# Patient Record
Sex: Female | Born: 1990 | Race: Black or African American | Hispanic: No | Marital: Single | Smoking: Current every day smoker
Health system: Southern US, Community
[De-identification: ages and names within clinical notes are randomized; demographics above are authoritative.]

## PROBLEM LIST (undated history)

## (undated) ENCOUNTER — Inpatient Hospital Stay (HOSPITAL_COMMUNITY): Payer: Self-pay

## (undated) ENCOUNTER — Inpatient Hospital Stay: Payer: Self-pay | Admitting: Maternal Newborn

## (undated) DIAGNOSIS — Z8619 Personal history of other infectious and parasitic diseases: Secondary | ICD-10-CM

## (undated) DIAGNOSIS — F1010 Alcohol abuse, uncomplicated: Secondary | ICD-10-CM

## (undated) DIAGNOSIS — N135 Crossing vessel and stricture of ureter without hydronephrosis: Secondary | ICD-10-CM

## (undated) DIAGNOSIS — R87619 Unspecified abnormal cytological findings in specimens from cervix uteri: Secondary | ICD-10-CM

## (undated) DIAGNOSIS — F1290 Cannabis use, unspecified, uncomplicated: Secondary | ICD-10-CM

## (undated) DIAGNOSIS — D696 Thrombocytopenia, unspecified: Secondary | ICD-10-CM

## (undated) DIAGNOSIS — N200 Calculus of kidney: Secondary | ICD-10-CM

## (undated) DIAGNOSIS — M5412 Radiculopathy, cervical region: Secondary | ICD-10-CM

## (undated) DIAGNOSIS — O1420 HELLP syndrome (HELLP), unspecified trimester: Secondary | ICD-10-CM

## (undated) DIAGNOSIS — D649 Anemia, unspecified: Secondary | ICD-10-CM

## (undated) DIAGNOSIS — I10 Essential (primary) hypertension: Secondary | ICD-10-CM

## (undated) DIAGNOSIS — F419 Anxiety disorder, unspecified: Secondary | ICD-10-CM

## (undated) DIAGNOSIS — G43909 Migraine, unspecified, not intractable, without status migrainosus: Secondary | ICD-10-CM

## (undated) DIAGNOSIS — F17210 Nicotine dependence, cigarettes, uncomplicated: Secondary | ICD-10-CM

## (undated) HISTORY — PX: ECTOPIC PREGNANCY SURGERY: SHX613

## (undated) HISTORY — PX: FRACTURE SURGERY: SHX138

## (undated) HISTORY — DX: Migraine, unspecified, not intractable, without status migrainosus: G43.909

## (undated) HISTORY — DX: Unspecified abnormal cytological findings in specimens from cervix uteri: R87.619

## (undated) HISTORY — DX: Thrombocytopenia, unspecified: D69.6

## (undated) HISTORY — DX: Personal history of other infectious and parasitic diseases: Z86.19

---
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
## 2011-03-08 ENCOUNTER — Emergency Department: Payer: Self-pay | Admitting: Emergency Medicine

## 2011-04-29 ENCOUNTER — Emergency Department: Payer: Self-pay | Admitting: Emergency Medicine

## 2011-05-11 ENCOUNTER — Inpatient Hospital Stay: Payer: Self-pay | Admitting: Surgery

## 2011-05-14 LAB — PATHOLOGY REPORT

## 2011-05-20 ENCOUNTER — Encounter: Payer: Self-pay | Admitting: Obstetrics and Gynecology

## 2011-06-01 ENCOUNTER — Encounter: Payer: Self-pay | Admitting: Pediatrics

## 2011-06-03 ENCOUNTER — Encounter: Payer: Self-pay | Admitting: Obstetrics and Gynecology

## 2011-06-15 ENCOUNTER — Encounter: Payer: Self-pay | Admitting: Pediatric Cardiology

## 2011-06-29 ENCOUNTER — Encounter: Payer: Self-pay | Admitting: Pediatrics

## 2011-06-30 ENCOUNTER — Observation Stay: Payer: Self-pay | Admitting: Obstetrics and Gynecology

## 2011-07-06 ENCOUNTER — Observation Stay: Payer: Self-pay | Admitting: Obstetrics and Gynecology

## 2011-07-12 DIAGNOSIS — D696 Thrombocytopenia, unspecified: Secondary | ICD-10-CM | POA: Insufficient documentation

## 2011-07-13 ENCOUNTER — Encounter: Payer: Self-pay | Admitting: Pediatrics

## 2011-07-19 ENCOUNTER — Encounter: Payer: Self-pay | Admitting: Maternal and Fetal Medicine

## 2011-07-27 ENCOUNTER — Encounter: Payer: Self-pay | Admitting: Pediatric Cardiology

## 2011-08-14 ENCOUNTER — Observation Stay: Payer: Self-pay

## 2011-08-15 ENCOUNTER — Ambulatory Visit: Payer: Self-pay

## 2011-08-26 ENCOUNTER — Encounter: Payer: Self-pay | Admitting: Obstetrics & Gynecology

## 2011-09-03 ENCOUNTER — Observation Stay: Payer: Self-pay

## 2011-09-17 ENCOUNTER — Observation Stay: Payer: Self-pay | Admitting: Obstetrics and Gynecology

## 2011-09-22 ENCOUNTER — Inpatient Hospital Stay: Payer: Self-pay

## 2012-01-15 ENCOUNTER — Emergency Department: Payer: Self-pay | Admitting: *Deleted

## 2012-05-29 ENCOUNTER — Emergency Department: Payer: Self-pay | Admitting: Emergency Medicine

## 2012-07-29 ENCOUNTER — Emergency Department: Payer: Self-pay | Admitting: Emergency Medicine

## 2012-07-29 LAB — COMPREHENSIVE METABOLIC PANEL WITH GFR
Albumin: 3.5 g/dL
Alkaline Phosphatase: 75 U/L
Anion Gap: 4 — ABNORMAL LOW
BUN: 17 mg/dL
Bilirubin,Total: 1 mg/dL
Calcium, Total: 8.3 mg/dL — ABNORMAL LOW
Chloride: 108 mmol/L — ABNORMAL HIGH
Co2: 26 mmol/L
Creatinine: 0.82 mg/dL
EGFR (African American): >60
EGFR (Non-African Amer.): >60
Glucose: 86 mg/dL
Osmolality: 277
Potassium: 4 mmol/L
SGOT(AST): 25 U/L
SGPT (ALT): 17 U/L
Sodium: 138 mmol/L
Total Protein: 7 g/dL

## 2012-07-29 LAB — CBC
HCT: 40.2 %
HGB: 13.7 g/dL
MCH: 29.9 pg
MCHC: 34.1 g/dL
MCV: 88 fL
Platelet: 131 x10 3/mm 3 — ABNORMAL LOW
RBC: 4.58 X10 6/mm 3
RDW: 12.7 %
WBC: 9.7 x10 3/mm 3

## 2012-07-29 LAB — URINALYSIS, COMPLETE
Bilirubin,UR: NEGATIVE
Blood: NEGATIVE
Ketone: NEGATIVE
Leukocyte Esterase: NEGATIVE
Nitrite: NEGATIVE
Ph: 6 (ref 4.5–8.0)
Protein: NEGATIVE
RBC,UR: 3 /HPF (ref 0–5)
WBC UR: 4 /HPF (ref 0–5)

## 2012-07-29 LAB — PREGNANCY, URINE: Pregnancy Test, Urine: NEGATIVE m[IU]/mL

## 2012-09-27 DIAGNOSIS — R87619 Unspecified abnormal cytological findings in specimens from cervix uteri: Secondary | ICD-10-CM

## 2012-09-27 DIAGNOSIS — Z8619 Personal history of other infectious and parasitic diseases: Secondary | ICD-10-CM

## 2012-09-27 HISTORY — DX: Personal history of other infectious and parasitic diseases: Z86.19

## 2012-09-27 HISTORY — DX: Unspecified abnormal cytological findings in specimens from cervix uteri: R87.619

## 2012-10-01 ENCOUNTER — Emergency Department: Payer: Self-pay | Admitting: Emergency Medicine

## 2012-10-01 LAB — URINALYSIS, COMPLETE
Bacteria: NONE SEEN
Bilirubin,UR: NEGATIVE
Blood: NEGATIVE
Glucose,UR: NEGATIVE mg/dL (ref 0–75)
Leukocyte Esterase: NEGATIVE
Ph: 6 (ref 4.5–8.0)
RBC,UR: 1 /HPF (ref 0–5)

## 2012-10-01 LAB — CBC
HCT: 40.6 % (ref 35.0–47.0)
MCH: 28.7 pg (ref 26.0–34.0)
MCV: 86 fL (ref 80–100)
RBC: 4.71 10*6/uL (ref 3.80–5.20)
RDW: 12.6 % (ref 11.5–14.5)

## 2012-10-01 LAB — COMPREHENSIVE METABOLIC PANEL
Alkaline Phosphatase: 76 U/L (ref 50–136)
BUN: 11 mg/dL (ref 7–18)
Bilirubin,Total: 0.9 mg/dL (ref 0.2–1.0)
Calcium, Total: 9 mg/dL (ref 8.5–10.1)
Chloride: 105 mmol/L (ref 98–107)
Co2: 26 mmol/L (ref 21–32)
EGFR (African American): >60
Potassium: 3.7 mmol/L (ref 3.5–5.1)

## 2012-10-01 LAB — WET PREP, GENITAL

## 2012-10-01 LAB — HCG, QUANTITATIVE, PREGNANCY: Beta Hcg, Quant.: 36174 m[IU]/mL — ABNORMAL HIGH

## 2012-10-24 ENCOUNTER — Emergency Department: Payer: Self-pay | Admitting: Emergency Medicine

## 2012-10-24 LAB — RAPID INFLUENZA A&B ANTIGENS

## 2012-10-24 LAB — CBC
HCT: 40.1 % (ref 35.0–47.0)
HGB: 13.4 g/dL (ref 12.0–16.0)
MCH: 28.7 pg (ref 26.0–34.0)
MCHC: 33.5 g/dL (ref 32.0–36.0)
RDW: 12.9 % (ref 11.5–14.5)
WBC: 6.1 10*3/uL (ref 3.6–11.0)

## 2012-10-24 LAB — COMPREHENSIVE METABOLIC PANEL
Anion Gap: 10 (ref 7–16)
BUN: 8 mg/dL (ref 7–18)
Calcium, Total: 9.1 mg/dL (ref 8.5–10.1)
Chloride: 104 mmol/L (ref 98–107)
Creatinine: 0.57 mg/dL — ABNORMAL LOW (ref 0.60–1.30)
EGFR (African American): >60
EGFR (Non-African Amer.): >60
Osmolality: 268 (ref 275–301)
Potassium: 3.8 mmol/L (ref 3.5–5.1)

## 2012-10-24 LAB — HCG, QUANTITATIVE, PREGNANCY: Beta Hcg, Quant.: 151966 m[IU]/mL — ABNORMAL HIGH

## 2012-10-26 LAB — BETA STREP CULTURE(ARMC)

## 2013-02-07 ENCOUNTER — Observation Stay: Payer: Self-pay | Admitting: Obstetrics & Gynecology

## 2013-02-07 LAB — URINALYSIS, COMPLETE
Bacteria: NONE SEEN
Bilirubin,UR: NEGATIVE
Blood: NEGATIVE
Nitrite: NEGATIVE
Ph: 7 (ref 4.5–8.0)
Protein: NEGATIVE
Specific Gravity: 1.033 (ref 1.003–1.030)
Squamous Epithelial: 3
WBC UR: 1 /HPF (ref 0–5)

## 2013-03-01 ENCOUNTER — Observation Stay: Payer: Self-pay | Admitting: Obstetrics and Gynecology

## 2013-03-01 LAB — URINALYSIS, COMPLETE
Bilirubin,UR: NEGATIVE
Glucose,UR: NEGATIVE mg/dL (ref 0–75)
Nitrite: NEGATIVE
Ph: 7 (ref 4.5–8.0)
Protein: NEGATIVE
Specific Gravity: 1.026 (ref 1.003–1.030)
WBC UR: 31 /HPF (ref 0–5)

## 2013-03-01 LAB — WET PREP, GENITAL

## 2013-04-25 ENCOUNTER — Observation Stay: Payer: Self-pay | Admitting: Obstetrics and Gynecology

## 2013-05-06 ENCOUNTER — Emergency Department: Payer: Self-pay | Admitting: Internal Medicine

## 2013-05-06 LAB — URINALYSIS, COMPLETE
Bilirubin,UR: NEGATIVE
Blood: NEGATIVE
Ketone: NEGATIVE
Nitrite: NEGATIVE
Ph: 7 (ref 4.5–8.0)
Protein: NEGATIVE
RBC,UR: <1 /HPF (ref 0–5)
Specific Gravity: 1.017 (ref 1.003–1.030)
Squamous Epithelial: 5

## 2013-05-16 ENCOUNTER — Inpatient Hospital Stay: Payer: Self-pay

## 2013-05-17 LAB — CBC WITH DIFFERENTIAL/PLATELET
Basophil %: 0.7 %
Eosinophil #: 0.1 10*3/uL (ref 0.0–0.7)
Eosinophil %: 0.5 %
HCT: 35.5 % (ref 35.0–47.0)
HGB: 12.4 g/dL (ref 12.0–16.0)
MCH: 31.1 pg (ref 26.0–34.0)
MCHC: 34.9 g/dL (ref 32.0–36.0)
Monocyte %: 5.7 %
Neutrophil #: 13.2 10*3/uL — ABNORMAL HIGH (ref 1.4–6.5)
RDW: 13.2 % (ref 11.5–14.5)

## 2013-05-17 LAB — GC/CHLAMYDIA PROBE AMP

## 2013-05-18 LAB — HEMATOCRIT: HCT: 33.2 % — ABNORMAL LOW (ref 35.0–47.0)

## 2013-07-23 ENCOUNTER — Emergency Department: Payer: Self-pay | Admitting: Emergency Medicine

## 2013-08-01 LAB — HM PAP SMEAR

## 2014-03-23 ENCOUNTER — Emergency Department: Payer: Self-pay | Admitting: Emergency Medicine

## 2014-03-23 LAB — CBC
HCT: 42.8 % (ref 35.0–47.0)
HGB: 13.9 g/dL (ref 12.0–16.0)
MCH: 29.8 pg (ref 26.0–34.0)
MCHC: 32.4 g/dL (ref 32.0–36.0)
MCV: 92 fL (ref 80–100)
Platelet: 94 10*3/uL — ABNORMAL LOW (ref 150–440)
RBC: 4.66 10*6/uL (ref 3.80–5.20)
RDW: 13.2 % (ref 11.5–14.5)
WBC: 9.5 10*3/uL (ref 3.6–11.0)

## 2014-05-27 ENCOUNTER — Emergency Department: Payer: Self-pay | Admitting: Student

## 2014-06-05 ENCOUNTER — Ambulatory Visit: Payer: Self-pay | Admitting: Specialist

## 2014-07-02 ENCOUNTER — Emergency Department: Payer: Self-pay | Admitting: Emergency Medicine

## 2014-07-02 LAB — CBC
HCT: 48 % — ABNORMAL HIGH (ref 35.0–47.0)
HGB: 15.8 g/dL (ref 12.0–16.0)
MCH: 29.6 pg (ref 26.0–34.0)
MCHC: 33 g/dL (ref 32.0–36.0)
MCV: 90 fL (ref 80–100)
Platelet: 103 10*3/uL — ABNORMAL LOW (ref 150–440)
RBC: 5.35 10*6/uL — ABNORMAL HIGH (ref 3.80–5.20)
RDW: 12.7 % (ref 11.5–14.5)
WBC: 6.5 10*3/uL (ref 3.6–11.0)

## 2014-07-02 LAB — URINALYSIS, COMPLETE
Blood: NEGATIVE
GLUCOSE, UR: NEGATIVE mg/dL (ref 0–75)
NITRITE: NEGATIVE
Ph: 6 (ref 4.5–8.0)
Protein: 25
RBC,UR: NONE SEEN /HPF (ref 0–5)
Specific Gravity: 1.02 (ref 1.003–1.030)

## 2014-07-02 LAB — COMPREHENSIVE METABOLIC PANEL
AST: 29 U/L (ref 15–37)
Albumin: 4 g/dL (ref 3.4–5.0)
Alkaline Phosphatase: 78 U/L
Anion Gap: 7 (ref 7–16)
BUN: 16 mg/dL (ref 7–18)
Bilirubin,Total: 0.6 mg/dL (ref 0.2–1.0)
CHLORIDE: 108 mmol/L — AB (ref 98–107)
CO2: 24 mmol/L (ref 21–32)
CREATININE: 0.91 mg/dL (ref 0.60–1.30)
Calcium, Total: 9.2 mg/dL (ref 8.5–10.1)
EGFR (African American): >60
Glucose: 100 mg/dL — ABNORMAL HIGH (ref 65–99)
Osmolality: 279 (ref 275–301)
Potassium: 3.5 mmol/L (ref 3.5–5.1)
SGPT (ALT): 20 U/L
Sodium: 139 mmol/L (ref 136–145)
Total Protein: 7.9 g/dL (ref 6.4–8.2)

## 2014-07-02 LAB — LIPASE, BLOOD: Lipase: 94 U/L (ref 73–393)

## 2014-07-04 LAB — URINE CULTURE

## 2014-09-25 ENCOUNTER — Emergency Department: Payer: Self-pay | Admitting: Emergency Medicine

## 2014-09-25 LAB — URINALYSIS, COMPLETE
BILIRUBIN, UR: NEGATIVE
Bacteria: NONE SEEN
Glucose,UR: NEGATIVE mg/dL (ref 0–75)
Ketone: NEGATIVE
Leukocyte Esterase: NEGATIVE
Nitrite: NEGATIVE
Ph: 6 (ref 4.5–8.0)
Protein: 30
RBC,UR: 1 /HPF (ref 0–5)
SPECIFIC GRAVITY: 1.017 (ref 1.003–1.030)
Squamous Epithelial: 1
WBC UR: 3 /HPF (ref 0–5)

## 2014-09-25 LAB — CBC WITH DIFFERENTIAL/PLATELET
BASOS PCT: 0.5 %
Basophil #: 0 10*3/uL (ref 0.0–0.1)
EOS PCT: 0.9 %
Eosinophil #: 0.1 10*3/uL (ref 0.0–0.7)
HCT: 38.4 % (ref 35.0–47.0)
HGB: 12.5 g/dL (ref 12.0–16.0)
LYMPHS PCT: 15.2 %
Lymphocyte #: 1.3 10*3/uL (ref 1.0–3.6)
MCH: 30.4 pg (ref 26.0–34.0)
MCHC: 32.6 g/dL (ref 32.0–36.0)
MCV: 93 fL (ref 80–100)
Monocyte #: 0.8 x10 3/mm (ref 0.2–0.9)
Monocyte %: 8.9 %
NEUTROS PCT: 74.5 %
Neutrophil #: 6.5 10*3/uL (ref 1.4–6.5)
Platelet: 96 10*3/uL — ABNORMAL LOW (ref 150–440)
RBC: 4.12 10*6/uL (ref 3.80–5.20)
RDW: 13.5 % (ref 11.5–14.5)
WBC: 8.7 10*3/uL (ref 3.6–11.0)

## 2014-09-25 LAB — COMPREHENSIVE METABOLIC PANEL
Albumin: 3.3 g/dL — ABNORMAL LOW (ref 3.4–5.0)
Alkaline Phosphatase: 58 U/L
Anion Gap: 5 — ABNORMAL LOW (ref 7–16)
BUN: 14 mg/dL (ref 7–18)
Bilirubin,Total: 0.8 mg/dL (ref 0.2–1.0)
CREATININE: 0.85 mg/dL (ref 0.60–1.30)
Calcium, Total: 8.5 mg/dL (ref 8.5–10.1)
Chloride: 113 mmol/L — ABNORMAL HIGH (ref 98–107)
Co2: 23 mmol/L (ref 21–32)
EGFR (Non-African Amer.): >60
GLUCOSE: 99 mg/dL (ref 65–99)
Osmolality: 282 (ref 275–301)
Potassium: 4 mmol/L (ref 3.5–5.1)
SGOT(AST): 12 U/L — ABNORMAL LOW (ref 15–37)
SGPT (ALT): 16 U/L
Sodium: 141 mmol/L (ref 136–145)
Total Protein: 7.1 g/dL (ref 6.4–8.2)

## 2014-09-25 LAB — LIPASE, BLOOD: Lipase: 94 U/L (ref 73–393)

## 2014-09-28 ENCOUNTER — Emergency Department: Payer: Self-pay | Admitting: Student

## 2014-09-28 LAB — CBC WITH DIFFERENTIAL/PLATELET
BASOS PCT: 0.4 %
Basophil #: 0 10*3/uL (ref 0.0–0.1)
Eosinophil #: 0.2 10*3/uL (ref 0.0–0.7)
Eosinophil %: 1.8 %
HCT: 36.6 % (ref 35.0–47.0)
HGB: 12 g/dL (ref 12.0–16.0)
Lymphocyte #: 1.4 10*3/uL (ref 1.0–3.6)
Lymphocyte %: 14 %
MCH: 30.1 pg (ref 26.0–34.0)
MCHC: 32.8 g/dL (ref 32.0–36.0)
MCV: 92 fL (ref 80–100)
MONOS PCT: 7.4 %
Monocyte #: 0.7 x10 3/mm (ref 0.2–0.9)
NEUTROS ABS: 7.5 10*3/uL — AB (ref 1.4–6.5)
Neutrophil %: 76.4 %
Platelet: 107 10*3/uL — ABNORMAL LOW (ref 150–440)
RBC: 3.98 10*6/uL (ref 3.80–5.20)
RDW: 13.3 % (ref 11.5–14.5)
WBC: 9.8 10*3/uL (ref 3.6–11.0)

## 2014-09-28 LAB — URINALYSIS, COMPLETE
BACTERIA: NONE SEEN
Bilirubin,UR: NEGATIVE
Glucose,UR: NEGATIVE mg/dL (ref 0–75)
Ketone: NEGATIVE
Nitrite: NEGATIVE
PH: 6 (ref 4.5–8.0)
SPECIFIC GRAVITY: 1.021 (ref 1.003–1.030)
Squamous Epithelial: 9

## 2014-09-28 LAB — COMPREHENSIVE METABOLIC PANEL
ALK PHOS: 59 U/L
AST: 12 U/L — AB (ref 15–37)
Albumin: 3.1 g/dL — ABNORMAL LOW (ref 3.4–5.0)
Anion Gap: 10 (ref 7–16)
BUN: 12 mg/dL (ref 7–18)
Bilirubin,Total: 0.5 mg/dL (ref 0.2–1.0)
CALCIUM: 8.6 mg/dL (ref 8.5–10.1)
Chloride: 107 mmol/L (ref 98–107)
Co2: 24 mmol/L (ref 21–32)
Creatinine: 0.78 mg/dL (ref 0.60–1.30)
EGFR (African American): >60
GLUCOSE: 96 mg/dL (ref 65–99)
OSMOLALITY: 281 (ref 275–301)
POTASSIUM: 3.3 mmol/L — AB (ref 3.5–5.1)
SGPT (ALT): 15 U/L
Sodium: 141 mmol/L (ref 136–145)
Total Protein: 6.8 g/dL (ref 6.4–8.2)

## 2014-09-28 LAB — LIPASE, BLOOD: Lipase: 84 U/L (ref 73–393)

## 2014-10-03 ENCOUNTER — Emergency Department: Payer: Self-pay | Admitting: Emergency Medicine

## 2014-10-03 LAB — COMPREHENSIVE METABOLIC PANEL
ALBUMIN: 3.1 g/dL — AB (ref 3.4–5.0)
ALK PHOS: 64 U/L
Anion Gap: 6 — ABNORMAL LOW (ref 7–16)
BUN: 12 mg/dL (ref 7–18)
Bilirubin,Total: 0.5 mg/dL (ref 0.2–1.0)
CALCIUM: 8.6 mg/dL (ref 8.5–10.1)
CREATININE: 0.83 mg/dL (ref 0.60–1.30)
Chloride: 107 mmol/L (ref 98–107)
Co2: 26 mmol/L (ref 21–32)
EGFR (African American): >60
EGFR (Non-African Amer.): >60
Glucose: 96 mg/dL (ref 65–99)
OSMOLALITY: 277 (ref 275–301)
Potassium: 3.5 mmol/L (ref 3.5–5.1)
SGOT(AST): 10 U/L — ABNORMAL LOW (ref 15–37)
SGPT (ALT): 12 U/L — ABNORMAL LOW
SODIUM: 139 mmol/L (ref 136–145)
TOTAL PROTEIN: 7.6 g/dL (ref 6.4–8.2)

## 2014-10-03 LAB — URINALYSIS, COMPLETE
Bacteria: NONE SEEN
Bilirubin,UR: NEGATIVE
Blood: NEGATIVE
Glucose,UR: NEGATIVE mg/dL (ref 0–75)
KETONE: NEGATIVE
NITRITE: NEGATIVE
PH: 5 (ref 4.5–8.0)
Specific Gravity: 1.032 (ref 1.003–1.030)
Squamous Epithelial: 10

## 2014-10-03 LAB — CBC WITH DIFFERENTIAL/PLATELET
BASOS ABS: 0 10*3/uL (ref 0.0–0.1)
Basophil %: 0.3 %
EOS ABS: 0.1 10*3/uL (ref 0.0–0.7)
Eosinophil %: 0.9 %
HCT: 36.3 % (ref 35.0–47.0)
HGB: 11.9 g/dL — ABNORMAL LOW (ref 12.0–16.0)
LYMPHS PCT: 16.9 %
Lymphocyte #: 1.4 10*3/uL (ref 1.0–3.6)
MCH: 30.1 pg (ref 26.0–34.0)
MCHC: 32.7 g/dL (ref 32.0–36.0)
MCV: 92 fL (ref 80–100)
MONO ABS: 0.8 x10 3/mm (ref 0.2–0.9)
Monocyte %: 9.5 %
Neutrophil #: 6.2 10*3/uL (ref 1.4–6.5)
Neutrophil %: 72.4 %
Platelet: 162 10*3/uL (ref 150–440)
RBC: 3.96 10*6/uL (ref 3.80–5.20)
RDW: 13.5 % (ref 11.5–14.5)
WBC: 8.6 10*3/uL (ref 3.6–11.0)

## 2014-10-03 LAB — LIPASE, BLOOD: LIPASE: 66 U/L — AB (ref 73–393)

## 2014-10-03 LAB — WET PREP, GENITAL

## 2014-10-04 LAB — GC/CHLAMYDIA PROBE AMP

## 2014-10-05 LAB — URINE CULTURE

## 2015-01-18 NOTE — Op Note (Signed)
PATIENT NAME:  Dominique Shea, Dominique Shea MR#:  [id number] DATE OF BIRTH:  [birth year]  DATE OF PROCEDURE:  06/05/2014  PREOPERATIVE DIAGNOSIS: Displaced rotated fracture of the proximal phalanx, right middle finger.   POSTOPERATIVE DIAGNOSIS: Displaced rotated fracture of the proximal phalanx, right middle finger.  OPERATION:  Open reduction internal fixation, proximal phalanx, right middle finger.   SURGEON: Valinda HoarHoward E. Courtnee Myer, MD   ANESTHESIA: General LMA.   COMPLICATIONS: None.   DRAINS: None.   ESTIMATED BLOOD LOSS: None.   DESCRIPTION OF PROCEDURE: The patient was brought to the Operating Room where she underwent satisfactory general LMA anesthesia in the supine position. The right hand and arm were prepped and draped in a sterile fashion. Esmarch was applied and the tourniquet inflated to 250 mmHg. Tourniquet time was 38 minutes. A longitudinal incision was made along the ulnar side of the proximal phalanx of the middle finger and extended dorsally and radially just distal to the joint. Dissection was carried out bluntly through subcutaneous tissue under loupe magnification. The fracture was identified and a small curette used to remove debris. The fracture was reduced and held with a clamp. Fluoroscopy showed satisfactory positioning with minimal shortening. Clinical exam showed the rotational deformity and an angulation had been corrected and there was good alignment in AP and lateral view in the AP plane. Two 2.0 Synthes screws were then inserted from lateral to medial through the fracture, stabilizing it.  Examination showed no movement at the fracture site. The fluoroscopy showed excellent position in AP plane. The wound was then irrigated and closed with 4-0 nylon. Marcaine 0.5% was placed in the hand for a digital block. Dry sterile dressing with a splint was applied. The tourniquet was deflated with good return of blood flow to the hand. The patient was awakened and taken to recovery in good  condition.    ____________________________ Valinda HoarHoward E. Maranatha Grossi, MD hem:lr D: 06/05/2014 12:27:43 ET T: 06/05/2014 13:02:44 ET JOB#: [id number]  cc: Valinda HoarHoward E. Nikolas Casher, MD, <Dictator> Valinda HoarHOWARD E Harleigh Civello MD ELECTRONICALLY SIGNED 06/06/2014 9:35

## 2015-02-04 NOTE — H&P (Signed)
L&D Evaluation:  History:  HPI 24 yo G3P1011 with EDC=05/24/2013 by a 7wk 6day ultrasound,  presented at 39 weeks with c/o contractions since 2200 last night. Her contractions were q3-4 min apart on arrival and after 2 hours of observation/ambulation, her contractions spaced out to q3-7 min and her cx changed from 2.5 to 3 cm. She now rates her pain as 6/10 and would like "something to help her rest."  Has had some bloody show. Denies SROM. She has had thrombocytopenia tis pregnancy, Prenatal care has been remarkable for mild thrombocytopenia ( no showed twice for a DP consult) with her 36 week plt count =124K. She has had nausea and vomiting with this pregnancy and has had a net weight loss of 18#.Treated for GC  on 6/5 with a negative TOC on 7/23 and 05/02/13. Received TDAP 04/04/13. LABS: A POS, VI, RI, GBS negative. OB HX: SVD 09/23/11, 7# female, complicated by thrombocytopenia and cholecystitis.   Presents with contractions   Patient's Medical History thrombocytopenia, migraines   Patient's Surgical History Cholecystectomy done during previous pregnancy   Medications Pre Natal Vitamins   Allergies NKDA   Social History none   Family History Non-Contributory   ROS:  ROS see HPI   Exam:  Vital Signs stable  111/62   Urine Protein not completed   General no apparent distress   Mental Status clear   Chest clear   Heart normal sinus rhythm, no murmur/gallop/rubs   Abdomen gravid, tender with contractions   Estimated Fetal Weight Average for gestational age   Fetal Position vtx   Edema no edema   Reflexes 1+   Pelvic no external lesions, 3/70%/-1 to -2   Mebranes Intact   FHT normal rate with no decels, 120 with accels to 140s to 150   FHT Description mod variability   Ucx q3-7 min   Skin dry   Impression:  Impression IUP at 39 weeks in early labor   Plan:  Plan EFM/NST, monitor contractions and for cervical change, Morphine 8 mgm and Phenergan 12.5 mgm IM    Electronic Signatures: Trinna BalloonGutierrez, Jaleyah Longhi L (CNM)  (Signed 21-Aug-14 02:56)  Authored: L&D Evaluation   Last Updated: 21-Aug-14 02:56 by Trinna BalloonGutierrez, Serafin Decatur L (CNM)

## 2015-02-04 NOTE — H&P (Signed)
L&D Evaluation:  History Expanded:  HPI 24 yo G3P1011 who had sex before arriving at the hospital.  she is 36 weeks, and she had sex this afternoon with a gush of fluid and then some cramping, She has had thrombocytopenia tis pregnancy,   Gravida 3   Term 1   PreTerm 0   Abortion 1   Living 1   Blood Type (Maternal) A positive   Group B Strep Results Maternal (Result >5wks must be treated as unknown) unknown/result > 5 weeks ago   Maternal HIV Negative   Maternal Syphilis Ab Nonreactive   Maternal Varicella Immune   Rubella Results (Maternal) immune   Maternal T-Dap Immune   [facility]   Presents with contractions   Patient's Medical History thrombocytopenia   Patient's Surgical History none   Medications Pre Natal Vitamins   Allergies NKDA   Social History none   Family History Non-Contributory   Current Prenatal Course Notable For thrombocytopenia   ROS:  ROS All systems were reviewed.  HEENT, CNS, GI, GU, Respiratory, CV, Renal and Musculoskeletal systems were found to be normal.   Exam:  Vital Signs stable   Urine Protein not completed   General no apparent distress   Mental Status clear   Chest clear   Heart normal sinus rhythm   Abdomen gravid, non-tender   Estimated Fetal Weight Average for gestational age   Fundal Height term   Back no CVAT   Pelvic no external lesions, 1/thick with mucus, oph neg, ni pooling no leajking seen.   Mebranes Intact   FHT normal rate with no decels   Ucx regular   Skin dry   Impression:  Impression early labor   Plan:  Plan EFM/NST, monitor contractions and for cervical change   Follow Up Appointment already scheduled   Electronic Signatures: Adria DevonKlett, Lorine Iannaccone (MD)  (Signed 30-Jul-14 23:49)  Authored: L&D Evaluation   Last Updated: 30-Jul-14 23:49 by Adria DevonKlett, Tiawanna Luchsinger (MD)

## 2015-02-04 NOTE — H&P (Signed)
L&D Evaluation:  History:  HPI G3P1011 at 247w0d gestational age by 8 week ultrasound.  Pregnancy complicated by multiple missed prenatal appointments and a history of thrombocytopenia in G1 for which she was referred to [facility] - she has missed two appointments with [facility] for this.  She presents today after noting a couple of blood spots on the tissue when wiping after urinating.  She has had no blood on her underwear and has had no other episodes. She notes positive fetal movement, no leakage of fluid, no contractions.  She denies urinary and vaginal symptoms.   A+ / RI / RPR NR / HBsAg neg/ VZI / HIV / initial plt count = 138 / GBS unknown.   her last appointment was 10 weeks ago. She did follow up for an ultrasound which was interpreted as normal for her anatomy scan.   Patient's Medical History migraines   Patient's Surgical History cholecystectomy   Medications Pre Natal Vitamins   Allergies NKDA   Social History none   Family History Non-Contributory   ROS:  ROS All systems were reviewed.  HEENT, CNS, GI, GU, Respiratory, CV, Renal and Musculoskeletal systems were found to be normal., unless noted by HPI   Exam:  Vital Signs stable   General no apparent distress   Mental Status clear   Chest clear   Heart normal sinus rhythm   Abdomen gravid, non-tender   Back no CVAT   Pelvic no external lesions, cervix closed and thick, No lesions, no old/brown or bright red blood in vaginal vault, no bleeding from cervical os. no lesions on cervical os.   Mebranes Intact   FHT normal rate with no decels   FHT Description 135/mod var/no accels/no decels   Ucx absent   Impression:  Impression vaginal bleeding   Plan:  Plan UA, EFM/NST, monitor contractions and for cervical change   Comments - highly recommend follow up ASAP for GDM screening/labs and [facility] - gonorrhea/chlamydia pending at time of discharge   Follow Up Appointment  need to schedule. ASAP. patient to request 1 hour gtt at time of her next appt   Labs:  Lab Results:  Routine Micro:  05-Jun-14 15:43   Micro Text Report WET PREP   COMMENT                   MANY WHITE BLOOD CELLS SEEN   COMMENT                   CLUE CELLS SEEN   COMMENT                   NO YEAST SEEN   COMMENT                   NO SPERMATOZOA SEEN   COMMENT                   NO TRICHOMONAS SEEN   ANTIBIOTIC                       Comment 1. MANY WHITE BLOOD CELLS SEEN  Comment 2. CLUE CELLS SEEN  Comment 3. NO YEAST SEEN  Comment 4. NO SPERMATOZOA SEEN  Comment 5. NO TRICHOMONAS SEEN  Result(s) reported on 01 Mar 2013 at 04:02PM.  Routine UA:  05-Jun-14 15:43   Color (UA) Yellow  Clarity (UA) Hazy  Glucose (UA) Negative  Bilirubin (UA) Negative  Ketones (UA) Trace  Specific Gravity (UA) 1.026  Blood (UA) Negative  pH (UA) 7.0  Protein (UA) Negative  Nitrite (UA) Negative  Leukocyte Esterase (UA) 1+ (Result(s) reported on 01 Mar 2013 at 04:27PM.)  RBC (UA) 3 /HPF  WBC (UA) 31 /HPF  Bacteria (UA) TRACE  Epithelial Cells (UA) 4 /HPF  Mucous (UA) PRESENT  Budding Yeast (UA) PRESENT (Result(s) reported on 01 Mar 2013 at 04:27PM.)   Electronic Signatures: Conard NovakJackson, Turner Kunzman D (MD)  (Signed 05-Jun-14 17:57)  Authored: L&D Evaluation, Labs   Last Updated: 05-Jun-14 17:57 by Conard NovakJackson, Tsuruko Murtha D (MD)

## 2015-02-14 ENCOUNTER — Emergency Department: Payer: Medicaid Other

## 2015-02-14 ENCOUNTER — Emergency Department
Admission: EM | Admit: 2015-02-14 | Discharge: 2015-02-14 | Disposition: A | Discharge status: Home / Self Care | Payer: Medicaid Other | Attending: Emergency Medicine | Admitting: Emergency Medicine

## 2015-02-14 ENCOUNTER — Encounter: Payer: Self-pay | Admitting: Emergency Medicine

## 2015-02-14 DIAGNOSIS — N938 Other specified abnormal uterine and vaginal bleeding: Secondary | ICD-10-CM | POA: Insufficient documentation

## 2015-02-14 DIAGNOSIS — R2231 Localized swelling, mass and lump, right upper limb: Secondary | ICD-10-CM | POA: Insufficient documentation

## 2015-02-14 DIAGNOSIS — Z87891 Personal history of nicotine dependence: Secondary | ICD-10-CM | POA: Diagnosis not present

## 2015-02-14 DIAGNOSIS — Z3202 Encounter for pregnancy test, result negative: Secondary | ICD-10-CM | POA: Diagnosis not present

## 2015-02-14 DIAGNOSIS — M25441 Effusion, right hand: Secondary | ICD-10-CM

## 2015-02-14 DIAGNOSIS — M2662 Arthralgia of temporomandibular joint: Secondary | ICD-10-CM | POA: Diagnosis not present

## 2015-02-14 DIAGNOSIS — M26629 Arthralgia of temporomandibular joint, unspecified side: Secondary | ICD-10-CM

## 2015-02-14 DIAGNOSIS — K088 Other specified disorders of teeth and supporting structures: Secondary | ICD-10-CM | POA: Diagnosis present

## 2015-02-14 LAB — URINALYSIS COMPLETE WITH MICROSCOPIC (ARMC ONLY)
Bacteria, UA: NONE SEEN
Bilirubin Urine: NEGATIVE
Glucose, UA: NEGATIVE mg/dL
KETONES UR: NEGATIVE mg/dL
Nitrite: NEGATIVE
PROTEIN: NEGATIVE mg/dL
Specific Gravity, Urine: 1.014 (ref 1.005–1.030)
pH: 6 (ref 5.0–8.0)

## 2015-02-14 LAB — CBC
HCT: 42.8 % (ref 35.0–47.0)
Hemoglobin: 14.2 g/dL (ref 12.0–16.0)
MCH: 30.2 pg (ref 26.0–34.0)
MCHC: 33.2 g/dL (ref 32.0–36.0)
MCV: 91.1 fL (ref 80.0–100.0)
Platelets: 92 10*3/uL — ABNORMAL LOW (ref 150–440)
RBC: 4.7 MIL/uL (ref 3.80–5.20)
RDW: 13.2 % (ref 11.5–14.5)
WBC: 8.1 10*3/uL (ref 3.6–11.0)

## 2015-02-14 LAB — POCT PREGNANCY, URINE: Preg Test, Ur: NEGATIVE

## 2015-02-14 NOTE — ED Notes (Signed)
Patient states she has h/o irregular periods that last for months. Last period lasted two months, went off approx one week ago. States she started another period on Monday with heavy bleeding and large clots. Patient also c/o dental pain to left lower side, as well as pain to right middle finger. States she broke right middle finger and had surgery, but is having pain (throbbing) again.

## 2015-02-14 NOTE — ED Notes (Signed)
Patient's pregnancy test is negative.

## 2015-02-14 NOTE — ED Notes (Signed)
Pt not in room at this time. PA attempted to enter room and pt was not present.

## 2015-02-14 NOTE — ED Provider Notes (Signed)
[facility] Emergency Department Provider Note ____________________________________________  Time seen: Approximately 11:19 AM  I have reviewed the triage vital signs and the nursing notes.   HISTORY  Chief Complaint Hand Pain; Vaginal Bleeding; and Dental Pain    HPI Dominique Shea is a 24 y.o. female here today with multiple complaints. First complaint consist of left jaw pain for approximately one week. Patient states she is having problems with her mouth with pain on the left side. Second complaint is third digit right hand which was surgically reduce secondary to a severe dislocation. There complaint is a history of irregular periods for the past 8 months. Patient said her irregular bleeding is secondary to her birth control implant is in her left arm. Patient is rating her pain as a 10 over 10 which she says in the left jaw and the right third finger. Patient state her pain is not relieved with Houston's sign no cough, mother's prescription. Patient has not followed up with all of though or GYN clinic for these 2 complaints.   History reviewed. No pertinent past medical history.  There are no active problems to display for this patient.   Past Surgical History  Procedure Laterality Date  . Fracture surgery    . Cholecystectomy      No current outpatient prescriptions on file.  Allergies Review of patient's allergies indicates no known allergies.  Family History  Problem Relation Age of Onset  . Hypertension Mother   . Migraines Mother   . Hypertension Father   . Migraines Father   . Migraines Sister   . Migraines Brother     Social History History  Substance Use Topics  . Smoking status: Former Games developermoker  . Smokeless tobacco: Never Used  . Alcohol Use: Yes     Comment: occ    Review of Systems Constitutional: No fever/chills Eyes: No visual changes. ENT: No sore throat. Cardiovascular: Denies chest pain. Respiratory: Denies  shortness of breath. Gastrointestinal: No abdominal pain.  No nausea, no vomiting.  No diarrhea.  No constipation. Genitourinary: Negative for dysuria. Musculoskeletal: Left TMJ and third digit right hand pain. Skin: Negative for rash. Neurological: Negative for headaches, focal weakness or numbness. 10-point ROS otherwise negative.  ____________________________________________   PHYSICAL EXAM:  VITAL SIGNS: ED Triage Vitals  Enc Vitals Group     BP 02/14/15 1024 128/84 mmHg     Pulse Rate 02/14/15 1024 65     Resp 02/14/15 1024 16     Temp 02/14/15 1024 98.2 F (36.8 C)     Temp Source 02/14/15 1024 Oral     SpO2 02/14/15 1024 100 %     Weight 02/14/15 1024 125 lb (56.7 kg)     Height 02/14/15 1024 5\' 4"  (1.626 m)     Head Cir --      Peak Flow --      Pain Score 02/14/15 1025 10     Pain Loc --      Pain Edu? --      Excl. in GC? --     Constitutional: Alert and oriented. Well appearing and in no acute distress. Eyes: Conjunctivae are normal. PERRL. EOMI. Head: Atraumatic. Nose: No congestion/rhinnorhea. Mouth/Throat: Current with palpation of the left TMJ.  Oropharynx non-erythematous. Neck: No stridor.  Neck is no deformity no guarding with palpation. Range of motion. Hematological/Lymphatic/Immunilogical: No cervical lymphadenopathy. Cardiovascular: Normal rate, regular rhythm. Grossly normal heart sounds.  Good peripheral circulation. Respiratory: Normal respiratory effort.  No  retractions. Lungs CTAB. Gastrointestinal: Soft and nontender. No distention. No abdominal bruits. No CVA tenderness. Genitourinary: Pelvic exam reveals no blood in the vaginal vault. Musculoskeletal: Obvious edema to the third digit right hand. Neurovascular intact with decreased range of motion with flexion of the MPJ. Neurologic:  Normal speech and language. No gross focal neurologic deficits are appreciated. Speech is normal. No gait instability. Skin:  Skin is warm, dry and intact. No  rash noted. Psychiatric: Mood and affect are normal. Speech and behavior are normal.  ____________________________________________   LABS (all labs ordered are listed, but only abnormal results are displayed)  Labs Reviewed  URINALYSIS COMPLETEWITH MICROSCOPIC ([facility])  - Abnormal; Notable for the following:    Color, Urine STRAW (*)    APPearance CLEAR (*)    Hgb urine dipstick 2+ (*)    Leukocytes, UA 1+ (*)    Squamous Epithelial / LPF 6-30 (*)    All other components within normal limits  CBC  POC URINE PREG, ED  POCT PREGNANCY, URINE   ____________________________________________  EKG   ____________________________________________  RADIOLOGY  Healed right 3rd finger fracture. ____________________________________________   PROCEDURES  Procedure(s) performed: None  Critical Care performed: No  ____________________________________________   INITIAL IMPRESSION / ASSESSMENT AND PLAN / ED COURSE  Pertinent labs & imaging results that were available during my care of the patient were reviewed by me and considered in my medical decision making (see chart for details).  Left TMJ pain. Right third finger pain. Dysfunctional uterine bleeding. ____________________________________________   FINAL CLINICAL IMPRESSION(S) / ED DIAGNOSES  Final diagnoses:  TMJ arthralgia  DUB (dysfunctional uterine bleeding)  Finger joint swelling, right    Patient elope  Joni ReiningRonald K Brooklin Rieger, PA-C 02/14/15 1301  Jene Everyobert Kinner, MD 02/14/15 [phone]

## 2015-02-14 NOTE — ED Notes (Signed)
PA Ron attempted to enter room again and pt has not returned.

## 2015-06-24 ENCOUNTER — Emergency Department
Admission: EM | Admit: 2015-06-24 | Discharge: 2015-06-24 | Disposition: A | Discharge status: Home / Self Care | Payer: Medicaid Other | Attending: Emergency Medicine | Admitting: Emergency Medicine

## 2015-06-24 ENCOUNTER — Emergency Department: Payer: Medicaid Other

## 2015-06-24 DIAGNOSIS — J029 Acute pharyngitis, unspecified: Secondary | ICD-10-CM | POA: Diagnosis not present

## 2015-06-24 DIAGNOSIS — R079 Chest pain, unspecified: Secondary | ICD-10-CM | POA: Diagnosis not present

## 2015-06-24 DIAGNOSIS — Z87891 Personal history of nicotine dependence: Secondary | ICD-10-CM | POA: Insufficient documentation

## 2015-06-24 DIAGNOSIS — R42 Dizziness and giddiness: Secondary | ICD-10-CM | POA: Diagnosis present

## 2015-06-24 LAB — CBC
HCT: 40.4 % (ref 35.0–47.0)
Hemoglobin: 13.2 g/dL (ref 12.0–16.0)
MCH: 29.1 pg (ref 26.0–34.0)
MCHC: 32.8 g/dL (ref 32.0–36.0)
MCV: 88.8 fL (ref 80.0–100.0)
PLATELETS: 103 10*3/uL — AB (ref 150–440)
RBC: 4.55 MIL/uL (ref 3.80–5.20)
RDW: 13.1 % (ref 11.5–14.5)
WBC: 7.9 10*3/uL (ref 3.6–11.0)

## 2015-06-24 LAB — BASIC METABOLIC PANEL
Anion gap: 6 (ref 5–15)
BUN: 9 mg/dL (ref 6–20)
CALCIUM: 9.1 mg/dL (ref 8.9–10.3)
CO2: 25 mmol/L (ref 22–32)
Chloride: 105 mmol/L (ref 101–111)
Creatinine, Ser: 0.74 mg/dL (ref 0.44–1.00)
GLUCOSE: 89 mg/dL (ref 65–99)
Potassium: 3.9 mmol/L (ref 3.5–5.1)
Sodium: 136 mmol/L (ref 135–145)

## 2015-06-24 LAB — TROPONIN I

## 2015-06-24 LAB — POCT PREGNANCY, URINE: PREG TEST UR: NEGATIVE

## 2015-06-24 MED ORDER — DEXAMETHASONE SODIUM PHOSPHATE 10 MG/ML IJ SOLN
10.0000 mg | Freq: Once | INTRAMUSCULAR | Status: AC
  Administered 2015-06-24: 10 mg via INTRAMUSCULAR

## 2015-06-24 MED ORDER — DEXAMETHASONE SODIUM PHOSPHATE 10 MG/ML IJ SOLN
INTRAMUSCULAR | Status: AC
  Filled 2015-06-24: qty 1

## 2015-06-24 NOTE — ED Notes (Signed)
Pt c/o dizziness with blurred vision, chest pain that started this morning..states she has had dizziness and fallen multiple times today.Marland Kitchen

## 2015-06-24 NOTE — ED Provider Notes (Signed)
[facility] Emergency Department Provider Note    ____________________________________________  Time seen: 1615  I have reviewed the triage vital signs and the nursing notes.   HISTORY  Chief Complaint Sore throat, chest pain, back pain, cough, lightheadedness  History limited by: Not Limited   HPI Dominique Shea is a 24 y.o. female who presents to the emergency department today with multiple medical complaints. Her initial complaint was for a sore throat. She stated it started this morning. She states it is been constant throughout the day. She has had associated back and chest pain. Again she states this is been throughout the day. She also complained of some shortness of breath although she states this has improved during the day. She states that the pain she is having a sharp. She states it is 9 out of 10. She denies any associated fevers. She does state that her infant child has also been sick recently with a cough.   History reviewed. No pertinent past medical history.  There are no active problems to display for this patient.   Past Surgical History  Procedure Laterality Date  . Fracture surgery    . Cholecystectomy      No current outpatient prescriptions on file.  Allergies Peanut-containing drug products and Tomato  Family History  Problem Relation Age of Onset  . Hypertension Mother   . Migraines Mother   . Hypertension Father   . Migraines Father   . Migraines Sister   . Migraines Brother     Social History Social History  Substance Use Topics  . Smoking status: Former Games developer  . Smokeless tobacco: Never Used  . Alcohol Use: Yes     Comment: occ    Review of Systems  Constitutional: Negative for fever. Cardiovascular: Positive for chest pain. Respiratory: Positive for shortness of breath. Gastrointestinal: Negative for abdominal pain, vomiting and diarrhea. Genitourinary: Negative for dysuria. Musculoskeletal:  Negative for back pain. Skin: Negative for rash. Neurological: Negative for headaches, focal weakness or numbness.  10-point ROS otherwise negative.  ____________________________________________   PHYSICAL EXAM:  VITAL SIGNS: ED Triage Vitals  Enc Vitals Group     BP 06/24/15 1448 124/81 mmHg     Pulse Rate 06/24/15 1448 82     Resp 06/24/15 1448 16     Temp 06/24/15 1448 97.6 F (36.4 C)     Temp Source 06/24/15 1448 Oral     SpO2 06/24/15 1448 99 %     Weight 06/24/15 1448 134 lb (60.782 kg)     Height 06/24/15 1448  (1.626 m)     Head Cir --      Peak Flow --      Pain Score 06/24/15 1449 9   Constitutional: Alert and oriented. Well appearing and in no distress. Eyes: Conjunctivae are normal. PERRL. Normal extraocular movements. ENT   Head: Normocephalic and atraumatic.   Nose: No congestion/rhinnorhea.   Mouth/Throat: Mucous membranes are moist. Pharynx with exudates and erythema. Uvula is midline.   Neck: No stridor. Hematological/Lymphatic/Immunilogical: No cervical lymphadenopathy. Cardiovascular: Normal rate, regular rhythm.  No murmurs, rubs, or gallops. Some tenderness to palpation of the anterior chest which patient states is similar to the pain she is been experiencing Respiratory: Normal respiratory effort without tachypnea nor retractions. Breath sounds are clear and equal bilaterally. No wheezes/rales/rhonchi. Gastrointestinal: Soft and nontender. No distention.  Genitourinary: Deferred Musculoskeletal: Normal range of motion in all extremities. No joint effusions.  No lower extremity tenderness nor edema.  Neurologic:  Normal speech and language. No gross focal neurologic deficits are appreciated. Speech is normal.  Skin:  Skin is warm, dry and intact. No rash noted. Psychiatric: Mood and affect are normal. Speech and behavior are normal. Patient exhibits appropriate insight and judgment.  ____________________________________________     LABS (pertinent positives/negatives)  Labs Reviewed  CBC - Abnormal; Notable for the following:    Platelets 103 (*)    All other components within normal limits  BASIC METABOLIC PANEL  TROPONIN I  POCT PREGNANCY, URINE     ____________________________________________   EKG  I, Phineas Semen, attending physician, personally viewed and interpreted this EKG  EKG Time: 1457 Rate: 79 Rhythm: NSr Axis: normal Intervals: qtc 431 QRS: narrow ST changes: no st elevation Impression: normal EKG   ____________________________________________    RADIOLOGY  CXR IMPRESSION: Negative chest.  I, GOODMAN, GRAYDON, personally viewed and evaluated these images (plain radiographs) as part of my medical decision making. ____________________________________________   PROCEDURES  Procedure(s) performed: None  Critical Care performed: No  ____________________________________________   INITIAL IMPRESSION / ASSESSMENT AND PLAN / ED COURSE  Pertinent labs & imaging results that were available during my care of the patient were reviewed by me and considered in my medical decision making (see chart for details).  Patient presented to the emergency department today with multiple medical symptoms. Patient's blood work chest x-ray and EKG without concerning findings. On physical exam patient did have signs of pharyngitis. Will plan on giving patient shot of Decadron here in the emergency department. Admission the patient's insurance chest pain was reproducible with palpation. I highly doubt ACS in this patient. Will plan on discharging  ____________________________________________   FINAL CLINICAL IMPRESSION(S) / ED DIAGNOSES  Final diagnoses:  Pharyngitis     Phineas Semen, MD 06/24/15 [phone]

## 2015-06-24 NOTE — Discharge Instructions (Signed)
Please seek medical attention for any high fevers, chest pain, shortness of breath, change in behavior, persistent vomiting, bloody stool or any other new or concerning symptoms. ° °Pharyngitis °Pharyngitis is redness, pain, and swelling (inflammation) of your pharynx.  °CAUSES  °Pharyngitis is usually caused by infection. Most of the time, these infections are from viruses (viral) and are part of a cold. However, sometimes pharyngitis is caused by bacteria (bacterial). Pharyngitis can also be caused by allergies. Viral pharyngitis may be spread from person to person by coughing, sneezing, and personal items or utensils (cups, forks, spoons, toothbrushes). Bacterial pharyngitis may be spread from person to person by more intimate contact, such as kissing.  °SIGNS AND SYMPTOMS  °Symptoms of pharyngitis include:   °· Sore throat.   °· Tiredness (fatigue).   °· Low-grade fever.   °· Headache. °· Joint pain and muscle aches. °· Skin rashes. °· Swollen lymph nodes. °· Plaque-like film on throat or tonsils (often seen with bacterial pharyngitis). °DIAGNOSIS  °Your health care provider will ask you questions about your illness and your symptoms. Your medical history, along with a physical exam, is often all that is needed to diagnose pharyngitis. Sometimes, a rapid strep test is done. Other lab tests may also be done, depending on the suspected cause.  °TREATMENT  °Viral pharyngitis will usually get better in 3-4 days without the use of medicine. Bacterial pharyngitis is treated with medicines that kill germs (antibiotics).  °HOME CARE INSTRUCTIONS  °· Drink enough water and fluids to keep your urine clear or pale yellow.   °· Only take over-the-counter or prescription medicines as directed by your health care provider:   °¨ If you are prescribed antibiotics, make sure you finish them even if you start to feel better.   °¨ Do not take aspirin.   °· Get lots of rest.   °· Gargle with 8 oz of salt water (½ tsp of salt per 1  qt of water) as often as every 1-2 hours to soothe your throat.   °· Throat lozenges (if you are not at risk for choking) or sprays may be used to soothe your throat. °SEEK MEDICAL CARE IF:  °· You have large, tender lumps in your neck. °· You have a rash. °· You cough up green, yellow-brown, or bloody spit. °SEEK IMMEDIATE MEDICAL CARE IF:  °· Your neck becomes stiff. °· You drool or are unable to swallow liquids. °· You vomit or are unable to keep medicines or liquids down. °· You have severe pain that does not go away with the use of recommended medicines. °· You have trouble breathing (not caused by a stuffy nose). °MAKE SURE YOU:  °· Understand these instructions. °· Will watch your condition. °· Will get help right away if you are not doing well or get worse. °Document Released: 09/13/2005 Document Revised: 07/04/2013 Document Reviewed: 05/21/2013 °ExitCare® Patient Information ©2015 [employer]. This information is not intended to replace advice given to you by your health care provider. Make sure you discuss any questions you have with your health care provider. ° °

## 2015-06-24 NOTE — ED Notes (Signed)
Pt here with multiple complaints.  Cough, chest pain, short of breath, sore throat, and lightheaded.  Pt advises her symptoms all started last night.  Pt ambulatory without problems.

## 2015-10-13 ENCOUNTER — Encounter: Payer: Self-pay | Admitting: *Deleted

## 2015-10-13 ENCOUNTER — Emergency Department
Admission: EM | Admit: 2015-10-13 | Discharge: 2015-10-13 | Disposition: A | Discharge status: Home / Self Care | Payer: Medicaid Other | Attending: Emergency Medicine | Admitting: Emergency Medicine

## 2015-10-13 DIAGNOSIS — Z87891 Personal history of nicotine dependence: Secondary | ICD-10-CM | POA: Insufficient documentation

## 2015-10-13 DIAGNOSIS — M62838 Other muscle spasm: Secondary | ICD-10-CM | POA: Insufficient documentation

## 2015-10-13 MED ORDER — METHOCARBAMOL 500 MG PO TABS: 500.0000 mg | ORAL_TABLET | Freq: Four times a day (QID) | ORAL | Status: DC

## 2015-10-13 MED ORDER — NAPROXEN 500 MG PO TABS: 500.0000 mg | ORAL_TABLET | Freq: Two times a day (BID) | ORAL | Status: DC

## 2015-10-13 NOTE — ED Notes (Signed)
Neck pain past week, hard to move neck

## 2015-10-13 NOTE — Discharge Instructions (Signed)
Muscle Cramps and Spasms °Muscle cramps and spasms occur when a muscle or muscles tighten and you have no control over this tightening (involuntary muscle contraction). They are a common problem and can develop in any muscle. The most common place is in the calf muscles of the leg. Both muscle cramps and muscle spasms are involuntary muscle contractions, but they also have differences:  °· Muscle cramps are sporadic and painful. They may last a few seconds to a quarter of an hour. Muscle cramps are often more forceful and last longer than muscle spasms. °· Muscle spasms may or may not be painful. They may also last just a few seconds or much longer. °CAUSES  °It is uncommon for cramps or spasms to be due to a serious underlying problem. In many cases, the cause of cramps or spasms is unknown. Some common causes are:  °· Overexertion.   °· Overuse from repetitive motions (doing the same thing over and over).   °· Remaining in a certain position for a long period of time.   °· Improper preparation, form, or technique while performing a sport or activity.   °· Dehydration.   °· Injury.   °· Side effects of some medicines.   °· Abnormally low levels of the salts and ions in your blood (electrolytes), especially potassium and calcium. This could happen if you are taking water pills (diuretics) or you are pregnant.   °Some underlying medical problems can make it more likely to develop cramps or spasms. These include, but are not limited to:  °· Diabetes.   °· Parkinson disease.   °· Hormone disorders, such as thyroid problems.   °· Alcohol abuse.   °· Diseases specific to muscles, joints, and bones.   °· Blood vessel disease where not enough blood is getting to the muscles.   °HOME CARE INSTRUCTIONS  °· Stay well hydrated. Drink enough water and fluids to keep your urine clear or pale yellow. °· It may be helpful to massage, stretch, and relax the affected muscle. °· For tight or tense muscles, use a warm towel, heating  pad, or hot shower water directed to the affected area. °· If you are sore or have pain after a cramp or spasm, applying ice to the affected area may relieve discomfort. °· Put ice in a plastic bag. °· Place a towel between your skin and the bag. °· Leave the ice on for 15-20 minutes, 03-04 times a day. °· Medicines used to treat a known cause of cramps or spasms may help reduce their frequency or severity. Only take over-the-counter or prescription medicines as directed by your caregiver. °SEEK MEDICAL CARE IF:  °Your cramps or spasms get more severe, more frequent, or do not improve over time.  °MAKE SURE YOU:  °· Understand these instructions. °· Will watch your condition. °· Will get help right away if you are not doing well or get worse. °  °This information is not intended to replace advice given to you by your health care provider. Make sure you discuss any questions you have with your health care provider. °  °Document Released: 03/05/2002 Document Revised: 01/08/2013 Document Reviewed: 08/30/2012 °Elsevier Interactive Patient Education ©2016 [employer] ° °Heat Therapy °Heat therapy can help ease sore, stiff, injured, and tight muscles and joints. Heat relaxes your muscles, which may help ease your pain.  °RISKS AND COMPLICATIONS °If you have any of the following conditions, do not use heat therapy unless your health care provider has approved: °· Poor circulation. °· Healing wounds or scarred skin in the area being treated. °·   Diabetes, heart disease, or high blood pressure. °· Not being able to feel (numbness) the area being treated. °· Unusual swelling of the area being treated. °· Active infections. °· Blood clots. °· Cancer. °· Inability to communicate pain. This may include young children and people who have problems with their brain function (dementia). °· Pregnancy. °Heat therapy should only be used on old, pre-existing, or long-lasting (chronic) injuries. Do not use heat therapy on new injuries  unless directed by your health care provider. °HOW TO USE HEAT THERAPY °There are several different kinds of heat therapy, including: °· Moist heat pack. °· Warm water bath. °· Hot water bottle. °· Electric heating pad. °· Heated gel pack. °· Heated wrap. °· Electric heating pad. °Use the heat therapy method suggested by your health care provider. Follow your health care provider's instructions on when and how to use heat therapy. °GENERAL HEAT THERAPY RECOMMENDATIONS °· Do not sleep while using heat therapy. Only use heat therapy while you are awake. °· Your skin may turn pink while using heat therapy. Do not use heat therapy if your skin turns red. °· Do not use heat therapy if you have new pain. °· High heat or long exposure to heat can cause burns. Be careful when using heat therapy to avoid burning your skin. °· Do not use heat therapy on areas of your skin that are already irritated, such as with a rash or sunburn. °SEEK MEDICAL CARE IF: °· You have blisters, redness, swelling, or numbness. °· You have new pain. °· Your pain is worse. °MAKE SURE YOU: °· Understand these instructions. °· Will watch your condition. °· Will get help right away if you are not doing well or get worse. °  °This information is not intended to replace advice given to you by your health care provider. Make sure you discuss any questions you have with your health care provider. °  °Document Released: 12/06/2011 Document Revised: 10/04/2014 Document Reviewed: 11/06/2013 °Elsevier Interactive Patient Education ©2016 Elsevier Inc. ° °

## 2015-10-13 NOTE — ED Provider Notes (Signed)
[facility] Emergency Department Provider Note  ____________________________________________  Time seen: Approximately 5:37 PM  I have reviewed the triage vital signs and the nursing notes.   HISTORY  Chief Complaint Torticollis    HPI Dominique Shea is a 25 y.o. female who presents to the emergency department complaining of left-sided neck pain 1 week. Patient states that she slept wrong week ago, was sleeping crossways on her bed with no pillow and "head tingling off dead." She woke up with a "crick in my neck." She states that she has had some intermittent pain over the past week as well as some mild stiffness. She states over the last 24-36 hours stiffness and pain has increased. No known injury prior to any symptoms. She denies any radicular symptoms. She denies any headache, visual acuity changes, chest pain, shortness of breath, abdominal pain, nausea or vomiting. She is tried Tylenol and ibuprofen with minimal relief.   History reviewed. No pertinent past medical history.  There are no active problems to display for this patient.   Past Surgical History  Procedure Laterality Date  . Fracture surgery    . Cholecystectomy      Current Outpatient Rx  Name  Route  Sig  Dispense  Refill  . methocarbamol (ROBAXIN) 500 MG tablet   Oral   Take 1 tablet (500 mg total) by mouth 4 (four) times daily.   16 tablet   0   . naproxen (NAPROSYN) 500 MG tablet   Oral   Take 1 tablet (500 mg total) by mouth 2 (two) times daily with a meal.   60 tablet   0     Allergies Peanut-containing drug products and Tomato  Family History  Problem Relation Age of Onset  . Hypertension Mother   . Migraines Mother   . Hypertension Father   . Migraines Father   . Migraines Sister   . Migraines Brother     Social History Social History  Substance Use Topics  . Smoking status: Former Games developermoker  . Smokeless tobacco: Never Used  . Alcohol Use: Yes   Comment: occ     Review of Systems  Constitutional: No fever/chills Eyes: No visual changes. No discharge ENT: No sore throat. Cardiovascular: no chest pain. Respiratory: no cough. No SOB. Gastrointestinal: No abdominal pain.  No nausea, no vomiting.  No diarrhea.  No constipation. Genitourinary: Negative for dysuria. No hematuria Musculoskeletal: Negative for back pain. Positive for left-sided neck pain 1 week. Skin: Negative for rash. Neurological: Negative for headaches, focal weakness or numbness. 10-point ROS otherwise negative.  ____________________________________________   PHYSICAL EXAM:  VITAL SIGNS: ED Triage Vitals  Enc Vitals Group     BP 10/13/15 1659 130/82 mmHg     Pulse Rate 10/13/15 1659 78     Resp 10/13/15 1659 20     Temp 10/13/15 1659 98.5 F (36.9 C)     Temp Source 10/13/15 1659 Oral     SpO2 10/13/15 1659 98 %     Weight 10/13/15 1659 140 lb (63.504 kg)     Height 10/13/15 1659 5\' 4"  (1.626 m)     Head Cir --      Peak Flow --      Pain Score 10/13/15 1700 9     Pain Loc --      Pain Edu? --      Excl. in GC? --      Constitutional: Alert and oriented. Well appearing and in no acute distress.  Eyes: Conjunctivae are normal. PERRL. EOMI. Head: Atraumatic. ENT:      Ears:       Nose: No congestion/rhinnorhea.      Mouth/Throat: Mucous membranes are moist.  Neck: No stridor.  No cervical spine tenderness to palpation. Limited left-sided rotation and limited neck flexion due to pain. Passive range of motion is full in all directions. Mild tenderness to palpation left-sided paraspinal muscle groups. Spasms are palpated in the lower left-sided cervical paraspinal muscle group. No other palpable abnormality. Sensation is appreciated bilateral upper extremities and is equal. Radial pulses are palpated bilateral upper extremities. Hematological/Lymphatic/Immunilogical: No cervical lymphadenopathy. Cardiovascular: Normal rate, regular rhythm. Normal  S1 and S2.  Good peripheral circulation. Respiratory: Normal respiratory effort without tachypnea or retractions. Lungs CTAB. Gastrointestinal: Soft and nontender. No distention. No CVA tenderness. Musculoskeletal: No lower extremity tenderness nor edema.  No joint effusions. Neurologic:  Normal speech and language. No gross focal neurologic deficits are appreciated.  Skin:  Skin is warm, dry and intact. No rash noted. Psychiatric: Mood and affect are normal. Speech and behavior are normal. Patient exhibits appropriate insight and judgement.   ____________________________________________   LABS (all labs ordered are listed, but only abnormal results are displayed)  Labs Reviewed - No data to display ____________________________________________  EKG   ____________________________________________  RADIOLOGY   No results found.  ____________________________________________    PROCEDURES  Procedure(s) performed:       Medications - No data to display   ____________________________________________   INITIAL IMPRESSION / ASSESSMENT AND PLAN / ED COURSE  Pertinent labs & imaging results that were available during my care of the patient were reviewed by me and considered in my medical decision making (see chart for details).  Patient's diagnosis is consistent with paraspinal muscle spasms. There is no history of injury precipitating these complaints. No imaging is ordered at this time.. Patient will be discharged home with prescriptions for anti-inflammatories for symptom control as well as muscle relaxers.. Patient is to follow up with primary care provider if symptoms persist past this treatment course. Patient is given ED precautions to return to the ED for any worsening or new symptoms.     ____________________________________________  FINAL CLINICAL IMPRESSION(S) / ED DIAGNOSES  Final diagnoses:  Cervical paraspinal muscle spasm      NEW MEDICATIONS  STARTED DURING THIS VISIT:  Discharge Medication List as of 10/13/2015  5:49 PM    START taking these medications   Details  methocarbamol (ROBAXIN) 500 MG tablet Take 1 tablet (500 mg total) by mouth 4 (four) times daily., Starting 10/13/2015, Until Discontinued, Print    naproxen (NAPROSYN) 500 MG tablet Take 1 tablet (500 mg total) by mouth 2 (two) times daily with a meal., Starting 10/13/2015, Until Discontinued, Print            Racheal Patches, PA-C 10/13/15 1900  Phineas Semen, MD 10/13/15 2018

## 2015-12-18 ENCOUNTER — Emergency Department
Admission: EM | Admit: 2015-12-18 | Discharge: 2015-12-18 | Disposition: A | Discharge status: Home / Self Care | Payer: Medicaid Other | Attending: Emergency Medicine | Admitting: Emergency Medicine

## 2015-12-18 DIAGNOSIS — Z79899 Other long term (current) drug therapy: Secondary | ICD-10-CM | POA: Insufficient documentation

## 2015-12-18 DIAGNOSIS — F17210 Nicotine dependence, cigarettes, uncomplicated: Secondary | ICD-10-CM | POA: Insufficient documentation

## 2015-12-18 DIAGNOSIS — G44209 Tension-type headache, unspecified, not intractable: Secondary | ICD-10-CM

## 2015-12-18 DIAGNOSIS — Z791 Long term (current) use of non-steroidal anti-inflammatories (NSAID): Secondary | ICD-10-CM | POA: Insufficient documentation

## 2015-12-18 MED ORDER — BUTALBITAL-APAP-CAFFEINE 50-325-40 MG PO TABS: 1.0000 | ORAL_TABLET | Freq: Four times a day (QID) | ORAL | Status: DC | PRN

## 2015-12-18 MED ORDER — IBUPROFEN 800 MG PO TABS: 800.0000 mg | ORAL_TABLET | Freq: Three times a day (TID) | ORAL | Status: DC | PRN

## 2015-12-18 MED ORDER — TRAMADOL HCL 50 MG PO TABS: 50.0000 mg | ORAL_TABLET | Freq: Four times a day (QID) | ORAL | Status: AC | PRN

## 2015-12-18 NOTE — Discharge Instructions (Signed)
Tension Headache A tension headache is a feeling of pain, pressure, or aching that is often felt over the front and sides of the head. The pain can be dull, or it can feel tight (constricting). Tension headaches are not normally associated with nausea or vomiting, and they do not get worse with physical activity. Tension headaches can last from 30 minutes to several days. This is the most common type of headache. CAUSES The exact cause of this condition is not known. Tension headaches often begin after stress, anxiety, or depression. Other triggers may include:  Alcohol.  Too much caffeine, or caffeine withdrawal.  Respiratory infections, such as colds, flu, or sinus infections.  Dental problems or teeth clenching.  Fatigue.  Holding your head and neck in the same position for a long period of time, such as while using a computer.  Smoking. SYMPTOMS Symptoms of this condition include:  A feeling of pressure around the head.  Dull, aching head pain.  Pain felt over the front and sides of the head.  Tenderness in the muscles of the head, neck, and shoulders. DIAGNOSIS This condition may be diagnosed based on your symptoms and a physical exam. Tests may be done, such as a CT scan or an MRI of your head. These tests may be done if your symptoms are severe or unusual. TREATMENT This condition may be treated with lifestyle changes and medicines to help relieve symptoms. HOME CARE INSTRUCTIONS Managing Pain  Take over-the-counter and prescription medicines only as told by your health care provider.  Lie down in a dark, quiet room when you have a headache.  If directed, apply ice to the head and neck area:  Put ice in a plastic bag.  Place a towel between your skin and the bag.  Leave the ice on for 20 minutes, 2-3 times per day.  Use a heating pad or a hot shower to apply heat to the head and neck area as told by your health care provider. Eating and Drinking  Eat meals on  a regular schedule.  Limit alcohol use.  Decrease your caffeine intake, or stop using caffeine. General Instructions  Keep all follow-up visits as told by your health care provider. This is important.  Keep a headache journal to help find out what may trigger your headaches. For example, write down:  What you eat and drink.  How much sleep you get.  Any change to your diet or medicines.  Try massage or other relaxation techniques.  Limit stress.  Sit up straight, and avoid tensing your muscles.  Do not use tobacco products, including cigarettes, chewing tobacco, or e-cigarettes. If you need help quitting, ask your health care provider.  Exercise regularly as told by your health care provider.  Get 7-9 hours of sleep, or the amount recommended by your health care provider. SEEK MEDICAL CARE IF:  Your symptoms are not helped by medicine.  You have a headache that is different from what you normally experience.  You have nausea or you vomit.  You have a fever. SEEK IMMEDIATE MEDICAL CARE IF:  Your headache becomes severe.  You have repeated vomiting.  You have a stiff neck.  You have a loss of vision.  You have problems with speech.  You have pain in your eye or ear.  You have muscular weakness or loss of muscle control.  You lose your balance or you have trouble walking.  You feel faint or you pass out.  You have confusion.     This information is not intended to replace advice given to you by your health care provider. Make sure you discuss any questions you have with your health care provider.   Document Released: 09/13/2005 Document Revised: 06/04/2015 Document Reviewed: 01/06/2015 Elsevier Interactive Patient Education 2016 Elsevier Inc.  

## 2015-12-18 NOTE — ED Provider Notes (Signed)
[facility] Emergency Department Provider Note  ____________________________________________  Time seen: Approximately 12:41 PM  I have reviewed the triage vital signs and the nursing notes.   HISTORY  Chief Complaint Headache    HPI Dominique Shea is a 25 y.o. female presents for evaluation of sudden onset of acute frontal headache. Patient was brought in by EMS from her workplace with complaints of a headache. Took 2 Aleve prior to arrival headache is now all but dissipated and gone. Initially was a 10 over 10, now rates it about a 2/10. States that she had some nausea and vomiting which is better light bothers her eyes a little bit but none now. Patient states that she is under a lot of stress and needs something for anxiety as well. States initially she felt like it was the worse headache of her life but now she doesn't feel like that. She desires a note to go back to work tomorrow. Patient is a one pack per day smoker.   History reviewed. No pertinent past medical history.  There are no active problems to display for this patient.   Past Surgical History  Procedure Laterality Date  . Fracture surgery    . Cholecystectomy      Current Outpatient Rx  Name  Route  Sig  Dispense  Refill  . butalbital-acetaminophen-caffeine (FIORICET) 50-325-40 MG tablet   Oral   Take 1-2 tablets by mouth every 6 (six) hours as needed for headache.   20 tablet   0   . ibuprofen (ADVIL,MOTRIN) 800 MG tablet   Oral   Take 1 tablet (800 mg total) by mouth every 8 (eight) hours as needed.   30 tablet   0   . methocarbamol (ROBAXIN) 500 MG tablet   Oral   Take 1 tablet (500 mg total) by mouth 4 (four) times daily.   16 tablet   0   . naproxen (NAPROSYN) 500 MG tablet   Oral   Take 1 tablet (500 mg total) by mouth 2 (two) times daily with a meal.   60 tablet   0     Allergies Peanut-containing drug products and Tomato  Family History  Problem Relation  Age of Onset  . Hypertension Mother   . Migraines Mother   . Hypertension Father   . Migraines Father   . Migraines Sister   . Migraines Brother     Social History Social History  Substance Use Topics  . Smoking status: Current Every Day Smoker    Types: Cigarettes  . Smokeless tobacco: Never Used  . Alcohol Use: Yes     Comment: occ    Review of Systems Constitutional: No fever/chills Eyes: No visual changes. ENT: No sore throat. Cardiovascular: Denies chest pain. Respiratory: Denies shortness of breath. Gastrointestinal: No abdominal pain.  No nausea, no vomiting.  No diarrhea.  No constipation. Genitourinary: Negative for dysuria. Musculoskeletal: Negative for back pain. Skin: Negative for rash. Neurological: Positive for headaches, negative focal weakness or numbness.  10-point ROS otherwise negative.  ____________________________________________   PHYSICAL EXAM:  VITAL SIGNS: ED Triage Vitals  Enc Vitals Group     BP 12/18/15 1131 120/75 mmHg     Pulse Rate 12/18/15 1131 64     Resp 12/18/15 1131 16     Temp 12/18/15 1131 97.9 F (36.6 C)     Temp Source 12/18/15 1131 Oral     SpO2 12/18/15 1131 99 %     Weight 12/18/15 1131 134  lb (60.782 kg)     Height 12/18/15 1131 5\' 4"  (1.626 m)     Head Cir --      Peak Flow --      Pain Score 12/18/15 1131 9     Pain Loc --      Pain Edu? --      Excl. in GC? --     Constitutional: Alert and oriented. Well appearing and in no acute distress. Eyes: Conjunctivae are normal. PERRL. EOMI. Head: Atraumatic. Nose: No congestion/rhinnorhea. Mouth/Throat: Mucous membranes are moist.  Oropharynx non-erythematous. Neck: No stridor.Full Range of motion nontender  Cardiovascular: Normal rate, regular rhythm. Grossly normal heart sounds.  Good peripheral circulation. Respiratory: Normal respiratory effort.  No retractions. Lungs CTAB. Musculoskeletal: No lower extremity tenderness nor edema.  No joint  effusions. Neurologic:  Normal speech and language. No gross focal neurologic deficits are appreciated. No gait instability. Skin:  Skin is warm, dry and intact. No rash noted. Psychiatric: Mood and affect are normal. Speech and behavior are normal.  ____________________________________________   LABS (all labs ordered are listed, but only abnormal results are displayed)  Labs Reviewed - No data to display    PROCEDURES  Procedure(s) performed: None  Critical Care performed: No  ____________________________________________   INITIAL IMPRESSION / ASSESSMENT AND PLAN / ED COURSE  Pertinent labs & imaging results that were available during my care of the patient were reviewed by me and considered in my medical decision making (see chart for details).  Acute vascular tension muscle contraction headache. Rx given for Fioricet and Motrin 800 mg 3 times a day. Work excuse given 24 hours patient PCP or return to ER with any worsening symptomology. ____________________________________________   FINAL CLINICAL IMPRESSION(S) / ED DIAGNOSES  Final diagnoses:  Tension-type headache, not intractable, unspecified chronicity pattern     This chart was dictated using voice recognition software/Dragon. Despite best efforts to proofread, errors can occur which can change the meaning. Any change was purely unintentional.   Evangeline Dakinharles M Beers, PA-C 12/18/15 1307  Emily FilbertJonathan E Williams, MD 12/18/15 [phone]

## 2015-12-18 NOTE — ED Notes (Signed)
States she developed frontal headache while at work this am  States has a slight hx of HTN  In past   But states headache has eased off slightly..Marland Kitchen

## 2015-12-18 NOTE — ED Notes (Addendum)
Pt comes into the ED via EMS from work with HA with Nausea since 10am.. States she took aleve without relief.

## 2016-04-08 ENCOUNTER — Emergency Department
Admission: EM | Admit: 2016-04-08 | Discharge: 2016-04-08 | Disposition: A | Discharge status: Home / Self Care | Payer: Medicaid Other | Attending: Emergency Medicine | Admitting: Emergency Medicine

## 2016-04-08 ENCOUNTER — Encounter: Payer: Self-pay | Admitting: Emergency Medicine

## 2016-04-08 DIAGNOSIS — Z202 Contact with and (suspected) exposure to infections with a predominantly sexual mode of transmission: Secondary | ICD-10-CM | POA: Diagnosis present

## 2016-04-08 DIAGNOSIS — A599 Trichomoniasis, unspecified: Secondary | ICD-10-CM | POA: Diagnosis not present

## 2016-04-08 DIAGNOSIS — Z791 Long term (current) use of non-steroidal anti-inflammatories (NSAID): Secondary | ICD-10-CM | POA: Insufficient documentation

## 2016-04-08 DIAGNOSIS — Z91010 Allergy to peanuts: Secondary | ICD-10-CM | POA: Insufficient documentation

## 2016-04-08 DIAGNOSIS — F17210 Nicotine dependence, cigarettes, uncomplicated: Secondary | ICD-10-CM | POA: Diagnosis not present

## 2016-04-08 DIAGNOSIS — Z91018 Allergy to other foods: Secondary | ICD-10-CM | POA: Diagnosis not present

## 2016-04-08 DIAGNOSIS — N760 Acute vaginitis: Secondary | ICD-10-CM | POA: Diagnosis not present

## 2016-04-08 DIAGNOSIS — Z79899 Other long term (current) drug therapy: Secondary | ICD-10-CM | POA: Insufficient documentation

## 2016-04-08 LAB — URINALYSIS COMPLETE WITH MICROSCOPIC (ARMC ONLY)
BILIRUBIN URINE: NEGATIVE
Bacteria, UA: NONE SEEN
Glucose, UA: NEGATIVE mg/dL
Hgb urine dipstick: NEGATIVE
KETONES UR: NEGATIVE mg/dL
NITRITE: NEGATIVE
PH: 6 (ref 5.0–8.0)
Protein, ur: NEGATIVE mg/dL
SPECIFIC GRAVITY, URINE: 1.023 (ref 1.005–1.030)

## 2016-04-08 LAB — POCT PREGNANCY, URINE: Preg Test, Ur: NEGATIVE

## 2016-04-08 MED ORDER — METRONIDAZOLE 500 MG PO TABS: 500.0000 mg | ORAL_TABLET | Freq: Two times a day (BID) | ORAL | Status: AC

## 2016-04-08 MED ORDER — METRONIDAZOLE 500 MG PO TABS
500.0000 mg | ORAL_TABLET | Freq: Once | ORAL | Status: AC
  Administered 2016-04-08: 500 mg via ORAL
  Filled 2016-04-08: qty 1

## 2016-04-08 MED ORDER — AZITHROMYCIN 500 MG PO TABS
1000.0000 mg | ORAL_TABLET | Freq: Once | ORAL | Status: AC
  Administered 2016-04-08: 1000 mg via ORAL
  Filled 2016-04-08: qty 2

## 2016-04-08 MED ORDER — CEFTRIAXONE SODIUM 250 MG IJ SOLR
250.0000 mg | Freq: Once | INTRAMUSCULAR | Status: AC
  Administered 2016-04-08: 250 mg via INTRAMUSCULAR
  Filled 2016-04-08: qty 250

## 2016-04-08 NOTE — ED Provider Notes (Signed)
[facility] Emergency Department Provider Note  ____________________________________________  Time seen: Approximately 6:10 PM  I have reviewed the triage vital signs and the nursing notes.   HISTORY  Chief Complaint Exposure to STD    HPI Dominique Shea is a 25 y.o. female reports 2 days of vaginal discharge. Also exposure to STD, Trichomonas. No abdominal pain, fevers or chills. No dysuria. No headache or nausea. No back pain.   History reviewed. No pertinent past medical history.  There are no active problems to display for this patient.   Past Surgical History  Procedure Laterality Date  . Fracture surgery    . Cholecystectomy      Current Outpatient Rx  Name  Route  Sig  Dispense  Refill  . butalbital-acetaminophen-caffeine (FIORICET) 50-325-40 MG tablet   Oral   Take 1-2 tablets by mouth every 6 (six) hours as needed for headache.   30 tablet   0   . metroNIDAZOLE (FLAGYL) 500 MG tablet   Oral   Take 1 tablet (500 mg total) by mouth 2 (two) times daily. Twice daily for one week   14 tablet   0   . naproxen (NAPROSYN) 500 MG tablet   Oral   Take 1 tablet (500 mg total) by mouth 2 (two) times daily with a meal.   60 tablet   0   . traMADol (ULTRAM) 50 MG tablet   Oral   Take 1 tablet (50 mg total) by mouth every 6 (six) hours as needed.   20 tablet   0     Allergies Peanut-containing drug products; Tomato; and Ibuprofen  Family History  Problem Relation Age of Onset  . Hypertension Mother   . Migraines Mother   . Hypertension Father   . Migraines Father   . Migraines Sister   . Migraines Brother     Social History Social History  Substance Use Topics  . Smoking status: Current Every Day Smoker    Types: Cigarettes  . Smokeless tobacco: Never Used  . Alcohol Use: Yes     Comment: occ    Review of Systems Constitutional: No fever/chills Eyes: No visual changes. ENT: No sore throat. Cardiovascular: Denies  chest pain. Respiratory: Denies shortness of breath. Gastrointestinal: No abdominal pain.  No nausea, no vomiting.  No diarrhea.  No constipation. Genitourinary: Negative for dysuria. Musculoskeletal: Negative for back pain. Skin: Negative for rash. Neurological: Negative for headaches, focal weakness or numbness. 10-point ROS otherwise negative.  ____________________________________________   PHYSICAL EXAM:  VITAL SIGNS: ED Triage Vitals  Enc Vitals Group     BP 04/08/16 1712 116/73 mmHg     Pulse Rate 04/08/16 1712 66     Resp 04/08/16 1712 18     Temp 04/08/16 1712 98 F (36.7 C)     Temp Source 04/08/16 1712 Oral     SpO2 04/08/16 1712 99 %     Weight 04/08/16 1712 135 lb (61.236 kg)     Height 04/08/16 1712 5\' 4"  (1.626 m)     Head Cir --      Peak Flow --      Pain Score --      Pain Loc --      Pain Edu? --      Excl. in GC? --     Constitutional: Alert and oriented. Well appearing and in no acute distress. Eyes: Conjunctivae are normal.  Neck:  Supple.  No adenopathy.   Cardiovascular: Normal rate, regular  rhythm. Grossly normal heart sounds.  Good peripheral circulation. Respiratory: Normal respiratory effort.  No retractions. Lungs CTAB. Gastrointestinal: Soft and nontender. No distention. No abdominal bruits. No CVA tenderness. Musculoskeletal: Nml ROM of upper and lower extremity joints. GU: declined. Neurologic:  Normal speech and language. No gross focal neurologic deficits are appreciated. No gait instability. Skin:  Skin is warm, dry and intact. No rash noted. Psychiatric: Mood and affect are normal. Speech and behavior are normal.  ____________________________________________   LABS (all labs ordered are listed, but only abnormal results are displayed)  Labs Reviewed  URINALYSIS COMPLETEWITH MICROSCOPIC ([facility] ONLY)  POC URINE PREG, ED  POCT PREGNANCY, URINE    ____________________________________________  EKG   ____________________________________________  RADIOLOGY   ____________________________________________   PROCEDURES  Procedure(s) performed: None  Critical Care performed: No  ____________________________________________   INITIAL IMPRESSION / ASSESSMENT AND PLAN / ED COURSE  Pertinent labs & imaging results that were available during my care of the patient were reviewed by me and considered in my medical decision making (see chart for details).  25 year old with STD exposure, Trichomonas. Declined female exam. Encouraged treatment for gonorrhea, chlamydia as well as Trichomonas. Given Rocephin, Zithromax and metronidazole. Patient can follow-up with the health department for further evaluation or return to emergency for any worsening symptoms. ____________________________________________   FINAL CLINICAL IMPRESSION(S) / ED DIAGNOSES  Final diagnoses:  STD exposure  Vaginitis      Ignacia Bayley, PA-C 04/08/16 1833  Ignacia Bayley, PA-C 04/08/16 1837  Sharman Cheek, MD 04/08/16 2315

## 2016-04-08 NOTE — ED Notes (Signed)
See triage note   States she developed some vaginal discharge about 3 days ago

## 2016-04-08 NOTE — Discharge Instructions (Signed)
Finish antibiotics as directed. No sexual contact for at least one week. [facility] for further [facility], or further testing for STDs.

## 2016-04-08 NOTE — ED Notes (Signed)
Pt states she has slept with someone in last week that has trich and states for last two days has had yellow discharge.

## 2016-09-28 ENCOUNTER — Encounter: Payer: Self-pay | Admitting: Emergency Medicine

## 2016-09-28 ENCOUNTER — Emergency Department
Admission: EM | Admit: 2016-09-28 | Discharge: 2016-09-28 | Disposition: A | Discharge status: Home / Self Care | Payer: Medicaid Other | Attending: Emergency Medicine | Admitting: Emergency Medicine

## 2016-09-28 ENCOUNTER — Emergency Department: Payer: Medicaid Other

## 2016-09-28 DIAGNOSIS — F17210 Nicotine dependence, cigarettes, uncomplicated: Secondary | ICD-10-CM | POA: Diagnosis not present

## 2016-09-28 DIAGNOSIS — R079 Chest pain, unspecified: Secondary | ICD-10-CM | POA: Diagnosis present

## 2016-09-28 DIAGNOSIS — Z91010 Allergy to peanuts: Secondary | ICD-10-CM | POA: Diagnosis not present

## 2016-09-28 DIAGNOSIS — Z79899 Other long term (current) drug therapy: Secondary | ICD-10-CM | POA: Insufficient documentation

## 2016-09-28 DIAGNOSIS — R0789 Other chest pain: Secondary | ICD-10-CM | POA: Diagnosis not present

## 2016-09-28 LAB — COMPREHENSIVE METABOLIC PANEL
ALK PHOS: 34 U/L — AB (ref 38–126)
ALT: 12 U/L — AB (ref 14–54)
AST: 21 U/L (ref 15–41)
Albumin: 3.8 g/dL (ref 3.5–5.0)
Anion gap: 5 (ref 5–15)
BUN: 24 mg/dL — ABNORMAL HIGH (ref 6–20)
CALCIUM: 8.9 mg/dL (ref 8.9–10.3)
CO2: 22 mmol/L (ref 22–32)
Chloride: 111 mmol/L (ref 101–111)
Creatinine, Ser: 1.11 mg/dL — ABNORMAL HIGH (ref 0.44–1.00)
Glucose, Bld: 92 mg/dL (ref 65–99)
Potassium: 4.8 mmol/L (ref 3.5–5.1)
Sodium: 138 mmol/L (ref 135–145)
TOTAL PROTEIN: 6.8 g/dL (ref 6.5–8.1)
Total Bilirubin: 1.5 mg/dL — ABNORMAL HIGH (ref 0.3–1.2)

## 2016-09-28 LAB — TROPONIN I

## 2016-09-28 LAB — CBC
HCT: 38.7 % (ref 35.0–47.0)
HEMOGLOBIN: 13.4 g/dL (ref 12.0–16.0)
MCH: 31.6 pg (ref 26.0–34.0)
MCHC: 34.6 g/dL (ref 32.0–36.0)
MCV: 91.3 fL (ref 80.0–100.0)
Platelets: 123 10*3/uL — ABNORMAL LOW (ref 150–440)
RBC: 4.24 MIL/uL (ref 3.80–5.20)
RDW: 13.4 % (ref 11.5–14.5)
WBC: 7 10*3/uL (ref 3.6–11.0)

## 2016-09-28 MED ORDER — LORAZEPAM 1 MG PO TABS
1.0000 mg | ORAL_TABLET | Freq: Once | ORAL | Status: AC
  Administered 2016-09-28: 1 mg via ORAL
  Filled 2016-09-28: qty 1

## 2016-09-28 NOTE — ED Provider Notes (Signed)
[facility] Emergency Department Provider Note   ____________________________________________    I have reviewed the triage vital signs and the nursing notes.   HISTORY  Chief Complaint Chest Pain and Panic Attack     HPI Dominique Shea is a 26 y.o. female who presents with complaints of chest pain. Patient reports the pain started approximately 6:30 this morning. She reports the pain is like a stabbing sensation underneath her left breast. She reports a history of panic attacks in the past but reports this feels different. She denies shortness of breath. No recent travel. No calf pain or swelling. She does have mild nausea.   History reviewed. No pertinent past medical history.  There are no active problems to display for this patient.   Past Surgical History:  Procedure Laterality Date  . CHOLECYSTECTOMY    . FRACTURE SURGERY      Prior to Admission medications   Medication Sig Start Date End Date Taking? Authorizing Provider  butalbital-acetaminophen-caffeine (FIORICET) 50-325-40 MG tablet Take 1-2 tablets by mouth every 6 (six) hours as needed for headache. 12/18/15   Evangeline Dakinharles M Beers, PA-C  naproxen (NAPROSYN) 500 MG tablet Take 1 tablet (500 mg total) by mouth 2 (two) times daily with a meal. 10/13/15   Delorise RoyalsJonathan D Cuthriell, PA-C  traMADol (ULTRAM) 50 MG tablet Take 1 tablet (50 mg total) by mouth every 6 (six) hours as needed. 12/18/15 12/17/16  Charmayne Sheerharles M Beers, PA-C     Allergies Peanut-containing drug products; Tomato; and Ibuprofen  Family History  Problem Relation Age of Onset  . Hypertension Mother   . Migraines Mother   . Hypertension Father   . Migraines Father   . Migraines Sister   . Migraines Brother     Social History Social History  Substance Use Topics  . Smoking status: Current Every Day Smoker    Types: Cigarettes  . Smokeless tobacco: Never Used  . Alcohol use Yes     Comment: occ    Review of  Systems  Constitutional: No fever/chills  Cardiovascular: As above Respiratory: Denies shortness of breath. Gastrointestinal: No abdominal pain.   Genitourinary: Negative for dysuria. Musculoskeletal: Negative for back pain.  Neurological: Negative for headaches   10-point ROS otherwise negative.  ____________________________________________   PHYSICAL EXAM:  VITAL SIGNS: ED Triage Vitals  Enc Vitals Group     BP 09/28/16 0856 (!) 133/96     Pulse Rate 09/28/16 0853 66     Resp 09/28/16 0853 16     Temp 09/28/16 0856 98.1 F (36.7 C)     Temp src --      SpO2 09/28/16 0853 100 %     Weight 09/28/16 0855 130 lb (59 kg)     Height 09/28/16 0855 5\' 4"  (1.626 m)     Head Circumference --      Peak Flow --      Pain Score 09/28/16 0855 9     Pain Loc --      Pain Edu? --      Excl. in GC? --     Constitutional: Alert and oriented. No acute distress. Eyes: Conjunctivae are normal.   Nose: No congestion/rhinnorhea. Mouth/Throat: Mucous membranes are moist.    Cardiovascular: Normal rate, regular rhythm. Grossly normal heart sounds.  Good peripheral circulation. Respiratory: Normal respiratory effort.  No retractions. Lungs CTAB. Gastrointestinal: Soft and nontender. No distention.  No CVA tenderness. Genitourinary: deferred Musculoskeletal: No lower extremity tenderness nor edema.  Warm and well perfused Neurologic:  Normal speech and language. No gross focal neurologic deficits are appreciated.  Skin:  Skin is warm, dry and intact. No rash noted.   ____________________________________________   LABS (all labs ordered are listed, but only abnormal results are displayed)  Labs Reviewed  CBC  COMPREHENSIVE METABOLIC PANEL  TROPONIN I   ____________________________________________  EKG  ED ECG REPORT I, Jene Every, the attending physician, personally viewed and interpreted this ECG.  Date: 09/28/2016  Rate: 67 Rhythm: normal sinus rhythm QRS Axis:  normal Intervals: normal ST/T Wave abnormalities: normal Conduction Disturbances: none Narrative Interpretation: unremarkable  ____________________________________________  RADIOLOGY  Chest x-ray unremarkable ____________________________________________   PROCEDURES  Procedure(s) performed: No    Critical Care performed: No ____________________________________________   INITIAL IMPRESSION / ASSESSMENT AND PLAN / ED COURSE  Pertinent labs & imaging results that were available during my care of the patient were reviewed by me and considered in my medical decision making (see chart for details).  Patient overall well-appearing and in no distress. History of present illness not consistent with ACS or PE nor myocarditis nor dissection, we will check labs, EKG and reevaluate.  Clinical Course   Patient's labs are unremarkable, suspect hemolysis for elevated bilirubin. She is well-appearing now and has no complaints. Suspect component of anxiety in her presentation ____________________________________________   FINAL CLINICAL IMPRESSION(S) / ED DIAGNOSES  Final diagnoses:  Atypical chest pain      NEW MEDICATIONS STARTED DURING THIS VISIT:  New Prescriptions   No medications on file     Note:  This document was prepared using Dragon voice recognition software and may include unintentional dictation errors.    Jene Every, MD 09/28/16 [phone]

## 2016-09-28 NOTE — ED Triage Notes (Signed)
Patient reports chest pain starting this morning. Reports history of similar episodes. States her boyfriend was driving her to hospital and called 911 and was informed to take pt to nearest fire dept. EMS reports patient was anxious upon their arrival. Patient has hx of anxiety but denies feeling anxious today. Patient also reports intermittent nausea with one episode of vomiting. Patient A&O x4.

## 2016-10-05 NOTE — Progress Notes (Deleted)
Hematology/Oncology Consult note [facility] [phone] [phone]  Patient Care Team: No Pcp Per Patient as PCP - General (General Practice)   Name of the patient: Dominique Shea  [id number]  1991/05/30    Reason for referral- thrombocytopenia   Referring physician- ***  Date of visit: 10/05/16   History of presenting illness- patient is a 26 year old female with a history of chronic thrombocytopenia. On review of her CBC from 2013 until present her platelet count mainly fluctuates between 90-120. She do have a normal white count and hemoglobin. More recently on 09/28/2016 her CBC showed white count of 7.0, H&H of 13.4/38.7 and a platelet count of 123. CMP showed a mildly elevated bilirubin of 1.5.  Trend of her platelet count is as follows:   Component     Latest Ref Rng & Units 10/24/2012 05/17/2013 03/23/2014 07/02/2014  WBC     3.6 - 11.0 K/uL 6.1 17.2 (H) 9.5 6.5  RBC     3.80 - 5.20 MIL/uL 4.68 3.99 4.66 5.35 (H)  HGB     12.0 - 16.0 g/dL 13.4 12.4 13.9 15.8  HCT     35.0 - 47.0 % 40.1 35.5 42.8 48.0 (H)  MCV     80.0 - 100.0 fL 86 89 92 90  MCH     26.0 - 34.0 pg 28.7 31.1 29.8 29.6  MCHC     32.0 - 36.0 g/dL 33.5 34.9 32.4 33.0  RDW     11.5 - 14.5 % 12.9 13.2 13.2 12.7  Platelets     150 - 440 K/uL 105 (L) 107 (L) 94 (L) 103 (L)   Component     Latest Ref Rng & Units 09/25/2014 09/28/2014 10/03/2014 02/14/2015  WBC     3.6 - 11.0 K/uL 8.7 9.8 8.6 8.1  RBC     3.80 - 5.20 MIL/uL 4.12 3.98 3.96 4.70  HGB     12.0 - 16.0 g/dL 12.5 12.0 11.9 (L)   HCT     35.0 - 47.0 % 38.4 36.6 36.3   MCV     80.0 - 100.0 fL 93 92 92 91.1  MCH     26.0 - 34.0 pg 30.4 30.1 30.1 30.2  MCHC     32.0 - 36.0 g/dL 32.6 32.8 32.7 33.2  RDW     11.5 - 14.5 % 13.5 13.3 13.5 13.2  Platelets     150 - 440 K/uL 96 (L) 107 (L) 162 92 (L)   Component     Latest Ref Rng & Units 06/24/2015 09/28/2016  WBC     3.6 - 11.0 K/uL 7.9 7.0  RBC  3.80 - 5.20 MIL/uL 4.55 4.24  HGB     12.0 - 16.0 g/dL    HCT     35.0 - 47.0 %    MCV     80.0 - 100.0 fL 88.8 91.3  MCH     26.0 - 34.0 pg 29.1 31.6  MCHC     32.0 - 36.0 g/dL 32.8 34.6  RDW     11.5 - 14.5 % 13.1 13.4  Platelets     150 - 440 K/uL 103 (L) 123 (L)    ECOG PS- ***  Pain scale- ***   Review of systems- Review of Systems  Constitutional: Negative for chills, fever, malaise/fatigue and weight loss.  HENT: Negative for congestion, ear discharge and nosebleeds.   Eyes: Negative for blurred vision.  Respiratory: Negative for cough, hemoptysis,  sputum production, shortness of breath and wheezing.   Cardiovascular: Negative for chest pain, palpitations, orthopnea and claudication.  Gastrointestinal: Negative for abdominal pain, blood in stool, constipation, diarrhea, heartburn, melena, nausea and vomiting.  Genitourinary: Negative for dysuria, flank pain, frequency, hematuria and urgency.  Musculoskeletal: Negative for back pain, joint pain and myalgias.  Skin: Negative for rash.  Neurological: Negative for dizziness, tingling, focal weakness, seizures, weakness and headaches.  Endo/Heme/Allergies: Does not bruise/bleed easily.  Psychiatric/Behavioral: Negative for depression and suicidal ideas. The patient does not have insomnia.     Allergies  Allergen Reactions  . Peanut-Containing Drug Products Anaphylaxis  . Tomato Anaphylaxis  . Ibuprofen Swelling    There are no active problems to display for this patient.    No past medical history on file.   Past Surgical History:  Procedure Laterality Date  . CHOLECYSTECTOMY    . FRACTURE SURGERY      Social History   Social History  . Marital status: Single    Spouse name: N/A  . Number of children: N/A  . Years of education: N/A   Occupational History  . Not on file.   Social History Main Topics  . Smoking status: Current Every Day Smoker    Types: Cigarettes  . Smokeless tobacco: Never Used   . Alcohol use Yes     Comment: occ  . Drug use: No  . Sexual activity: Yes    Birth control/ protection: Implant   Other Topics Concern  . Not on file   Social History Narrative  . No narrative on file     Family History  Problem Relation Age of Onset  . Hypertension Mother   . Migraines Mother   . Hypertension Father   . Migraines Father   . Migraines Sister   . Migraines Brother      Current Outpatient Prescriptions:  .  butalbital-acetaminophen-caffeine (FIORICET) 50-325-40 MG tablet, Take 1-2 tablets by mouth every 6 (six) hours as needed for headache., Disp: 30 tablet, Rfl: 0 .  naproxen (NAPROSYN) 500 MG tablet, Take 1 tablet (500 mg total) by mouth 2 (two) times daily with a meal., Disp: 60 tablet, Rfl: 0 .  traMADol (ULTRAM) 50 MG tablet, Take 1 tablet (50 mg total) by mouth every 6 (six) hours as needed., Disp: 20 tablet, Rfl: 0   Physical exam: There were no vitals filed for this visit. Physical Exam  Constitutional: She is oriented to person, place, and time and well-developed, well-nourished, and in no distress.  HENT:  Head: Normocephalic and atraumatic.  Eyes: EOM are normal. Pupils are equal, round, and reactive to light.  Neck: Normal range of motion.  Cardiovascular: Normal rate, regular rhythm and normal heart sounds.   Pulmonary/Chest: Effort normal and breath sounds normal.  Abdominal: Soft. Bowel sounds are normal.  Neurological: She is alert and oriented to person, place, and time.  Skin: Skin is warm and dry.       CMP Latest Ref Rng & Units 09/28/2016  Glucose 65 - 99 mg/dL 92  BUN 6 - 20 mg/dL 24(H)  Creatinine 0.44 - 1.00 mg/dL 1.11(H)  Sodium 135 - 145 mmol/L 138  Potassium 3.5 - 5.1 mmol/L 4.8  Chloride 101 - 111 mmol/L 111  CO2 22 - 32 mmol/L 22  Calcium 8.9 - 10.3 mg/dL 8.9  Total Protein 6.5 - 8.1 g/dL 6.8  Total Bilirubin 0.3 - 1.2 mg/dL 1.5(H)  Alkaline Phos 38 - 126 U/L 34(L)  AST 15 - 41  U/L 21  ALT 14 - 54 U/L 12(L)    CBC Latest Ref Rng & Units 09/28/2016  WBC 3.6 - 11.0 K/uL 7.0  Hemoglobin 12.0 - 16.0 g/dL 13.4  Hematocrit 35.0 - 47.0 % 38.7  Platelets 150 - 440 K/uL 123(L)    No images are attached to the encounter.  Dg Chest Port 1 View  Result Date: 09/28/2016 CLINICAL DATA:  Left side chest pain, nausea and vomit EXAM: PORTABLE CHEST 1 VIEW COMPARISON:  06/24/2015 FINDINGS: Cardiomediastinal silhouette is stable. No acute infiltrate or pleural effusion. No pulmonary edema. Bony thorax is unremarkable. IMPRESSION: No active disease. Electronically Signed   By: Lahoma Crocker M.D.   On: 09/28/2016 09:14    Assessment and plan- Patient is a 26 y.o. 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 Given that patient has chronic thrombocytopenia dating back to 2014 and has relatively remained stable between 95- 120 with no other cytopenias, I suspect this is secondary to ITP. However I will check a CBC with manual differential today. I will also ask pathology to review her peripheral smear. I will check for HIV and hepatitis C testing. I will also get stool H. pylori antigen testing. 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 She does not require a bone marrow biopsy at this time. I will see her back in 3 months time with a repeat CBC  Visit Diagnosis No diagnosis found.  Dr. Randa Evens, MD, MPH [place] [facility] Pager- [phone] 10/05/2016 12:47 PM

## 2016-10-07 ENCOUNTER — Inpatient Hospital Stay: Payer: Medicaid Other | Admitting: Oncology

## 2016-11-02 ENCOUNTER — Inpatient Hospital Stay: Payer: Medicaid Other | Admitting: Oncology

## 2016-11-02 NOTE — Progress Notes (Deleted)
Hematology/Oncology Consult note [facility] [phone] [phone]  Patient Care Team: No Pcp Per Patient as PCP - General (General Practice)   Name of the patient: Dominique Shea  [id number]  08/01/1991    Reason for referral- thrombocytopenia   Referring physician- Dr.Woukl  Date of visit: 11/02/16   History of presenting illness- Patient is a 26 yr old female with a h/o chronic thrombocytopenia atleast dating back to 2013. Her platelet counts have been ranging between 94-131. WBC and H/H have been normal. Most recent cbc on 09/28/16 showed wbc of 7, H/H 13.4/34.6 and platelet count of 123  ECOG PS- ***  Pain scale- ***   Review of systems- Review of Systems  Constitutional: Negative for chills, fever, malaise/fatigue and weight loss.  HENT: Negative for congestion, ear discharge and nosebleeds.   Eyes: Negative for blurred vision.  Respiratory: Negative for cough, hemoptysis, sputum production, shortness of breath and wheezing.   Cardiovascular: Negative for chest pain, palpitations, orthopnea and claudication.  Gastrointestinal: Negative for abdominal pain, blood in stool, constipation, diarrhea, heartburn, melena, nausea and vomiting.  Genitourinary: Negative for dysuria, flank pain, frequency, hematuria and urgency.  Musculoskeletal: Negative for back pain, joint pain and myalgias.  Skin: Negative for rash.  Neurological: Negative for dizziness, tingling, focal weakness, seizures, weakness and headaches.  Endo/Heme/Allergies: Does not bruise/bleed easily.  Psychiatric/Behavioral: Negative for depression and suicidal ideas. The patient does not have insomnia.     Allergies  Allergen Reactions  . Peanut-Containing Drug Products Anaphylaxis  . Tomato Anaphylaxis  . Ibuprofen Swelling    There are no active problems to display for this patient.    No past medical history on file.   Past Surgical History:  Procedure  Laterality Date  . CHOLECYSTECTOMY    . FRACTURE SURGERY      Social History   Social History  . Marital status: Single    Spouse name: N/A  . Number of children: N/A  . Years of education: N/A   Occupational History  . Not on file.   Social History Main Topics  . Smoking status: Current Every Day Smoker    Types: Cigarettes  . Smokeless tobacco: Never Used  . Alcohol use Yes     Comment: occ  . Drug use: No  . Sexual activity: Yes    Birth control/ protection: Implant   Other Topics Concern  . Not on file   Social History Narrative  . No narrative on file     Family History  Problem Relation Age of Onset  . Hypertension Mother   . Migraines Mother   . Hypertension Father   . Migraines Father   . Migraines Sister   . Migraines Brother      Current Outpatient Prescriptions:  .  butalbital-acetaminophen-caffeine (FIORICET) 50-325-40 MG tablet, Take 1-2 tablets by mouth every 6 (six) hours as needed for headache., Disp: 30 tablet, Rfl: 0 .  naproxen (NAPROSYN) 500 MG tablet, Take 1 tablet (500 mg total) by mouth 2 (two) times daily with a meal., Disp: 60 tablet, Rfl: 0 .  traMADol (ULTRAM) 50 MG tablet, Take 1 tablet (50 mg total) by mouth every 6 (six) hours as needed., Disp: 20 tablet, Rfl: 0   Physical exam: There were no vitals filed for this visit. Physical Exam  Constitutional: She is oriented to person, place, and time and well-developed, well-nourished, and in no distress.  HENT:  Head: Normocephalic and atraumatic.  Eyes: EOM are normal. Pupils  are equal, round, and reactive to light.  Neck: Normal range of motion.  Cardiovascular: Normal rate, regular rhythm and normal heart sounds.   Pulmonary/Chest: Effort normal and breath sounds normal.  Abdominal: Soft. Bowel sounds are normal.  Neurological: She is alert and oriented to person, place, and time.  Skin: Skin is warm and dry.       CMP Latest Ref Rng & Units 09/28/2016  Glucose 65 - 99 mg/dL  92  BUN 6 - 20 mg/dL 24(H)  Creatinine 0.44 - 1.00 mg/dL 1.11(H)  Sodium 135 - 145 mmol/L 138  Potassium 3.5 - 5.1 mmol/L 4.8  Chloride 101 - 111 mmol/L 111  CO2 22 - 32 mmol/L 22  Calcium 8.9 - 10.3 mg/dL 8.9  Total Protein 6.5 - 8.1 g/dL 6.8  Total Bilirubin 0.3 - 1.2 mg/dL 1.5(H)  Alkaline Phos 38 - 126 U/L 34(L)  AST 15 - 41 U/L 21  ALT 14 - 54 U/L 12(L)   CBC Latest Ref Rng & Units 09/28/2016  WBC 3.6 - 11.0 K/uL 7.0  Hemoglobin 12.0 - 16.0 g/dL 13.4  Hematocrit 35.0 - 47.0 % 38.7  Platelets 150 - 440 K/uL 123(L)    No images are attached to the encounter.  No results found.  Assessment and plan- Patient is a 26 y.o. female who has been referred to [place] for evaluation and management of chronic thrombocytopenia  1. We will check cbc with diff, HIV and Hepatitis C testing today. Check Pt, PTT< INR and pathology review of smear. I will see her back in 2 weeks time to discuss results of her tests. Given that she has isolated mild thrombocytopenia, this could be secondary to ITP and does not require further intervention such as bone marrow biopsy at this time. We can monitor it conservatively.   Thank you for this kind referral and the opportunity to participate in the care of this patient   Visit Diagnosis No diagnosis found.  Dr. Randa Evens, MD, MPH [place] [facility] Pager- [phone] 11/02/2016

## 2016-12-07 ENCOUNTER — Inpatient Hospital Stay: Payer: Medicaid Other | Attending: Oncology | Admitting: Oncology

## 2016-12-07 NOTE — Progress Notes (Deleted)
Hematology/Oncology Consult note [facility] [phone] [phone]  Patient Care Team: No Pcp Per Patient as PCP - General (General Practice)   Name of the patient: Dominique Shea  [id number]  [birth year]    Reason for referral- thrombocytopenia   Referring physician- Dr. Terald Sleeper  Date of visit: 12/07/16   History of presenting illness- patient is a 26 year old African-American female who was been referred to [place] for evaluation and management of thrombocytopenia. Looking back at his CBC from 2013 patient is always had a mild thrombocytopenia with platelet count between 90s to 130s. Most recently on 09/28/2016 her white count was 7, H&H was 13.4/38.7 and a platelet count of 123. She has not had other cytopenias and her white count and H&H been within normal limits.  ECOG PS- ***  Pain scale- ***   Review of systems- Review of Systems  Constitutional: Negative for chills, fever, malaise/fatigue and weight loss.  HENT: Negative for congestion, ear discharge and nosebleeds.   Eyes: Negative for blurred vision.  Respiratory: Negative for cough, hemoptysis, sputum production, shortness of breath and wheezing.   Cardiovascular: Negative for chest pain, palpitations, orthopnea and claudication.  Gastrointestinal: Negative for abdominal pain, blood in stool, constipation, diarrhea, heartburn, melena, nausea and vomiting.  Genitourinary: Negative for dysuria, flank pain, frequency, hematuria and urgency.  Musculoskeletal: Negative for back pain, joint pain and myalgias.  Skin: Negative for rash.  Neurological: Negative for dizziness, tingling, focal weakness, seizures, weakness and headaches.  Endo/Heme/Allergies: Does not bruise/bleed easily.  Psychiatric/Behavioral: Negative for depression and suicidal ideas. The patient does not have insomnia.     Allergies  Allergen Reactions  . Peanut-Containing Drug Products Anaphylaxis  . Tomato  Anaphylaxis  . Ibuprofen Swelling    There are no active problems to display for this patient.    No past medical history on file.   Past Surgical History:  Procedure Laterality Date  . CHOLECYSTECTOMY    . FRACTURE SURGERY      Social History   Social History  . Marital status: Single    Spouse name: N/A  . Number of children: N/A  . Years of education: N/A   Occupational History  . Not on file.   Social History Main Topics  . Smoking status: Current Every Day Smoker    Types: Cigarettes  . Smokeless tobacco: Never Used  . Alcohol use Yes     Comment: occ  . Drug use: No  . Sexual activity: Yes    Birth control/ protection: Implant   Other Topics Concern  . Not on file   Social History Narrative  . No narrative on file     Family History  Problem Relation Age of Onset  . Hypertension Mother   . Migraines Mother   . Hypertension Father   . Migraines Father   . Migraines Sister   . Migraines Brother      Current Outpatient Prescriptions:  .  butalbital-acetaminophen-caffeine (FIORICET) 50-325-40 MG tablet, Take 1-2 tablets by mouth every 6 (six) hours as needed for headache., Disp: 30 tablet, Rfl: 0 .  naproxen (NAPROSYN) 500 MG tablet, Take 1 tablet (500 mg total) by mouth 2 (two) times daily with a meal., Disp: 60 tablet, Rfl: 0 .  traMADol (ULTRAM) 50 MG tablet, Take 1 tablet (50 mg total) by mouth every 6 (six) hours as needed., Disp: 20 tablet, Rfl: 0   Physical exam: There were no vitals filed for this visit. Physical Exam  Constitutional: She  is oriented to person, place, and time and well-developed, well-nourished, and in no distress.  HENT:  Head: Normocephalic and atraumatic.  Eyes: EOM are normal. Pupils are equal, round, and reactive to light.  Neck: Normal range of motion.  Cardiovascular: Normal rate, regular rhythm and normal heart sounds.   Pulmonary/Chest: Effort normal and breath sounds normal.  Abdominal: Soft. Bowel sounds are  normal.  Neurological: She is alert and oriented to person, place, and time.  Skin: Skin is warm and dry.       CMP Latest Ref Rng & Units 09/28/2016  Glucose 65 - 99 mg/dL 92  BUN 6 - 20 mg/dL 24(H)  Creatinine 0.44 - 1.00 mg/dL 1.11(H)  Sodium 135 - 145 mmol/L 138  Potassium 3.5 - 5.1 mmol/L 4.8  Chloride 101 - 111 mmol/L 111  CO2 22 - 32 mmol/L 22  Calcium 8.9 - 10.3 mg/dL 8.9  Total Protein 6.5 - 8.1 g/dL 6.8  Total Bilirubin 0.3 - 1.2 mg/dL 1.5(H)  Alkaline Phos 38 - 126 U/L 34(L)  AST 15 - 41 U/L 21  ALT 14 - 54 U/L 12(L)   CBC Latest Ref Rng & Units 09/28/2016  WBC 3.6 - 11.0 K/uL 7.0  Hemoglobin 12.0 - 16.0 g/dL 13.4  Hematocrit 35.0 - 47.0 % 38.7  Platelets 150 - 440 K/uL 123(L)    No images are attached to the encounter.  No results found.  Assessment and plan- Patient is a 26 y.o. 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 However I will check a repeat CBC with differential, B12, folate, HIV and hepatitis C testing. I will also obtain stool H. pylori testing. I will obtain pathology review of her smear. I will see her back in about 2-3 weeks' time to discuss the results of her blood work. Patient does not need a bone marrow biopsy at this time   Thank you for this kind referral and the opportunity to participate in the care of this patient   Visit Diagnosis No diagnosis found.  Dr. Randa Evens, MD, MPH [place] at [facility] Pager- [phone] 12/07/2016

## 2017-02-21 ENCOUNTER — Emergency Department
Admission: EM | Admit: 2017-02-21 | Discharge: 2017-02-21 | Disposition: A | Discharge status: Home / Self Care | Payer: Medicaid Other | Attending: Emergency Medicine | Admitting: Emergency Medicine

## 2017-02-21 ENCOUNTER — Encounter: Payer: Self-pay | Admitting: Emergency Medicine

## 2017-02-21 ENCOUNTER — Emergency Department: Payer: Medicaid Other

## 2017-02-21 ENCOUNTER — Emergency Department
Admission: EM | Admit: 2017-02-21 | Discharge: 2017-02-21 | Disposition: A | Discharge status: Home / Self Care | Payer: Medicaid Other | Source: Home / Self Care | Attending: Emergency Medicine | Admitting: Emergency Medicine

## 2017-02-21 DIAGNOSIS — F17210 Nicotine dependence, cigarettes, uncomplicated: Secondary | ICD-10-CM | POA: Diagnosis not present

## 2017-02-21 DIAGNOSIS — T7840XA Allergy, unspecified, initial encounter: Secondary | ICD-10-CM

## 2017-02-21 DIAGNOSIS — N730 Acute parametritis and pelvic cellulitis: Secondary | ICD-10-CM

## 2017-02-21 DIAGNOSIS — R1030 Lower abdominal pain, unspecified: Secondary | ICD-10-CM

## 2017-02-21 LAB — URINALYSIS, COMPLETE (UACMP) WITH MICROSCOPIC
BILIRUBIN URINE: NEGATIVE
Bacteria, UA: NONE SEEN
GLUCOSE, UA: NEGATIVE mg/dL
Ketones, ur: NEGATIVE mg/dL
NITRITE: NEGATIVE
PROTEIN: NEGATIVE mg/dL
Specific Gravity, Urine: 1.013 (ref 1.005–1.030)
pH: 6 (ref 5.0–8.0)

## 2017-02-21 LAB — COMPREHENSIVE METABOLIC PANEL
ALT: 13 U/L — ABNORMAL LOW (ref 14–54)
ANION GAP: 5 (ref 5–15)
AST: 17 U/L (ref 15–41)
Albumin: 4.1 g/dL (ref 3.5–5.0)
Alkaline Phosphatase: 37 U/L — ABNORMAL LOW (ref 38–126)
BILIRUBIN TOTAL: 1.8 mg/dL — AB (ref 0.3–1.2)
BUN: 14 mg/dL (ref 6–20)
CO2: 24 mmol/L (ref 22–32)
Calcium: 9.3 mg/dL (ref 8.9–10.3)
Chloride: 109 mmol/L (ref 101–111)
Creatinine, Ser: 0.87 mg/dL (ref 0.44–1.00)
GFR calc Af Amer: >60 mL/min (ref >60)
GLUCOSE: 93 mg/dL (ref 65–99)
POTASSIUM: 3.7 mmol/L (ref 3.5–5.1)
Sodium: 138 mmol/L (ref 135–145)
TOTAL PROTEIN: 7.1 g/dL (ref 6.5–8.1)

## 2017-02-21 LAB — CBC
HEMATOCRIT: 40.6 % (ref 35.0–47.0)
Hemoglobin: 14 g/dL (ref 12.0–16.0)
MCH: 31.8 pg (ref 26.0–34.0)
MCHC: 34.4 g/dL (ref 32.0–36.0)
MCV: 92.4 fL (ref 80.0–100.0)
PLATELETS: 82 10*3/uL — AB (ref 150–440)
RBC: 4.4 MIL/uL (ref 3.80–5.20)
RDW: 12.7 % (ref 11.5–14.5)
WBC: 12.3 10*3/uL — ABNORMAL HIGH (ref 3.6–11.0)

## 2017-02-21 LAB — WET PREP, GENITAL
CLUE CELLS WET PREP: NONE SEEN
Sperm: NONE SEEN
TRICH WET PREP: NONE SEEN
Yeast Wet Prep HPF POC: NONE SEEN

## 2017-02-21 LAB — POCT PREGNANCY, URINE: Preg Test, Ur: NEGATIVE

## 2017-02-21 LAB — CHLAMYDIA/NGC RT PCR (ARMC ONLY)
Chlamydia Tr: NOT DETECTED
N gonorrhoeae: DETECTED — AB

## 2017-02-21 LAB — LIPASE, BLOOD: LIPASE: 27 U/L (ref 11–51)

## 2017-02-21 MED ORDER — IOPAMIDOL (ISOVUE-300) INJECTION 61%
100.0000 mL | Freq: Once | INTRAVENOUS | Status: AC | PRN
  Administered 2017-02-21: 100 mL via INTRAVENOUS

## 2017-02-21 MED ORDER — MORPHINE SULFATE (PF) 4 MG/ML IV SOLN
4.0000 mg | Freq: Once | INTRAVENOUS | Status: AC
  Administered 2017-02-21: 4 mg via INTRAVENOUS
  Filled 2017-02-21: qty 1

## 2017-02-21 MED ORDER — ONDANSETRON HCL 4 MG/2ML IJ SOLN
4.0000 mg | Freq: Once | INTRAMUSCULAR | Status: AC
  Administered 2017-02-21: 4 mg via INTRAVENOUS
  Filled 2017-02-21: qty 2

## 2017-02-21 MED ORDER — HYDROCODONE-ACETAMINOPHEN 5-325 MG PO TABS: 1.0000 | ORAL_TABLET | ORAL | 0 refills | Status: DC | PRN

## 2017-02-21 MED ORDER — DOXYCYCLINE HYCLATE 100 MG PO TABS: 100.0000 mg | ORAL_TABLET | Freq: Two times a day (BID) | ORAL | 0 refills | Status: DC

## 2017-02-21 MED ORDER — EPINEPHRINE 0.3 MG/0.3ML IJ SOAJ: 0.3000 mg | Freq: Once | INTRAMUSCULAR | 0 refills | Status: AC

## 2017-02-21 MED ORDER — EPINEPHRINE 0.3 MG/0.3ML IJ SOAJ
0.3000 mg | Freq: Once | INTRAMUSCULAR | Status: AC
  Administered 2017-02-21: 0.3 mg via INTRAMUSCULAR

## 2017-02-21 MED ORDER — HYDROCODONE-ACETAMINOPHEN 5-325 MG PO TABS
2.0000 | ORAL_TABLET | Freq: Once | ORAL | Status: AC
  Administered 2017-02-21: 2 via ORAL
  Filled 2017-02-21: qty 2

## 2017-02-21 MED ORDER — IOPAMIDOL (ISOVUE-300) INJECTION 61%
30.0000 mL | Freq: Once | INTRAVENOUS | Status: AC
  Administered 2017-02-21: 30 mL via ORAL

## 2017-02-21 MED ORDER — DOXYCYCLINE HYCLATE 100 MG PO TABS
100.0000 mg | ORAL_TABLET | Freq: Once | ORAL | Status: AC
  Administered 2017-02-21: 100 mg via ORAL
  Filled 2017-02-21: qty 1

## 2017-02-21 MED ORDER — PREDNISONE 20 MG PO TABS: 60.0000 mg | ORAL_TABLET | Freq: Every day | ORAL | 0 refills | Status: DC

## 2017-02-21 MED ORDER — DEXTROSE 5 % IV SOLN
250.0000 mg | Freq: Once | INTRAVENOUS | Status: AC
  Administered 2017-02-21: 250 mg via INTRAVENOUS
  Filled 2017-02-21: qty 250

## 2017-02-21 MED ORDER — METHYLPREDNISOLONE SODIUM SUCC 125 MG IJ SOLR
125.0000 mg | Freq: Once | INTRAMUSCULAR | Status: AC
  Administered 2017-02-21: 125 mg via INTRAVENOUS
  Filled 2017-02-21: qty 2

## 2017-02-21 NOTE — ED Notes (Signed)
Pt transported to [facility]

## 2017-02-21 NOTE — ED Notes (Signed)
Returned from U/S

## 2017-02-21 NOTE — Discharge Instructions (Signed)
Keep the EpiPen with you at all times if you have severe life-threatening allergic reaction as discussed, use it as indicated. At this time, we are unsure exactly which medication it was a caused her allergic reaction. It could've been ceftriaxone, which is the most likely, however doxycycline, hydrocodone/acetaminophen, IV dye, morphine, and Zofran were all also administered. Obviously, we would like you to avoid these medications as well and for this reason it is vital that she [facility]. We did talk to Dr. Threasa BeardsYang about your care and they would like to see you. Call for appointment. We will hold off on giving further antibiotics until you [facility]. If you have fever, increased pain or vomiting please return to the emergency department. Follow closely as well with your primary care doctor.

## 2017-02-21 NOTE — ED Triage Notes (Signed)
Pt has facial swelling and itching throat after receiving morphine, doxycycline and rocephin today.

## 2017-02-21 NOTE — Discharge Instructions (Addendum)
Please take your antibiotics as prescribed for their entire course. Please take pain medication as needed, as written. Return to the emergency department for any increased pain, fever, or any other symptom personally concerning to yourself. Otherwise please follow-up with your primary care doctor in 2 days for recheck/reevaluation.

## 2017-02-21 NOTE — ED Provider Notes (Addendum)
[facility] Emergency Department Provider Note  Time seen: 8:10 AM  I have reviewed the triage vital signs and the nursing notes.   HISTORY  Chief Complaint Vaginal Bleeding and Abdominal Pain    HPI Dominique Shea is a 26 y.o. female with no past medical history who presents to the emergency department for lower abdominal pain and vaginal bleeding. According to the patient for the past several months she has had daily vaginal bleeding ever since receiving the nexplanon birth control. Patient states over the past several days the bleeding has increased along with clots. She also states moderate lower abdominal pain which she describes as cramping over the past 2 days with nausea. Denies any fever, vomiting, diarrhea. She does state a headache but states this is chronic for her. Negative for dysuria. Heart negative review of systems otherwise.  History reviewed. No pertinent past medical history.  There are no active problems to display for this patient.   Past Surgical History:  Procedure Laterality Date  . CHOLECYSTECTOMY    . FRACTURE SURGERY      Prior to Admission medications   Medication Sig Start Date End Date Taking? Authorizing Provider  butalbital-acetaminophen-caffeine (FIORICET) 50-325-40 MG tablet Take 1-2 tablets by mouth every 6 (six) hours as needed for headache. 12/18/15   Beers, Charmayne Sheerharles M, PA-C  naproxen (NAPROSYN) 500 MG tablet Take 1 tablet (500 mg total) by mouth 2 (two) times daily with a meal. 10/13/15   Cuthriell, Delorise RoyalsJonathan D, PA-C    Allergies  Allergen Reactions  . Peanut-Containing Drug Products Anaphylaxis  . Tomato Anaphylaxis  . Ibuprofen Swelling    Family History  Problem Relation Age of Onset  . Hypertension Mother   . Migraines Mother   . Hypertension Father   . Migraines Father   . Migraines Sister   . Migraines Brother     Social History Social History  Substance Use Topics  . Smoking status: Current Every  Day Smoker    Types: Cigarettes  . Smokeless tobacco: Never Used  . Alcohol use Yes     Comment: occ    Review of Systems Constitutional: Negative for fever. Eyes: Negative for visual changes. ENT: Negative for congestion Cardiovascular: Negative for chest pain. Respiratory: Negative for shortness of breath. Gastrointestinal: Moderate lower abdominal pain. Positive for nausea. Genitourinary: Negative for dysuria. Positive for vaginal bleeding. Musculoskeletal: Negative for back pain. Skin: Negative for rash. Neurological: Negative for headache All other ROS negative  ____________________________________________   PHYSICAL EXAM:  VITAL SIGNS: ED Triage Vitals  Enc Vitals Group     BP 02/21/17 0744 (!) 140/99     Pulse Rate 02/21/17 0744 85     Resp 02/21/17 0744 18     Temp 02/21/17 0744 98 F (36.7 C)     Temp Source 02/21/17 0744 Oral     SpO2 02/21/17 0744 100 %     Weight 02/21/17 0744 161 lb (73 kg)     Height 02/21/17 0744 5\' 4"  (1.626 m)     Head Circumference --      Peak Flow --      Pain Score 02/21/17 0743 10     Pain Loc --      Pain Edu? --      Excl. in GC? --     Constitutional: Alert and oriented. Well appearing and in no distress. Eyes: Normal exam ENT   Head: Normocephalic and atraumatic.   Mouth/Throat: Mucous membranes are moist. Cardiovascular: Normal  rate, regular rhythm. No murmur Respiratory: Normal respiratory effort without tachypnea nor retractions. Breath sounds are clear Gastrointestinal: Soft, moderate suprapubic tenderness palpation. No rebound or guarding. No distention. Musculoskeletal: Nontender with normal range of motion in all extremities.  Neurologic:  Normal speech and language. No gross focal neurologic deficits  Skin:  Skin is warm, dry and intact.  Psychiatric: Mood and affect are normal.  ____________________________________________    RADIOLOGY  Ultrasound shows left ovarian cyst otherwise  normal.  ____________________________________________   INITIAL IMPRESSION / ASSESSMENT AND PLAN / ED COURSE  Pertinent labs & imaging results that were available during my care of the patient were reviewed by me and considered in my medical decision making (see chart for details).  The patient presents to the emergency department with suprapubic tenderness to palpation, lower abdominal pain, vaginal bleeding with clots. Patient states nearly daily bleeding since receiving her birth control placement several months ago. We will check labs, pelvic examination, and obtain a transvaginal ultrasound. Patient agreeable to plan.  Ultrasound is largely normal besides a left ovarian cyst. Patient's wet prep is negative besides moderate amount of white blood cells. Patient's lab work shows a slight leukocytosis of 12,000 otherwise negative. Printed test is negative. On pelvic examination the patient had diffuse tenderness to palpation including both adnexa and cervical motion tenderness. Given an otherwise normal workup of lower abdominal pain and cervical motion tenderness we will cover with Rocephin and doxycycline for possible PID. We will discharge with a short course of pain medication and have the patient follow with a primary care doctor.  Gonorrhea positive. Highly suspect pelvic inflammatory disease. We will dose Rocephin and doxycycline in the emergency department will discharge on the same. CT pending.  CT largely negative.  ____________________________________________   FINAL CLINICAL IMPRESSION(S) / ED DIAGNOSES  Abdominal cramping Vaginal bleeding Pelvic inflammatory disease   Minna Antis, MD 02/21/17 1034    Minna Antis, MD 02/21/17 1249

## 2017-02-21 NOTE — ED Notes (Signed)
Pt states the other day when she went to the bathroom she passed something which she describes was the size of her hand and is unsure if it was a blood clot or not. Pt states she has been having a period for 3 months and has Nexplanon in arm.  Pt states pain is 10/10 and describes the pain as stabbing.

## 2017-02-21 NOTE — ED Notes (Signed)
Patient transported to CT 

## 2017-02-21 NOTE — ED Notes (Signed)
MD at bedside for reassurance post medication and vomiting.

## 2017-02-21 NOTE — ED Triage Notes (Signed)
Pt reports vaginal bleeding for three months. Pt states "two days ago something about the size of her". Pt reports it looked like tissue but she does not know for sure. Pt states she has the Nexplanon implanted. Pt reports abdominal pain began after passing the tissue.

## 2017-02-21 NOTE — ED Provider Notes (Addendum)
[facility] Emergency Department Provider Note  ____________________________________________   I have reviewed the triage vital signs and the nursing notes.   HISTORY  Chief Complaint Allergic Reaction    HPI Dominique Shea is a 26 y.o. female with a history of multiple allergic reactions including she states to Benadryl although that was not initially listed. Patient was here with a PID evaluation with pelvic pain and gonorrhea/PID symptoms. She received multiple medications, and went home and had what she describes as another allergic reaction with facial swelling and itching. She denies shortness of breath or throat swelling. She has had multiple different reactions like this. She denies shortness of breath.   No past medical history on file.  There are no active problems to display for this patient.   Past Surgical History:  Procedure Laterality Date  . CHOLECYSTECTOMY    . FRACTURE SURGERY      Prior to Admission medications   Medication Sig Start Date End Date Taking? Authorizing Provider  butalbital-acetaminophen-caffeine (FIORICET) 50-325-40 MG tablet Take 1-2 tablets by mouth every 6 (six) hours as needed for headache. 12/18/15   Beers, Charmayne Sheer, PA-C  doxycycline (VIBRA-TABS) 100 MG tablet Take 1 tablet (100 mg total) by mouth 2 (two) times daily. 02/21/17   Minna Antis, MD  HYDROcodone-acetaminophen (NORCO/VICODIN) 5-325 MG tablet Take 1 tablet by mouth every 4 (four) hours as needed. 02/21/17   Minna Antis, MD  naproxen (NAPROSYN) 500 MG tablet Take 1 tablet (500 mg total) by mouth 2 (two) times daily with a meal. 10/13/15   Cuthriell, Delorise Royals, PA-C    Allergies Peanut-containing drug products; Tomato; and Ibuprofen  Family History  Problem Relation Age of Onset  . Hypertension Mother   . Migraines Mother   . Hypertension Father   . Migraines Father   . Migraines Sister   . Migraines Brother     Social  History Social History  Substance Use Topics  . Smoking status: Current Every Day Smoker    Types: Cigarettes  . Smokeless tobacco: Never Used  . Alcohol use Yes     Comment: occ    Review of Systems Constitutional: No fever/chills Eyes: No visual changes. ENT: No sore throat. No stiff neck no neck pain Cardiovascular: Denies chest pain. Respiratory: Denies shortness of breath. Gastrointestinal:   no vomiting.  No diarrhea.  No constipation. Genitourinary: Negative for dysuria. Musculoskeletal: Negative lower extremity swelling Skin: + for rash. Neurological: Negative for severe headaches, focal weakness or numbness.   ____________________________________________   PHYSICAL EXAM:  VITAL SIGNS: ED Triage Vitals  Enc Vitals Group     BP 02/21/17 1526 129/80     Pulse Rate 02/21/17 1526 83     Resp 02/21/17 1526 (!) 21     Temp 02/21/17 1526 98.3 F (36.8 C)     Temp Source 02/21/17 1526 Oral     SpO2 02/21/17 1526 100 %     Weight 02/21/17 1518 161 lb (73 kg)     Height 02/21/17 1518 5\' 4"  (1.626 m)     Head Circumference --      Peak Flow --      Pain Score 02/21/17 1518 0     Pain Loc --      Pain Edu? --      Excl. in GC? --     Constitutional: Alert and oriented. Well appearing and in no acute distress.Anxious and upset but nontoxic Eyes: Conjunctivae are normal Head: Atraumatic HEENT: No  congestion/rhinnorhea. Mucous membranes are moist.  Oropharynx non-erythematous, voice is normal, however there is diffuse swelling around the eyes noted, no stridor no angioedema Neck:   Nontender with no meningismus, no masses, no stridor Cardiovascular: Normal rate, regular rhythm. Grossly normal heart sounds.  Good peripheral circulation. Respiratory: Normal respiratory effort.  No retractions. Lungs CTAB. Abdominal: Soft and nontender. No distention. No guarding no rebound Back:  There is no focal tenderness or step off.  there is no midline tenderness there are no  lesions noted. there is no CVA tenderness Musculoskeletal: No lower extremity tenderness, no upper extremity tenderness. No joint effusions, no DVT signs strong distal pulses no edema Neurologic:  Normal speech and language. No gross focal neurologic deficits are appreciated.  Skin:  Skin is warm, dry and intact. He is hives noted Psychiatric: Mood and affect are normal. Speech and behavior are normal.  ____________________________________________   LABS (all labs ordered are listed, but only abnormal results are displayed)  Labs Reviewed - No data to display ____________________________________________  EKG  I personally interpreted any EKGs ordered by me or triage  ____________________________________________  RADIOLOGY  I reviewed any imaging ordered by me or triage that were performed during my shift and, if possible, patient and/or family made aware of any abnormal findings. ____________________________________________   PROCEDURES  Procedure(s) performed: None  Procedures  Critical Care performed: None  ____________________________________________   INITIAL IMPRESSION / ASSESSMENT AND PLAN / ED COURSE  Pertinent labs & imaging results that were available during my care of the patient were reviewed by me and considered in my medical decision making (see chart for details).  Patient here with an allergic reaction to one of the many substances she recently had for her pelvic pain/PID. Unclear which was he offending agent. Patient had IV contrast, as well as pain medications and antibiotics. All this was appropriate. Patient now is having allergic reaction, vital signs are reassuring and mostly this is diffuse facial swelling and hives. She states an unlikely but anaphylactic reaction to Benadryl. Accordingly, I will give her epinephrine and Solu-Medrol, I will avoid Pepcid because of her issue with histamine blockers and obviously will avoid Benadryl. The further question  is going to be how to treat her PID. First we will work on settling her allergic reaction and then we will work about trying to treat her underlying pathology with her host of known and possible allergies.  ----------------------------------------- 4:01 PM on 02/21/2017 -----------------------------------------  Pt feels much better swelling going down  ----------------------------------------- 4:36 PM on 02/21/2017 -----------------------------------------  Patient resting comfortably, hives have been gone, swelling is nearly gone, she has no ongoing complaints. Was able to get from the pharmacy list of medications that she received here this included ceftriaxone and doxycycline hydrocodone IV contrast dye, morphine and Zofran. There is really no way to tell which of these was the culprit as the allergic reaction happened after she went home. The patient's prescriptions for doxycycline and hydrocodone were not filled. At this point then we will have 2 active problems. The first is watching her allergic reaction was seems to responded very well to treatment; we will continue observing her here in the emergency room. The second problem is how to treat her presumed PID. She is positive for gonorrhea, fortunately she did receive her session and Doxy prior to going home. We will discuss with OB if they have any other suggestions about what they would like to possibly treat this patient with. She will need to follow-up  as well with an allergist. I have updated her allergy list to reflect the medications she got here. I have no way of telling which was the causative agent. Unfortunately they essentially include most classes of pain medications 2 classes of abx and ibp dye. I also added her benadryl allergy which she self reports is throat swelling.   ----------------------------------------- 6:22 PM on 02/21/2017 -----------------------------------------  Patient is a no signs or symptoms of allergic  reaction since the epinephrine and she is eager to go home. She has been here for several hours. She would prefer to be discharged at this time.  I did talk to Dr. Jean Rosenthal of OB/GYN, he will follow up with her in the next 2 days to ensure that she does not need any antibiotics. If she does, most likely azithromycin would be a good choice  She has no abdominal pain at this time.  We will ask you to for her pain medication pending of visit to the allergist  I did discuss with Dr. Threasa Beards at [facility] allergy, who agrees with the plan of discharge with steroids, holding off on these medications until he can test her, and EpiPen. They do feel that azithromycin might be a safe choice for the patient. I will send the patient home with a list of the medications that she was administered at their request so that they can evaluate the timing etc. According to the [facility] IR which I have printed, the last thing she got before discharge was a ceftriaxone and that is most likely the cause of her allergy. The contrast that she was administered happen several hours before discharge, as we did the initial doses of morphine and Zofran and she had no allergic reactions any of those things, was only after going home after the ceftriaxone IV. My strong suspicion this is a ceftriaxone allergy however, there is no way to know exactly which of these agents were the cause. Her allergic reaction started about 30 minutes after getting home. Patient very comfortable all this I have advised her not to take the pain medications of the doxycycline we'll give her information about follow-up with [facility] allergy as well as [facility]. Patient and family are eager to go home she has no complaints or symptoms and we will discharge her.  FINAL CLINICAL IMPRESSION(S) / ED DIAGNOSES  Final diagnoses:  None      This chart was dictated using voice recognition software.  Despite best efforts to proofread,  errors can occur which can change meaning.       Jeanmarie Plant, MD 02/21/17 1538    Jeanmarie Plant, MD 02/21/17 1604    Jeanmarie Plant, MD 02/21/17 1643    Jeanmarie Plant, MD 02/21/17 [phone]

## 2017-02-21 NOTE — ED Notes (Signed)
Pt ambulated to the bathroom, continues to c/o pain to the abdomen. Informed Dr. Lenard LancePaduchowski.

## 2017-02-23 ENCOUNTER — Ambulatory Visit: Payer: Self-pay | Admitting: Obstetrics and Gynecology

## 2017-03-09 ENCOUNTER — Ambulatory Visit: Payer: Self-pay | Admitting: Obstetrics and Gynecology

## 2017-03-18 ENCOUNTER — Encounter: Payer: Self-pay | Admitting: Emergency Medicine

## 2017-03-18 ENCOUNTER — Emergency Department
Admission: EM | Admit: 2017-03-18 | Discharge: 2017-03-18 | Disposition: A | Discharge status: Home / Self Care | Payer: Medicaid Other | Attending: Emergency Medicine | Admitting: Emergency Medicine

## 2017-03-18 ENCOUNTER — Emergency Department: Payer: Medicaid Other

## 2017-03-18 DIAGNOSIS — F17210 Nicotine dependence, cigarettes, uncomplicated: Secondary | ICD-10-CM | POA: Insufficient documentation

## 2017-03-18 DIAGNOSIS — F419 Anxiety disorder, unspecified: Secondary | ICD-10-CM | POA: Diagnosis not present

## 2017-03-18 DIAGNOSIS — R079 Chest pain, unspecified: Secondary | ICD-10-CM

## 2017-03-18 DIAGNOSIS — R1013 Epigastric pain: Secondary | ICD-10-CM | POA: Insufficient documentation

## 2017-03-18 DIAGNOSIS — R112 Nausea with vomiting, unspecified: Secondary | ICD-10-CM | POA: Insufficient documentation

## 2017-03-18 DIAGNOSIS — A09 Infectious gastroenteritis and colitis, unspecified: Secondary | ICD-10-CM | POA: Diagnosis not present

## 2017-03-18 LAB — CBC
HCT: 41.7 % (ref 35.0–47.0)
Hemoglobin: 14.3 g/dL (ref 12.0–16.0)
MCH: 31.2 pg (ref 26.0–34.0)
MCHC: 34.3 g/dL (ref 32.0–36.0)
MCV: 91 fL (ref 80.0–100.0)
Platelets: 78 10*3/uL — ABNORMAL LOW (ref 150–440)
RBC: 4.58 MIL/uL (ref 3.80–5.20)
RDW: 12.9 % (ref 11.5–14.5)
WBC: 7.5 10*3/uL (ref 3.6–11.0)

## 2017-03-18 LAB — BASIC METABOLIC PANEL
ANION GAP: 6 (ref 5–15)
BUN: 15 mg/dL (ref 6–20)
CALCIUM: 8.6 mg/dL — AB (ref 8.9–10.3)
CO2: 20 mmol/L — ABNORMAL LOW (ref 22–32)
Chloride: 109 mmol/L (ref 101–111)
Creatinine, Ser: 0.75 mg/dL (ref 0.44–1.00)
GFR calc Af Amer: >60 mL/min (ref >60)
GLUCOSE: 97 mg/dL (ref 65–99)
Potassium: 3.4 mmol/L — ABNORMAL LOW (ref 3.5–5.1)
SODIUM: 135 mmol/L (ref 135–145)

## 2017-03-18 LAB — FIBRIN DERIVATIVES D-DIMER (ARMC ONLY): FIBRIN DERIVATIVES D-DIMER (ARMC): 167.52 (ref 0.00–499.00)

## 2017-03-18 LAB — TROPONIN I

## 2017-03-18 MED ORDER — MORPHINE SULFATE (PF) 2 MG/ML IV SOLN
INTRAVENOUS | Status: AC
  Filled 2017-03-18: qty 1

## 2017-03-18 MED ORDER — LORAZEPAM 2 MG/ML IJ SOLN
1.0000 mg | Freq: Once | INTRAMUSCULAR | Status: AC
  Administered 2017-03-18: 1 mg via INTRAVENOUS

## 2017-03-18 MED ORDER — SODIUM CHLORIDE 0.9 % IV BOLUS (SEPSIS)
1000.0000 mL | Freq: Once | INTRAVENOUS | Status: AC
  Administered 2017-03-18: 1000 mL via INTRAVENOUS

## 2017-03-18 MED ORDER — ONDANSETRON HCL 4 MG/2ML IJ SOLN
INTRAMUSCULAR | Status: AC
  Filled 2017-03-18: qty 2

## 2017-03-18 MED ORDER — METOCLOPRAMIDE HCL 5 MG/ML IJ SOLN
10.0000 mg | Freq: Once | INTRAMUSCULAR | Status: AC
  Administered 2017-03-18: 10 mg via INTRAVENOUS
  Filled 2017-03-18: qty 2

## 2017-03-18 MED ORDER — LORAZEPAM 2 MG/ML IJ SOLN
INTRAMUSCULAR | Status: AC
  Administered 2017-03-18: 1 mg via INTRAVENOUS
  Filled 2017-03-18: qty 1

## 2017-03-18 NOTE — ED Notes (Signed)
Patient sleeping quietly in NAD at this time.

## 2017-03-18 NOTE — ED Provider Notes (Signed)
[facility] Emergency Department Provider Note    First MD Initiated Contact with Patient 03/18/17 0345     (approximate)  I have reviewed the triage vital signs and the nursing notes.   HISTORY  Chief Complaint Chest Pain  HPI Dominique Shea is a 26 y.o. female presents with acute onset of epigastric abdominal pain with nausea vomiting and diarrhea 1 hour before presentation.. Patient denies any leg pain or swelling area patient denies any dyspnea. Patient states she's noted intermittent dyspnea since having her Implanon placed however no dyspnea at this time  Past medical history Recent PID There are no active problems to display for this patient.   Past Surgical History:  Procedure Laterality Date  . CHOLECYSTECTOMY    . FRACTURE SURGERY      Prior to Admission medications   Medication Sig Start Date End Date Taking? Authorizing Provider  butalbital-acetaminophen-caffeine (FIORICET) 50-325-40 MG tablet Take 1-2 tablets by mouth every 6 (six) hours as needed for headache. 12/18/15   Beers, Charmayne Sheerharles M, PA-C  doxycycline (VIBRA-TABS) 100 MG tablet Take 1 tablet (100 mg total) by mouth 2 (two) times daily. 02/21/17   Minna AntisPaduchowski, Kevin, MD  HYDROcodone-acetaminophen (NORCO/VICODIN) 5-325 MG tablet Take 1 tablet by mouth every 4 (four) hours as needed. 02/21/17   Minna AntisPaduchowski, Kevin, MD  naproxen (NAPROSYN) 500 MG tablet Take 1 tablet (500 mg total) by mouth 2 (two) times daily with a meal. 10/13/15   Cuthriell, Delorise RoyalsJonathan D, PA-C  predniSONE (DELTASONE) 20 MG tablet Take 3 tablets (60 mg total) by mouth daily. 02/21/17   Jeanmarie PlantMcShane, James A, MD    Allergies Peanut-containing drug products; Tomato; Benadryl [diphenhydramine]; Doxycycline; Hydrocodone-acetaminophen; Ibuprofen; Ivp dye [iodinated diagnostic agents]; Morphine and related; Rocephin [ceftriaxone sodium in dextrose]; and Zofran [ondansetron hcl]  Family History  Problem Relation Age of Onset  .  Hypertension Mother   . Migraines Mother   . Hypertension Father   . Migraines Father   . Migraines Sister   . Migraines Brother     Social History Social History  Substance Use Topics  . Smoking status: Current Every Day Smoker    Types: Cigarettes  . Smokeless tobacco: Never Used  . Alcohol use Yes     Comment: occ    Review of Systems Constitutional: No fever/chills Eyes: No visual changes. ENT: No sore throat. Cardiovascular: Denies chest pain. Respiratory: Denies shortness of breath. Gastrointestinal: Positive for abdominal pain and vomiting and diarrhea. Genitourinary: Negative for dysuria. Musculoskeletal: Negative for neck pain.  Negative for back pain. Integumentary: Negative for rash. Neurological: Negative for headaches, focal weakness or numbness.   ____________________________________________   PHYSICAL EXAM:  VITAL SIGNS: ED Triage Vitals  Enc Vitals Group     BP 03/18/17 0332 (!) 135/102     Pulse Rate 03/18/17 0332 67     Resp 03/18/17 0332 12     Temp 03/18/17 0332 98.4 F (36.9 C)     Temp Source 03/18/17 0332 Oral     SpO2 03/18/17 0332 100 %     Weight 03/18/17 0326 72.6 kg (160 lb)     Height 03/18/17 0326 1.626 m (5\' 4" )     Head Circumference --      Peak Flow --      Pain Score 03/18/17 0325 9     Pain Loc --      Pain Edu? --      Excl. in GC? --     Constitutional: Alert and  oriented. Well appearing and in no acute distress. Eyes: Conjunctivae are normal.  Head: Atraumatic. Mouth/Throat: Mucous membranes are moist. Oropharynx non-erythematous. Neck: No stridor.   Cardiovascular: Normal rate, regular rhythm. Good peripheral circulation. Grossly normal heart sounds. Respiratory: Normal respiratory effort.  No retractions. Lungs CTAB. Gastrointestinal: Soft and nontender. No distention.  Musculoskeletal: No lower extremity tenderness nor edema. No gross deformities of extremities. Neurologic:  Normal speech and language. No gross  focal neurologic deficits are appreciated.  Skin:  Skin is warm, dry and intact. No rash noted. Psychiatric: Very anxious affect. Speech and behavior are normal.  ____________________________________________   LABS (all labs ordered are listed, but only abnormal results are displayed)  Labs Reviewed  BASIC METABOLIC PANEL - Abnormal; Notable for the following:       Result Value   Potassium 3.4 (*)    CO2 20 (*)    Calcium 8.6 (*)    All other components within normal limits  CBC - Abnormal; Notable for the following:    Platelets 78 (*)    All other components within normal limits  TROPONIN I  FIBRIN DERIVATIVES D-DIMER ([facility] ONLY)   ____________________________________________  EKG  ED ECG REPORT I, [place] Georgenia Salim, the attending physician, personally viewed and interpreted this ECG.   Date: 03/18/2017  EKG Time: 3:33 AM  Rate: 60  Rhythm: Normal sinus rhythm  Axis: Normal  Intervals: Normal  ST&T Change: None  ____________________________________________  RADIOLOGY I, [place] Dewayne Shorter, personally viewed and evaluated these images (plain radiographs) as part of my medical decision making, as well as reviewing the written report by the radiologist.  Dg Chest Port 1 View  Result Date: 03/18/2017 CLINICAL DATA:  Awoke with mid chest pain. EXAM: PORTABLE CHEST 1 VIEW COMPARISON:  09/28/2016 FINDINGS: The cardiomediastinal contours are normal. The lungs are clear. Pulmonary vasculature is normal. No consolidation, pleural effusion, or pneumothorax. No acute osseous abnormalities are seen. IMPRESSION: No acute abnormality. Electronically Signed   By: Rubye Oaks M.D.   On: 03/18/2017 03:55     Procedures   ____________________________________________   INITIAL IMPRESSION / ASSESSMENT AND PLAN / ED COURSE  Pertinent labs & imaging results that were available during my care of the patient were reviewed by me and considered in my medical decision making (see  chart for details).  She given Reglan for nausea patient remained very anxious while in the emergency department. As such patient given 1 mg of Ativan with resolution of all symptoms no further nausea vomiting or diarrhea in the ED.      ____________________________________________  FINAL CLINICAL IMPRESSION(S) / ED DIAGNOSES  Final diagnoses:  Chest pain, unspecified type  Diarrhea of infectious origin     MEDICATIONS GIVEN DURING THIS VISIT:  Medications  sodium chloride 0.9 % bolus 1,000 mL (0 mLs Intravenous Stopped 03/18/17 0540)  metoCLOPramide (REGLAN) injection 10 mg (10 mg Intravenous Given 03/18/17 0401)  LORazepam (ATIVAN) injection 1 mg (1 mg Intravenous Given 03/18/17 0431)     NEW OUTPATIENT MEDICATIONS STARTED DURING THIS VISIT:  New Prescriptions   No medications on file    Modified Medications   No medications on file    Discontinued Medications   No medications on file     Note:  This document was prepared using Dragon voice recognition software and may include unintentional dictation errors.    Darci Current, MD 03/18/17 [phone]

## 2017-03-18 NOTE — ED Triage Notes (Signed)
Patient ambulatory to triage with steady gait, without difficulty or distress noted; pt reports mid CP radiating into back upon awakening hr PTA; st has had intermittent pain since having implanon inserted

## 2017-03-18 NOTE — ED Notes (Signed)
Patient became very restless after receiving reglan. Patient pulled off bp cuff and heart monitor. Dr Manson PasseyBrown in to see patient.

## 2017-04-01 ENCOUNTER — Encounter: Payer: Self-pay | Admitting: Oncology

## 2017-04-01 ENCOUNTER — Inpatient Hospital Stay: Payer: Medicaid Other

## 2017-04-01 ENCOUNTER — Encounter (INDEPENDENT_AMBULATORY_CARE_PROVIDER_SITE_OTHER): Payer: Self-pay

## 2017-04-01 ENCOUNTER — Inpatient Hospital Stay: Payer: Medicaid Other | Attending: Oncology | Admitting: Oncology

## 2017-04-01 VITALS — BP 121/85 | HR 62 | Temp 97.1°F | Resp 18 | Ht 64.0 in | Wt 145.3 lb

## 2017-04-01 DIAGNOSIS — F17210 Nicotine dependence, cigarettes, uncomplicated: Secondary | ICD-10-CM | POA: Insufficient documentation

## 2017-04-01 DIAGNOSIS — R51 Headache: Secondary | ICD-10-CM

## 2017-04-01 DIAGNOSIS — D696 Thrombocytopenia, unspecified: Secondary | ICD-10-CM | POA: Insufficient documentation

## 2017-04-01 DIAGNOSIS — D693 Immune thrombocytopenic purpura: Secondary | ICD-10-CM | POA: Diagnosis not present

## 2017-04-01 DIAGNOSIS — Z79899 Other long term (current) drug therapy: Secondary | ICD-10-CM | POA: Diagnosis not present

## 2017-04-01 LAB — COMPREHENSIVE METABOLIC PANEL
ALBUMIN: 4.2 g/dL (ref 3.5–5.0)
ALT: 10 U/L — ABNORMAL LOW (ref 14–54)
ANION GAP: 6 (ref 5–15)
AST: 22 U/L (ref 15–41)
Alkaline Phosphatase: 36 U/L — ABNORMAL LOW (ref 38–126)
BUN: 15 mg/dL (ref 6–20)
CALCIUM: 8.8 mg/dL — AB (ref 8.9–10.3)
CHLORIDE: 105 mmol/L (ref 101–111)
CO2: 24 mmol/L (ref 22–32)
Creatinine, Ser: 0.91 mg/dL (ref 0.44–1.00)
GFR calc non Af Amer: >60 mL/min (ref >60)
GLUCOSE: 89 mg/dL (ref 65–99)
POTASSIUM: 3.6 mmol/L (ref 3.5–5.1)
SODIUM: 135 mmol/L (ref 135–145)
Total Bilirubin: 1.5 mg/dL — ABNORMAL HIGH (ref 0.3–1.2)
Total Protein: 7.1 g/dL (ref 6.5–8.1)

## 2017-04-01 LAB — CBC WITH DIFFERENTIAL/PLATELET
BASOS PCT: 1 %
Basophils Absolute: 0 10*3/uL (ref 0–0.1)
EOS ABS: 0.1 10*3/uL (ref 0–0.7)
EOS PCT: 1 %
HCT: 41 % (ref 35.0–47.0)
Hemoglobin: 13.8 g/dL (ref 12.0–16.0)
LYMPHS ABS: 2.5 10*3/uL (ref 1.0–3.6)
Lymphocytes Relative: 33 %
MCH: 31.1 pg (ref 26.0–34.0)
MCHC: 33.7 g/dL (ref 32.0–36.0)
MCV: 92.4 fL (ref 80.0–100.0)
MONOS PCT: 8 %
Monocytes Absolute: 0.6 10*3/uL (ref 0.2–0.9)
Neutro Abs: 4.4 10*3/uL (ref 1.4–6.5)
Neutrophils Relative %: 57 %
PLATELETS: 89 10*3/uL — AB (ref 150–440)
RBC: 4.43 MIL/uL (ref 3.80–5.20)
RDW: 13.3 % (ref 11.5–14.5)
WBC: 7.6 10*3/uL (ref 3.6–11.0)

## 2017-04-01 LAB — VITAMIN B12: Vitamin B-12: 300 pg/mL (ref 180–914)

## 2017-04-01 LAB — FOLATE: FOLATE: 8.7 ng/mL (ref >5.9)

## 2017-04-01 LAB — PATHOLOGIST SMEAR REVIEW

## 2017-04-01 NOTE — Progress Notes (Signed)
Patient here today as new evaluation regarding thrombocytopenia.  Referred by Dr. Bufford SpikesWoukl.  Patient states she has mid chest pain that no one has been able to tell why she is having it.  She also states her appetite is about 25% of what it was.  Patient states she passes a lot of clots when having her period.  Her last period started on 03-02-17 and she states she is still bleeding today.  C/o headache today.

## 2017-04-01 NOTE — Progress Notes (Signed)
Hematology/Oncology Consult note [facility] Telephone:([phone] [phone]  Patient Care Team: Kathrynn Running, MD as PCP - General (Obstetrics and Gynecology)   Name of the patient: Dominique Shea  [id number]  03/15/1991    Reason for referral- Thrombocytopenia   Referring physician- Dr. Ashok Pall  Date of visit: 04/01/17   History of presenting illness- patient is a 26 year old African-American female who was been referred to [place] for thrombocytopenia. Patient has been known to have thrombocytopenia at least dating back to 2014 with a platelet counts were between 110s to 120. Most recent CBC is from 02/21/2017 showed white count of 12.3, H&H of 14/40.6 and a platelet count of 82 and on 03/18/2017 a platelet count was 78. Patient currently has an implantable birth control in she reports irregular periods and also reports on and off headaches. She has been smoking 2L of VAD her headaches. Denies any over-the-counter medications or herbal supplements. Denies any family history of bleeding disorder. Denies any bruising or easy bleeding. She has had prior surgeries including gallbladder surgery without any bleeding complications.   ECOG PS- 0  Pain scale- 8 headaches   Review of systems- Review of Systems  Constitutional: Negative for chills, fever, malaise/fatigue and weight loss.  HENT: Negative for congestion, ear discharge and nosebleeds.   Eyes: Negative for blurred vision.  Respiratory: Negative for cough, hemoptysis, sputum production, shortness of breath and wheezing.   Cardiovascular: Negative for chest pain, palpitations, orthopnea and claudication.  Gastrointestinal: Negative for abdominal pain, blood in stool, constipation, diarrhea, heartburn, melena, nausea and vomiting.  Genitourinary: Negative for dysuria, flank pain, frequency, hematuria and urgency.  Musculoskeletal: Negative for back pain, joint pain and myalgias.  Skin: Negative for  rash.  Neurological: Positive for headaches. Negative for dizziness, tingling, focal weakness, seizures and weakness.  Endo/Heme/Allergies: Does not bruise/bleed easily.  Psychiatric/Behavioral: Negative for depression and suicidal ideas. The patient does not have insomnia.     Allergies  Allergen Reactions  . Peanut-Containing Drug Products Anaphylaxis  . Tomato Anaphylaxis  . Benadryl [Diphenhydramine]     Pt reports throat swelling  . Doxycycline     Pt had facial swelling after receiving ms04, zofran, hydrocodone, tylenol, ivp dye, and doxycline. Unclear which was responsible agent.   . Hydrocodone-Acetaminophen     Pt had facial swelling after receiving ms04, zofran, hydrocodone, tylenol, ivp dye, and doxycline. Unclear which was responsible agent.   . Ibuprofen Swelling  . Ivp Dye [Iodinated Diagnostic Agents]     Pt had facial swelling after receiving ms04, zofran, hydrocodone, tylenol, ivp dye, and doxycline. Unclear which was responsible agent.   . Morphine And Related     Pt had facial swelling after receiving ms04, zofran, hydrocodone, tylenol, ivp dye, and doxycline. Unclear which was responsible agent.   . Rocephin [Ceftriaxone Sodium In Dextrose]     Pt had facial swelling after receiving ms04, zofran, hydrocodone, tylenol, ivp dye, and doxycline. Unclear which was responsible agent.   Deirdre Peer Hcl]     Pt had facial swelling after receiving ms04, zofran, hydrocodone, tylenol, ivp dye, and doxycline. Unclear which was responsible agent.     There are no active problems to display for this patient.    Past Medical History:  Diagnosis Date  . Clotting disorder [facility])      Past Surgical History:  Procedure Laterality Date  . CHOLECYSTECTOMY    . FRACTURE SURGERY      Social History   Social History  .  Marital status: Single    Spouse name: N/A  . Number of children: N/A  . Years of education: N/A   Occupational History  . Not on file.    Social History Main Topics  . Smoking status: Current Every Day Smoker    Packs/day: 1.00    Years: 4.00    Types: Cigarettes  . Smokeless tobacco: Never Used  . Alcohol use Yes     Comment: occ  . Drug use: Yes    Types: Marijuana  . Sexual activity: Yes    Birth control/ protection: Implant   Other Topics Concern  . Not on file   Social History Narrative  . No narrative on file     Family History  Problem Relation Age of Onset  . Hypertension Mother   . Migraines Mother   . Hypertension Father   . Migraines Father   . Migraines Sister   . Migraines Brother      Current Outpatient Prescriptions:  .  butalbital-acetaminophen-caffeine (FIORICET) 50-325-40 MG tablet, Take 1-2 tablets by mouth every 6 (six) hours as needed for headache. (Patient not taking: Reported on 04/01/2017), Disp: 30 tablet, Rfl: 0 .  doxycycline (VIBRA-TABS) 100 MG tablet, Take 1 tablet (100 mg total) by mouth 2 (two) times daily. (Patient not taking: Reported on 04/01/2017), Disp: 20 tablet, Rfl: 0 .  HYDROcodone-acetaminophen (NORCO/VICODIN) 5-325 MG tablet, Take 1 tablet by mouth every 4 (four) hours as needed. (Patient not taking: Reported on 04/01/2017), Disp: 10 tablet, Rfl: 0 .  naproxen (NAPROSYN) 500 MG tablet, Take 1 tablet (500 mg total) by mouth 2 (two) times daily with a meal. (Patient not taking: Reported on 04/01/2017), Disp: 60 tablet, Rfl: 0 .  predniSONE (DELTASONE) 20 MG tablet, Take 3 tablets (60 mg total) by mouth daily. (Patient not taking: Reported on 04/01/2017), Disp: 15 tablet, Rfl: 0   Physical exam:  Vitals:   04/01/17 1457  BP: 121/85  Pulse: 62  Resp: 18  Temp: (!) 97.1 F (36.2 C)  Weight: 145 lb 5 oz (65.9 kg)  Height: 5\' 4"  (1.626 m)   Physical Exam  Constitutional: She is oriented to person, place, and time and well-developed, well-nourished, and in no distress.  HENT:  Head: Normocephalic and atraumatic.  Eyes: EOM are normal. Pupils are equal, round, and  reactive to light.  Neck: Normal range of motion.  Cardiovascular: Normal rate, regular rhythm and normal heart sounds.   Pulmonary/Chest: Effort normal and breath sounds normal.  Abdominal: Soft. Bowel sounds are normal.  No palpable splenomegaly  Neurological: She is alert and oriented to person, place, and time.  Skin: Skin is warm and dry.  Scattered tattoos. No bruises       CMP Latest Ref Rng & Units 03/18/2017  Glucose 65 - 99 mg/dL 97  BUN 6 - 20 mg/dL 15  Creatinine 0.98 - 1.19 mg/dL 1.47  Sodium 829 - 562 mmol/L 135  Potassium 3.5 - 5.1 mmol/L 3.4(L)  Chloride 101 - 111 mmol/L 109  CO2 22 - 32 mmol/L 20(L)  Calcium 8.9 - 10.3 mg/dL 1.3(Y)  Total Protein 6.5 - 8.1 g/dL -  Total Bilirubin 0.3 - 1.2 mg/dL -  Alkaline Phos 38 - 865 U/L -  AST 15 - 41 U/L -  ALT 14 - 54 U/L -   CBC Latest Ref Rng & Units 03/18/2017  WBC 3.6 - 11.0 K/uL 7.5  Hemoglobin 12.0 - 16.0 g/dL 78.4  Hematocrit 69.6 - 47.0 % 41.7  Platelets 150 - 440 K/uL 78(L)    No images are attached to the encounter.  Dg Chest Port 1 View  Result Date: 03/18/2017 CLINICAL DATA:  Awoke with mid chest pain. EXAM: PORTABLE CHEST 1 VIEW COMPARISON:  09/28/2016 FINDINGS: The cardiomediastinal contours are normal. The lungs are clear. Pulmonary vasculature is normal. No consolidation, pleural effusion, or pneumothorax. No acute osseous abnormalities are seen. IMPRESSION: No acute abnormality. Electronically Signed   By: Rubye OaksMelanie  Ehinger M.D.   On: 03/18/2017 03:55    Assessment and plan- Patient is a 26 y.o. female Referred for thrombocytopenia likely due to ITP   patient has isolated thrombocytopenia and a platelet counts have been between 70s to 1/10 over the last 4 years. Could be secondary to ITP. She is currently not on any medications that can cause thrombocytopenia. Today I will check a CBC with differential, pathologically review of her smear, B12, folate, HIV, hepatitis C antibody and stool H. pylori  antigen testing. I will see the patient back in 2 weeks' time to discuss the results of her workup. As long as her platelet counts are greater than 30 and she has isolated thrombocytopenia, this can be monitored conservatively without any treatment  Headaches- unrelated to thrombocytopenia. F/u with DR. Wouk  Thank you for this kind referral and the opportunity to participate in the care of this patient   Visit Diagnosis 1. Thrombocytopenia (HCC)     Dr. Owens SharkArchana Mickie Kozikowski, MD, MPH [facility] Pager- [phone] 04/01/2017  4:27 PM

## 2017-04-02 LAB — HIV ANTIBODY (ROUTINE TESTING W REFLEX): HIV Screen 4th Generation wRfx: NONREACTIVE

## 2017-04-02 LAB — HEPATITIS C ANTIBODY: HCV Ab: <0.1 s/co ratio (ref 0.0–0.9)

## 2017-04-15 ENCOUNTER — Inpatient Hospital Stay (HOSPITAL_BASED_OUTPATIENT_CLINIC_OR_DEPARTMENT_OTHER): Payer: Medicaid Other | Admitting: Oncology

## 2017-04-15 ENCOUNTER — Encounter (INDEPENDENT_AMBULATORY_CARE_PROVIDER_SITE_OTHER): Payer: Self-pay

## 2017-04-15 ENCOUNTER — Encounter: Payer: Self-pay | Admitting: Oncology

## 2017-04-15 VITALS — HR 84 | Temp 97.7°F | Resp 20 | Wt 140.4 lb

## 2017-04-15 DIAGNOSIS — D693 Immune thrombocytopenic purpura: Secondary | ICD-10-CM

## 2017-04-15 DIAGNOSIS — F17210 Nicotine dependence, cigarettes, uncomplicated: Secondary | ICD-10-CM

## 2017-04-15 DIAGNOSIS — Z79899 Other long term (current) drug therapy: Secondary | ICD-10-CM

## 2017-04-15 DIAGNOSIS — D696 Thrombocytopenia, unspecified: Secondary | ICD-10-CM

## 2017-04-15 NOTE — Progress Notes (Signed)
Patient reports she is here for results today.

## 2017-04-15 NOTE — Progress Notes (Signed)
Hematology/Oncology Consult note [facility]  [phone] [phone]  Patient Care Team: Gwynne Edinger, MD as PCP - General (Obstetrics and Gynecology)   Name of the patient: Dominique Shea  [id number]  05/20/1991   Date of visit: 04/15/17  Diagnosis- thrombocytopenia likely due to ITP  Chief complaint/ Reason for visit- discuss results of bloodwork   Heme/Onc history: patient is a 26 year old African-American female who was been referred to [place] for thrombocytopenia. Patient has been known to have thrombocytopenia at least dating back to 2014 with a platelet counts were between 110s to 120. Most recent CBC is from 02/21/2017 showed white count of 12.3, H&H of 14/40.6 and a platelet count of 82 and on 03/18/2017 a platelet count was 78. Patient currently has an implantable birth control in she reports irregular periods and also reports on and off headaches. She has been smoking 2L of VAD her headaches. Denies any over-the-counter medications or herbal supplements. Denies any family history of bleeding disorder. Denies any bruising or easy bleeding. She has had prior surgeries including gallbladder surgery without any bleeding complications.   Results of blood work from 04/01/2017 were as follows: CBC showed white count of 7.6, H&H of 13.8/41, platelet count of 89. CMP was within normal limits except for mildly elevated bilirubin of 1.5. B12 and folate were within normal limits. HIV testing was negative. Hep C antibody was negative. Peripheral blood smear review showed thrombocytopenia without clumping. RBCs are unremarkable. WBC and differential within normal limits. No abnormal or immature mucositis is seen. Patient has not yet given stool sample for H pylori testing.  Interval history- still has on and off headaches. No bleeding or bruising  ECOG PS- 0 Pain scale- 0  Review of systems- Review of Systems  Constitutional: Negative for  chills, fever, malaise/fatigue and weight loss.  HENT: Negative for congestion, ear discharge and nosebleeds.   Eyes: Negative for blurred vision.  Respiratory: Negative for cough, hemoptysis, sputum production, shortness of breath and wheezing.   Cardiovascular: Negative for chest pain, palpitations, orthopnea and claudication.  Gastrointestinal: Negative for abdominal pain, blood in stool, constipation, diarrhea, heartburn, melena, nausea and vomiting.  Genitourinary: Negative for dysuria, flank pain, frequency, hematuria and urgency.  Musculoskeletal: Negative for back pain, joint pain and myalgias.  Skin: Negative for rash.  Neurological: Positive for headaches. Negative for dizziness, tingling, focal weakness, seizures and weakness.  Endo/Heme/Allergies: Does not bruise/bleed easily.  Psychiatric/Behavioral: Negative for depression and suicidal ideas. The patient does not have insomnia.      Allergies  Allergen Reactions  . Peanut-Containing Drug Products Anaphylaxis  . Tomato Anaphylaxis  . Benadryl [Diphenhydramine]     Pt reports throat swelling  . Doxycycline     Pt had facial swelling after receiving ms04, zofran, hydrocodone, tylenol, ivp dye, and doxycline. Unclear which was responsible agent.   . Hydrocodone-Acetaminophen     Pt had facial swelling after receiving ms04, zofran, hydrocodone, tylenol, ivp dye, and doxycline. Unclear which was responsible agent.   . Ibuprofen Swelling  . Ivp Dye [Iodinated Diagnostic Agents]     Pt had facial swelling after receiving ms04, zofran, hydrocodone, tylenol, ivp dye, and doxycline. Unclear which was responsible agent.   . Morphine And Related     Pt had facial swelling after receiving ms04, zofran, hydrocodone, tylenol, ivp dye, and doxycline. Unclear which was responsible agent.   . Rocephin [Ceftriaxone Sodium In Dextrose]     Pt had facial swelling after receiving  ms04, zofran, hydrocodone, tylenol, ivp dye, and doxycline.  Unclear which was responsible agent.   Tresa Moore Hcl]     Pt had facial swelling after receiving ms04, zofran, hydrocodone, tylenol, ivp dye, and doxycline. Unclear which was responsible agent.      Past Medical History:  Diagnosis Date  . Clotting disorder [facility])      Past Surgical History:  Procedure Laterality Date  . CHOLECYSTECTOMY    . FRACTURE SURGERY      Social History   Social History  . Marital status: Single    Spouse name: N/A  . Number of children: N/A  . Years of education: N/A   Occupational History  . Not on file.   Social History Main Topics  . Smoking status: Current Every Day Smoker    Packs/day: 1.00    Years: 4.00    Types: Cigarettes  . Smokeless tobacco: Never Used  . Alcohol use Yes     Comment: occ  . Drug use: Yes    Types: Marijuana  . Sexual activity: Yes    Birth control/ protection: Implant   Other Topics Concern  . Not on file   Social History Narrative  . No narrative on file    Family History  Problem Relation Age of Onset  . Hypertension Mother   . Migraines Mother   . Hypertension Father   . Migraines Father   . Migraines Sister   . Migraines Brother     No current outpatient prescriptions on file.  Physical exam:  Vitals:   04/15/17 1050  BP: 126/84  Pulse: 84  Resp: 20  Temp: 97.7 F (36.5 C)  TempSrc: Tympanic  Weight: 140 lb 6.4 oz (63.7 kg)   Physical Exam  Constitutional: She is oriented to person, place, and time and well-developed, well-nourished, and in no distress.  HENT:  Head: Normocephalic and atraumatic.  Eyes: Pupils are equal, round, and reactive to light. EOM are normal.  Neck: Normal range of motion.  Cardiovascular: Normal rate, regular rhythm and normal heart sounds.   Pulmonary/Chest: Effort normal and breath sounds normal.  Abdominal: Soft. Bowel sounds are normal.  Neurological: She is alert and oriented to person, place, and time.  Skin: Skin is warm and dry.      CMP Latest Ref Rng & Units 04/01/2017  Glucose 65 - 99 mg/dL 89  BUN 6 - 20 mg/dL 15  Creatinine 0.44 - 1.00 mg/dL 0.91  Sodium 135 - 145 mmol/L 135  Potassium 3.5 - 5.1 mmol/L 3.6  Chloride 101 - 111 mmol/L 105  CO2 22 - 32 mmol/L 24  Calcium 8.9 - 10.3 mg/dL 8.8(L)  Total Protein 6.5 - 8.1 g/dL 7.1  Total Bilirubin 0.3 - 1.2 mg/dL 1.5(H)  Alkaline Phos 38 - 126 U/L 36(L)  AST 15 - 41 U/L 22  ALT 14 - 54 U/L 10(L)   CBC Latest Ref Rng & Units 04/01/2017  WBC 3.6 - 11.0 K/uL 7.6  Hemoglobin 12.0 - 16.0 g/dL 13.8  Hematocrit 35.0 - 47.0 % 41.0  Platelets 150 - 440 K/uL 89(L)    No images are attached to the encounter.  Dg Chest Port 1 View  Result Date: 03/18/2017 CLINICAL DATA:  Awoke with mid chest pain. EXAM: PORTABLE CHEST 1 VIEW COMPARISON:  09/28/2016 FINDINGS: The cardiomediastinal contours are normal. The lungs are clear. Pulmonary vasculature is normal. No consolidation, pleural effusion, or pneumothorax. No acute osseous abnormalities are seen. IMPRESSION: No acute abnormality. Electronically Signed  By: Jeb Levering M.D.   On: 03/18/2017 03:55     Assessment and plan- Patient is a 26 y.o. female referred for thrombocytopenia likely secondary to ITP  Patient has isolated thrombocytopenia over the last 4 years at least. Her platelet count waxes and wanes between 70s 200s. Her white count and H&H are within normal limits. I discussed the results of the blood work with the patient as above which showed B12 and folate were within normal limits and HIV and hepatitis C testing was negative. She will give [place] an H pylori stool sample when feasible. I will see her back in 6 months with a repeat CBC with differential. She does not need a bone marrow biopsy at this time. She will need treatment for ITP if her platelet counts are less than 30. As long as the platelet counts are greater than 50 she would be okay to proceed with elective surgery in the future if any as well.   Visit  Diagnosis 1. Chronic ITP (idiopathic thrombocytopenia) (HCC)      Dr. Randa Evens, MD, MPH [facility] Pager- [phone] 04/15/2017 11:13 AM

## 2017-04-18 ENCOUNTER — Other Ambulatory Visit: Payer: Self-pay | Admitting: *Deleted

## 2017-04-18 DIAGNOSIS — D696 Thrombocytopenia, unspecified: Secondary | ICD-10-CM

## 2017-04-21 LAB — H. PYLORI ANTIGEN, STOOL: H. Pylori Stool Ag, Eia: NEGATIVE

## 2017-05-24 ENCOUNTER — Encounter: Payer: Self-pay | Admitting: Advanced Practice Midwife

## 2017-05-27 ENCOUNTER — Encounter: Payer: Self-pay | Admitting: Advanced Practice Midwife

## 2017-05-27 ENCOUNTER — Ambulatory Visit (INDEPENDENT_AMBULATORY_CARE_PROVIDER_SITE_OTHER): Payer: Medicaid Other | Admitting: Advanced Practice Midwife

## 2017-05-27 VITALS — BP 122/78 | HR 73 | Ht 64.0 in | Wt 144.0 lb

## 2017-05-27 DIAGNOSIS — Z113 Encounter for screening for infections with a predominantly sexual mode of transmission: Secondary | ICD-10-CM | POA: Diagnosis not present

## 2017-05-27 DIAGNOSIS — Z30011 Encounter for initial prescription of contraceptive pills: Secondary | ICD-10-CM

## 2017-05-27 DIAGNOSIS — A599 Trichomoniasis, unspecified: Secondary | ICD-10-CM

## 2017-05-27 DIAGNOSIS — Z0000 Encounter for general adult medical examination without abnormal findings: Secondary | ICD-10-CM | POA: Diagnosis not present

## 2017-05-27 DIAGNOSIS — A549 Gonococcal infection, unspecified: Secondary | ICD-10-CM

## 2017-05-27 DIAGNOSIS — Z124 Encounter for screening for malignant neoplasm of cervix: Secondary | ICD-10-CM

## 2017-05-27 DIAGNOSIS — Z01419 Encounter for gynecological examination (general) (routine) without abnormal findings: Secondary | ICD-10-CM | POA: Diagnosis not present

## 2017-05-27 MED ORDER — NORETHIN ACE-ETH ESTRAD-FE 1-20 MG-MCG PO TABS: 1.0000 | ORAL_TABLET | Freq: Every day | ORAL | 11 refills | Status: DC

## 2017-05-27 MED ORDER — NORETHINDRONE 0.35 MG PO TABS: 1.0000 | ORAL_TABLET | Freq: Every day | ORAL | 11 refills | Status: DC
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
## 2017-05-31 LAB — IGP,CTNGTV,RFX APTIMA HPV ASCU
Chlamydia, Nuc. Acid Amp: NEGATIVE
GONOCOCCUS, NUC. ACID AMP: POSITIVE — AB
PAP SMEAR COMMENT: 0
Trich vag by NAA: POSITIVE — AB

## 2017-06-06 ENCOUNTER — Encounter: Payer: Self-pay | Admitting: Advanced Practice Midwife

## 2017-06-06 MED ORDER — METRONIDAZOLE 500 MG PO TABS: 500.0000 mg | ORAL_TABLET | Freq: Two times a day (BID) | ORAL | 0 refills | Status: AC

## 2017-06-06 MED ORDER — AZITHROMYCIN 500 MG PO TABS: 1000.0000 mg | ORAL_TABLET | Freq: Once | ORAL | 0 refills | Status: AC

## 2017-06-06 MED ORDER — CEFTRIAXONE SODIUM 250 MG IJ SOLR
250.0000 mg | Freq: Once | INTRAMUSCULAR | Status: AC
  Administered 2017-06-07: 250 mg via INTRAMUSCULAR

## 2017-06-06 NOTE — Progress Notes (Signed)
Results of recent lab work Results for Carla DrapeDAVIS, Dionne Y (MRN [id number]) as of 06/06/2017 14:12  Ref. Range 05/27/2017 11:56  Trich vag by NAA Latest Ref Range: Negative  Positive (A)  Chlamydia, Nuc. Acid Amp Latest Ref Range: Negative  Negative  Gonococcus, Nuc. Acid Amp Latest Ref Range: Negative  Positive (A)   Patient notified, medications prescribed and instructions given. Test of Cure in early October  Tresea MallJane Stevie Charter, CNM

## 2017-06-06 NOTE — Addendum Note (Signed)
Addended by: Tresea MallGLEDHILL, Nyshaun Standage on: 06/06/2017 02:01 PM   Modules accepted: Orders

## 2017-06-07 ENCOUNTER — Ambulatory Visit (INDEPENDENT_AMBULATORY_CARE_PROVIDER_SITE_OTHER): Payer: Medicaid Other

## 2017-06-07 DIAGNOSIS — Z202 Contact with and (suspected) exposure to infections with a predominantly sexual mode of transmission: Secondary | ICD-10-CM | POA: Diagnosis not present

## 2017-06-07 DIAGNOSIS — A549 Gonococcal infection, unspecified: Secondary | ICD-10-CM

## 2017-08-10 ENCOUNTER — Other Ambulatory Visit: Payer: Self-pay

## 2017-08-10 ENCOUNTER — Emergency Department
Admission: EM | Admit: 2017-08-10 | Discharge: 2017-08-10 | Disposition: A | Discharge status: Home / Self Care | Payer: Medicaid Other | Attending: Emergency Medicine | Admitting: Emergency Medicine

## 2017-08-10 DIAGNOSIS — Z5321 Procedure and treatment not carried out due to patient leaving prior to being seen by health care provider: Secondary | ICD-10-CM | POA: Insufficient documentation

## 2017-08-10 DIAGNOSIS — R109 Unspecified abdominal pain: Secondary | ICD-10-CM | POA: Diagnosis present

## 2017-08-10 DIAGNOSIS — R112 Nausea with vomiting, unspecified: Secondary | ICD-10-CM | POA: Diagnosis not present

## 2017-08-10 LAB — URINALYSIS, COMPLETE (UACMP) WITH MICROSCOPIC
BACTERIA UA: NONE SEEN
Bilirubin Urine: NEGATIVE
Glucose, UA: NEGATIVE mg/dL
HGB URINE DIPSTICK: NEGATIVE
KETONES UR: 5 mg/dL — AB
Leukocytes, UA: NEGATIVE
NITRITE: NEGATIVE
PROTEIN: 30 mg/dL — AB
RBC / HPF: NONE SEEN RBC/hpf (ref 0–5)
Specific Gravity, Urine: 1.029 (ref 1.005–1.030)
pH: 6 (ref 5.0–8.0)

## 2017-08-10 LAB — CBC
HEMATOCRIT: 42.3 % (ref 35.0–47.0)
HEMOGLOBIN: 13.9 g/dL (ref 12.0–16.0)
MCH: 30.6 pg (ref 26.0–34.0)
MCHC: 33 g/dL (ref 32.0–36.0)
MCV: 92.9 fL (ref 80.0–100.0)
Platelets: 79 10*3/uL — ABNORMAL LOW (ref 150–440)
RBC: 4.55 MIL/uL (ref 3.80–5.20)
RDW: 12.7 % (ref 11.5–14.5)
WBC: 6.4 10*3/uL (ref 3.6–11.0)

## 2017-08-10 LAB — COMPREHENSIVE METABOLIC PANEL
ALBUMIN: 4.4 g/dL (ref 3.5–5.0)
ALT: 12 U/L — ABNORMAL LOW (ref 14–54)
AST: 24 U/L (ref 15–41)
Alkaline Phosphatase: 39 U/L (ref 38–126)
Anion gap: 10 (ref 5–15)
BUN: 17 mg/dL (ref 6–20)
CO2: 21 mmol/L — AB (ref 22–32)
Calcium: 9.4 mg/dL (ref 8.9–10.3)
Chloride: 104 mmol/L (ref 101–111)
Creatinine, Ser: 0.87 mg/dL (ref 0.44–1.00)
GFR calc Af Amer: >60 mL/min (ref >60)
GFR calc non Af Amer: >60 mL/min (ref >60)
GLUCOSE: 95 mg/dL (ref 65–99)
POTASSIUM: 4 mmol/L (ref 3.5–5.1)
SODIUM: 135 mmol/L (ref 135–145)
Total Bilirubin: 2.2 mg/dL — ABNORMAL HIGH (ref 0.3–1.2)
Total Protein: 7.7 g/dL (ref 6.5–8.1)

## 2017-08-10 LAB — LIPASE, BLOOD: Lipase: 25 U/L (ref 11–51)

## 2017-08-10 LAB — POCT PREGNANCY, URINE: PREG TEST UR: POSITIVE — AB

## 2017-08-10 NOTE — ED Triage Notes (Signed)
Pt c/o abd pain that radiates into the left flank for the past 3 days with N/V..Marland Kitchen

## 2017-08-10 NOTE — ED Notes (Signed)
Pt left after traige

## 2017-08-14 ENCOUNTER — Other Ambulatory Visit: Payer: Self-pay

## 2017-08-14 ENCOUNTER — Emergency Department (HOSPITAL_COMMUNITY)
Admission: EM | Admit: 2017-08-14 | Discharge: 2017-08-14 | Disposition: A | Discharge status: Home / Self Care | Payer: Medicaid Other | Attending: Emergency Medicine | Admitting: Emergency Medicine

## 2017-08-14 ENCOUNTER — Encounter (HOSPITAL_COMMUNITY): Payer: Self-pay | Admitting: Emergency Medicine

## 2017-08-14 DIAGNOSIS — O99330 Smoking (tobacco) complicating pregnancy, unspecified trimester: Secondary | ICD-10-CM | POA: Insufficient documentation

## 2017-08-14 DIAGNOSIS — Z3A00 Weeks of gestation of pregnancy not specified: Secondary | ICD-10-CM | POA: Diagnosis not present

## 2017-08-14 DIAGNOSIS — Z91010 Allergy to peanuts: Secondary | ICD-10-CM | POA: Diagnosis not present

## 2017-08-14 DIAGNOSIS — R110 Nausea: Secondary | ICD-10-CM

## 2017-08-14 DIAGNOSIS — O219 Vomiting of pregnancy, unspecified: Secondary | ICD-10-CM | POA: Insufficient documentation

## 2017-08-14 DIAGNOSIS — Z3490 Encounter for supervision of normal pregnancy, unspecified, unspecified trimester: Secondary | ICD-10-CM

## 2017-08-14 DIAGNOSIS — F17210 Nicotine dependence, cigarettes, uncomplicated: Secondary | ICD-10-CM | POA: Insufficient documentation

## 2017-08-14 LAB — CBC WITH DIFFERENTIAL/PLATELET
BASOS ABS: 0 10*3/uL (ref 0.0–0.1)
BASOS PCT: 0 %
EOS ABS: 0.1 10*3/uL (ref 0.0–0.7)
EOS PCT: 1 %
HCT: 40.7 % (ref 36.0–46.0)
HEMOGLOBIN: 14.1 g/dL (ref 12.0–15.0)
Lymphocytes Relative: 21 %
Lymphs Abs: 1.8 10*3/uL (ref 0.7–4.0)
MCH: 30.9 pg (ref 26.0–34.0)
MCHC: 34.6 g/dL (ref 30.0–36.0)
MCV: 89.3 fL (ref 78.0–100.0)
Monocytes Absolute: 0.7 10*3/uL (ref 0.1–1.0)
Monocytes Relative: 8 %
NEUTROS PCT: 70 %
Neutro Abs: 6 10*3/uL (ref 1.7–7.7)
PLATELETS: 88 10*3/uL — AB (ref 150–400)
RBC: 4.56 MIL/uL (ref 3.87–5.11)
RDW: 12.2 % (ref 11.5–15.5)
WBC: 8.7 10*3/uL (ref 4.0–10.5)

## 2017-08-14 LAB — URINALYSIS, ROUTINE W REFLEX MICROSCOPIC
BILIRUBIN URINE: NEGATIVE
GLUCOSE, UA: NEGATIVE mg/dL
HGB URINE DIPSTICK: NEGATIVE
Ketones, ur: 5 mg/dL — AB
LEUKOCYTES UA: NEGATIVE
NITRITE: NEGATIVE
Protein, ur: 30 mg/dL — AB
SPECIFIC GRAVITY, URINE: 1.026 (ref 1.005–1.030)
pH: 6 (ref 5.0–8.0)

## 2017-08-14 LAB — COMPREHENSIVE METABOLIC PANEL
ALT: 12 U/L — AB (ref 14–54)
AST: 22 U/L (ref 15–41)
Albumin: 4.6 g/dL (ref 3.5–5.0)
Alkaline Phosphatase: 38 U/L (ref 38–126)
Anion gap: 9 (ref 5–15)
BILIRUBIN TOTAL: 2.1 mg/dL — AB (ref 0.3–1.2)
BUN: 14 mg/dL (ref 6–20)
CALCIUM: 9.9 mg/dL (ref 8.9–10.3)
CHLORIDE: 106 mmol/L (ref 101–111)
CO2: 21 mmol/L — ABNORMAL LOW (ref 22–32)
CREATININE: 0.82 mg/dL (ref 0.44–1.00)
Glucose, Bld: 100 mg/dL — ABNORMAL HIGH (ref 65–99)
Potassium: 3.5 mmol/L (ref 3.5–5.1)
Sodium: 136 mmol/L (ref 135–145)
TOTAL PROTEIN: 7.7 g/dL (ref 6.5–8.1)

## 2017-08-14 LAB — HCG, QUANTITATIVE, PREGNANCY: HCG, BETA CHAIN, QUANT, S: 34909 m[IU]/mL — AB (ref <5)

## 2017-08-14 LAB — POC URINE PREG, ED: PREG TEST UR: POSITIVE — AB

## 2017-08-14 LAB — LIPASE, BLOOD: LIPASE: 23 U/L (ref 11–51)

## 2017-08-14 MED ORDER — ONDANSETRON HCL 4 MG/2ML IJ SOLN
4.0000 mg | Freq: Once | INTRAMUSCULAR | Status: AC
  Administered 2017-08-14: 4 mg via INTRAVENOUS
  Filled 2017-08-14: qty 2

## 2017-08-14 MED ORDER — SODIUM CHLORIDE 0.9 % IV BOLUS (SEPSIS)
500.0000 mL | Freq: Once | INTRAVENOUS | Status: AC
  Administered 2017-08-14: 500 mL via INTRAVENOUS

## 2017-08-14 MED ORDER — ONDANSETRON HCL 4 MG PO TABS: 4.0000 mg | ORAL_TABLET | Freq: Four times a day (QID) | ORAL | 0 refills | Status: DC

## 2017-08-14 MED ORDER — SODIUM CHLORIDE 0.9 % IV BOLUS (SEPSIS)
1000.0000 mL | Freq: Once | INTRAVENOUS | Status: AC
  Administered 2017-08-14: 1000 mL via INTRAVENOUS

## 2017-08-14 NOTE — Discharge Instructions (Addendum)
Take zofran as directed for nausea.   You  can take Tylenol for pain.  As we discussed, you will need to follow-up with your OB/GYN as you previously scheduled.  Return the emergency department for any fever, worsening pain, blood in urine, pain with urination, vaginal bleeding, vaginal discharge or any other worsening or concerning symptoms.

## 2017-08-14 NOTE — ED Notes (Signed)
Pt tolerating po fluids well  

## 2017-08-14 NOTE — ED Triage Notes (Signed)
Back left side pain, hesitancy with urination, bodyaches and vomiting for 3-4 days.

## 2017-08-14 NOTE — ED Provider Notes (Signed)
[facility] EMERGENCY DEPARTMENT Provider Note   CSN: [id number] Arrival date & time: 08/14/17  4098     History   Chief Complaint Chief Complaint  Patient presents with  . Flank Pain    HPI Dominique Shea is a 26 y.o. female who presents with 4 days of left flank pain that radiates to the left lateral side.  Patient reports that symptoms have progressively worsened over the last 4 days.  She reports associated nausea/vomiting, generalized myalgias.  Patient reports subjective fever and chills but has not measured a temperature.  Patient reports she has had multiple episodes of nonbloody, nonbilious vomiting since onset of symptoms.  She is able to tolerate liquids but has not been able to eat much secondary to symptoms.  Patient initially went to [facility] for evaluation of symptoms 4 days ago but left prior to evaluation.  Patient has not been taking any medications for the pain.  Patient denies any chest pain, difficulty breathing, vaginal discharge, vaginal bleeding, dysuria, hematuria.  Patient reports that she currently is sexually active with one partner.  They do not use any protection.  The history is provided by the patient.    Past Medical History:  Diagnosis Date  . 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 Clotting disorder (HCC)    Thrombocytopenia during first pregnancy  . History of gonorrhea 2014   TX C ROCEPHIN ON 6/25/1 IN OFFICE  . Thrombocytopenia (HCC)    DURING FIRST PREG    Patient Active Problem List   Diagnosis Date Noted  . Supervision of other normal pregnancy, antepartum 08/16/2017  . Thrombocytopenia (HCC) 07/12/2011    Past Surgical History:  Procedure Laterality Date  . CHOLECYSTECTOMY  2012   [facility]  . FRACTURE SURGERY      OB History    Gravida Para Term Preterm AB Living   4 2 2   1 2    SAB TAB Ectopic Multiple Live Births   1       1       Home Medications    Prior to Admission medications     Medication Sig Start Date End Date Taking? Authorizing Provider  ondansetron (ZOFRAN) 4 MG tablet Take 1 tablet (4 mg total) every 6 (six) hours by mouth. 08/14/17   Maxwell Caul, PA-C    Family History Family History  Problem Relation Age of Onset  . Hypertension Mother   . Migraines Mother   . Hypertension Father   . Migraines Father   . Migraines Sister   . Migraines Brother     Social History Social History   Tobacco Use  . Smoking status: Current Every Day Smoker    Packs/day: 1.00    Years: 4.00    Pack years: 4.00    Types: Cigarettes  . Smokeless tobacco: Never Used  Substance Use Topics  . Alcohol use: Yes    Comment: occ  . Drug use: Yes    Types: Marijuana     Allergies   Peanut-containing drug products; Tomato; Benadryl [diphenhydramine]; Doxycycline; Hydrocodone-acetaminophen; Ibuprofen; Ivp dye [iodinated diagnostic agents]; Morphine and related; and Rocephin [ceftriaxone sodium in dextrose]   Review of Systems Review of Systems  Constitutional: Positive for chills and fever (subjective).  HENT: Negative for congestion.   Respiratory: Negative for shortness of breath.   Cardiovascular: Negative for chest pain.  Gastrointestinal: Positive for abdominal pain. Negative for nausea and vomiting.  Genitourinary: Positive for  flank pain. Negative for dysuria and hematuria.  Musculoskeletal: Negative for back pain and neck pain.  Skin: Negative for rash.  Neurological: Negative for numbness and headaches.     Physical Exam Updated Vital Signs BP 119/82 (BP Location: Left Arm)   Pulse 61   Temp 98 F (36.7 C) (Oral)   Resp 18   LMP 06/29/2017   SpO2 100%   Physical Exam  Constitutional: She is oriented to person, place, and time. She appears well-developed and well-nourished.  Appears uncomfortable but no acute distress   HENT:  Head: Normocephalic and atraumatic.  Mouth/Throat: Oropharynx is clear and moist. Mucous membranes are dry.   Eyes: Conjunctivae, EOM and lids are normal. Pupils are equal, round, and reactive to light.  Neck: Full passive range of motion without pain.  Cardiovascular: Normal rate, regular rhythm, normal heart sounds and normal pulses. Exam reveals no gallop and no friction rub.  No murmur heard. Pulmonary/Chest: Effort normal and breath sounds normal.  No evidence of respiratory distress. Able to speak in full sentences without difficulty.  Abdominal: Soft. Normal appearance. She exhibits no distension. There is no tenderness. There is CVA tenderness (left). There is no rigidity and no guarding.  Abdomen is soft, non-distended. Diffuse tenderness, worse in the left lateral side but with no focal point. Very minimal CVA tenderness to the left side.   Musculoskeletal: Normal range of motion.  Neurological: She is alert and oriented to person, place, and time.  Skin: Skin is warm and dry. Capillary refill takes less than 2 seconds.  Psychiatric: She has a normal mood and affect. Her speech is normal.  Nursing note and vitals reviewed.    ED Treatments / Results  Labs (all labs ordered are listed, but only abnormal results are displayed) Labs Reviewed  URINALYSIS, ROUTINE W REFLEX MICROSCOPIC - Abnormal; Notable for the following components:      Result Value   Ketones, ur 5 (*)    Protein, ur 30 (*)    Bacteria, UA RARE (*)    Squamous Epithelial / LPF 6-30 (*)    All other components within normal limits  COMPREHENSIVE METABOLIC PANEL - Abnormal; Notable for the following components:   CO2 21 (*)    Glucose, Bld 100 (*)    ALT 12 (*)    Total Bilirubin 2.1 (*)    All other components within normal limits  CBC WITH DIFFERENTIAL/PLATELET - Abnormal; Notable for the following components:   Platelets 88 (*)    All other components within normal limits  HCG, QUANTITATIVE, PREGNANCY - Abnormal; Notable for the following components:   hCG, Beta Chain, Quant, S 34,909 (*)    All other  components within normal limits  POC URINE PREG, ED - Abnormal; Notable for the following components:   Preg Test, Ur POSITIVE (*)    All other components within normal limits  LIPASE, BLOOD    EKG  EKG Interpretation None       Radiology No results found.  Procedures Procedures (including critical care time)  Medications Ordered in ED Medications  sodium chloride 0.9 % bolus 1,000 mL (0 mLs Intravenous Stopped 08/14/17 1103)  ondansetron (ZOFRAN) injection 4 mg (4 mg Intravenous Given 08/14/17 1103)  sodium chloride 0.9 % bolus 500 mL (0 mLs Intravenous Stopped 08/14/17 1209)     Initial Impression / Assessment and Plan / ED Course  I have reviewed the triage vital signs and the nursing notes.  Pertinent labs & imaging results that  were available during my care of the patient were reviewed by me and considered in my medical decision making (see chart for details).     26 year old female who presents 4 days of left flank pain that radiates to the left lower quadrant.  Associated with nausea/vomiting, generalized myalgias.  Also reports subjective fever and chills.  No measured temperature. Patient is afebrile, non-toxic appearing, sitting comfortably on examination table. Vital signs reviewed and stable. Consider UTI vs acute infectious GI etiology vs pregnancy.  History/physical examination are not concerning for pyelonephritis, appendicitis or ovarian torsion.  Will plan to check basic labs. IVF given for fluid resuscitation.   Labs reviewed.  And pregnancy positive.  Will add hCG quantitative. UA shows no leuks, nitrites, pyuria.  CBC shows platelets at 88.  Records reviewed to the patient has a history of thrombocytopenia.  Recent lab work 4 months ago showed thrombocytopenia at that time also.  Otherwise unremarkable.  CMP unremarkable.  Lipase unremarkable.  Discussed results with patient.  She is currently sexually active with one partner.  They do not use protection.  She  denies any vaginal discharge, vaginal bleeding.  Patient does have an OB/GYN who she is scheduled to see in 2 days.   Repeat abdominal exam is benign.  Patient still very minimally tender at the CVA on the left side. Patient reports improvement in pain after fluids.  Patient still feels very nauseous.  Will give antiemetics for symptom medic control.  Patient has Zofran listed in allergies.  Patient states that she had been seen at [facility] several years ago and had some diffuse facial swelling after receiving multiple medications.  They were not able to differentiate which medication was causing the reaction and listed them all as an allergy.  We discussed the use of Zofran for nausea.  There is a low risk that Zofran was the cause of her symptoms.  We discussed risk first benefits of doing a trial of Zofran in the emergency department for evaluation of any allergic symptoms.  Patient understands full risks versus benefits and wishes to try Zofran at this time.  Reevaluation after Zofran.  Patient reports feeling better.  We will plan to p.o. challenge patient the department.  HCG quantitative is 34, 909. Benign abdominal exam. Exam not concerning for appendicitis vs diverticuliti Vitals stable.  Patient able to tolerate PO without difficulty. No reaction to the zofran. Will plan to dispo home with zofran for nausea control. Patient has an OB/GYN appointment scheduled for 11/20. Encouraged her to see OB/GYN as previosly scheduled. Patient is hemodynamically stable for discharge at this time. Patient had ample opportunity for questions and discussion. All patient's questions were answered with full understanding. Strict return precautions discussed. Patient expresses understanding and agreement to plan.    Final Clinical Impressions(s) / ED Diagnoses   Final diagnoses:  Nausea  Pregnancy, unspecified gestational age    ED Discharge Orders        Ordered    ondansetron (ZOFRAN)  4 MG tablet  Every 6 hours     08/14/17 1229       Rosana HoesLayden, Lindsey A, PA-C 08/16/17 2217    Jacalyn LefevreHaviland, Julie, MD 08/19/17 1609

## 2017-08-14 NOTE — ED Notes (Signed)
[facility] ago had urine test and preg test. Couldn't wait for room so left ama

## 2017-08-16 ENCOUNTER — Encounter: Payer: Self-pay | Admitting: Maternal Newborn

## 2017-08-16 ENCOUNTER — Ambulatory Visit (INDEPENDENT_AMBULATORY_CARE_PROVIDER_SITE_OTHER): Payer: Self-pay | Admitting: Maternal Newborn

## 2017-08-16 DIAGNOSIS — Z3480 Encounter for supervision of other normal pregnancy, unspecified trimester: Secondary | ICD-10-CM

## 2017-08-16 NOTE — Patient Instructions (Signed)
First Trimester of Pregnancy The first trimester of pregnancy is from week 1 until the end of week 13 (months 1 through 3). A week after a sperm fertilizes an egg, the egg will implant on the wall of the uterus. This embryo will begin to develop into a baby. Genes from you and your partner will form the baby. The female genes will determine whether the baby will be a boy or a girl. At 6-8 weeks, the eyes and face will be formed, and the heartbeat can be seen on ultrasound. At the end of 12 weeks, all the baby's organs will be formed. Now that you are pregnant, you will want to do everything you can to have a healthy baby. Two of the most important things are to get good prenatal care and to follow your health care provider's instructions. Prenatal care is all the medical care you receive before the baby's birth. This care will help prevent, find, and treat any problems during the pregnancy and childbirth. Body changes during your first trimester Your body goes through many changes during pregnancy. The changes vary from woman to woman.  You may gain or lose a couple of pounds at first.  You may feel sick to your stomach (nauseous) and you may throw up (vomit). If the vomiting is uncontrollable, call your health care provider.  You may tire easily.  You may develop headaches that can be relieved by medicines. All medicines should be approved by your health care provider.  You may urinate more often. Painful urination may mean you have a bladder infection.  You may develop heartburn as a result of your pregnancy.  You may develop constipation because certain hormones are causing the muscles that push stool through your intestines to slow down.  You may develop hemorrhoids or swollen veins (varicose veins).  Your breasts may begin to grow larger and become tender. Your nipples may stick out more, and the tissue that surrounds them (areola) may become darker.  Your gums may bleed and may be  sensitive to brushing and flossing.  Dark spots or blotches (chloasma, mask of pregnancy) may develop on your face. This will likely fade after the baby is born.  Your menstrual periods will stop.  You may have a loss of appetite.  You may develop cravings for certain kinds of food.  You may have changes in your emotions from day to day, such as being excited to be pregnant or being concerned that something may go wrong with the pregnancy and baby.  You may have more vivid and strange dreams.  You may have changes in your hair. These can include thickening of your hair, rapid growth, and changes in texture. Some women also have hair loss during or after pregnancy, or hair that feels dry or thin. Your hair will most likely return to normal after your baby is born.  What to expect at prenatal visits During a routine prenatal visit:  You will be weighed to make sure you and the baby are growing normally.  Your blood pressure will be taken.  Your abdomen will be measured to track your baby's growth.  The fetal heartbeat will be listened to between weeks 10 and 14 of your pregnancy.  Test results from any previous visits will be discussed.  Your health care provider may ask you:  How you are feeling.  If you are feeling the baby move.  If you have had any abnormal symptoms, such as leaking fluid, bleeding, severe headaches,   or abdominal cramping.  If you are using any tobacco products, including cigarettes, chewing tobacco, and electronic cigarettes.  If you have any questions.  Other tests that may be performed during your first trimester include:  Blood tests to find your blood type and to check for the presence of any previous infections. The tests will also be used to check for low iron levels (anemia) and protein on red blood cells (Rh antibodies). Depending on your risk factors, or if you previously had diabetes during pregnancy, you may have tests to check for high blood  sugar that affects pregnant women (gestational diabetes).  Urine tests to check for infections, diabetes, or protein in the urine.  An ultrasound to confirm the proper growth and development of the baby.  Fetal screens for spinal cord problems (spina bifida) and Down syndrome.  HIV (human immunodeficiency virus) testing. Routine prenatal testing includes screening for HIV, unless you choose not to have this test.  You may need other tests to make sure you and the baby are doing well.  Follow these instructions at home: Medicines  Follow your health care provider's instructions regarding medicine use. Specific medicines may be either safe or unsafe to take during pregnancy.  Take a prenatal vitamin that contains at least 600 micrograms (mcg) of folic acid.  If you develop constipation, try taking a stool softener if your health care provider approves. Eating and drinking  Eat a balanced diet that includes fresh fruits and vegetables, whole grains, good sources of protein such as meat, eggs, or tofu, and low-fat dairy. Your health care provider will help you determine the amount of weight gain that is right for you.  Avoid raw meat and uncooked cheese. These carry germs that can cause birth defects in the baby.  Eating four or five small meals rather than three large meals a day may help relieve nausea and vomiting. If you start to feel nauseous, eating a few soda crackers can be helpful. Drinking liquids between meals, instead of during meals, also seems to help ease nausea and vomiting.  Limit foods that are high in fat and processed sugars, such as fried and sweet foods.  To prevent constipation: ? Eat foods that are high in fiber, such as fresh fruits and vegetables, whole grains, and beans. ? Drink enough fluid to keep your urine clear or pale yellow. Activity  Exercise only as directed by your health care provider. Most women can continue their usual exercise routine during  pregnancy. Try to exercise for 30 minutes at least 5 days a week. Exercising will help you: ? Control your weight. ? Stay in shape. ? Be prepared for labor and delivery.  Experiencing pain or cramping in the lower abdomen or lower back is a good sign that you should stop exercising. Check with your health care provider before continuing with normal exercises.  Try to avoid standing for long periods of time. Move your legs often if you must stand in one place for a long time.  Avoid heavy lifting.  Wear low-heeled shoes and practice good posture.  You may continue to have sex unless your health care provider tells you not to. Relieving pain and discomfort  Wear a good support bra to relieve breast tenderness.  Take warm sitz baths to soothe any pain or discomfort caused by hemorrhoids. Use hemorrhoid cream if your health care provider approves.  Rest with your legs elevated if you have leg cramps or low back pain.  If you develop   varicose veins in your legs, wear support hose. Elevate your feet for 15 minutes, 3-4 times a day. Limit salt in your diet. Prenatal care  Schedule your prenatal visits by the twelfth week of pregnancy. They are usually scheduled monthly at first, then more often in the last 2 months before delivery.  Write down your questions. Take them to your prenatal visits.  Keep all your prenatal visits as told by your health care provider. This is important. Safety  Wear your seat belt at all times when driving.  Make a list of emergency phone numbers, including numbers for family, friends, the hospital, and police and fire departments. General instructions  Ask your health care provider for a referral to a local prenatal education class. Begin classes no later than the beginning of month 6 of your pregnancy.  Ask for help if you have counseling or nutritional needs during pregnancy. Your health care provider can offer advice or refer you to specialists for help  with various needs.  Do not use hot tubs, steam rooms, or saunas.  Do not douche or use tampons or scented sanitary pads.  Do not cross your legs for long periods of time.  Avoid cat litter boxes and soil used by cats. These carry germs that can cause birth defects in the baby and possibly loss of the fetus by miscarriage or stillbirth.  Avoid all smoking, herbs, alcohol, and medicines not prescribed by your health care provider. Chemicals in these products affect the formation and growth of the baby.  Do not use any products that contain nicotine or tobacco, such as cigarettes and e-cigarettes. If you need help quitting, ask your health care provider. You may receive counseling support and other resources to help you quit.  Schedule a dentist appointment. At home, brush your teeth with a soft toothbrush and be gentle when you floss. Contact a health care provider if:  You have dizziness.  You have mild pelvic cramps, pelvic pressure, or nagging pain in the abdominal area.  You have persistent nausea, vomiting, or diarrhea.  You have a bad smelling vaginal discharge.  You have pain when you urinate.  You notice increased swelling in your face, hands, legs, or ankles.  You are exposed to fifth disease or chickenpox.  You are exposed to German measles (rubella) and have never had it. Get help right away if:  You have a fever.  You are leaking fluid from your vagina.  You have spotting or bleeding from your vagina.  You have severe abdominal cramping or pain.  You have rapid weight gain or loss.  You vomit blood or material that looks like coffee grounds.  You develop a severe headache.  You have shortness of breath.  You have any kind of trauma, such as from a fall or a car accident. Summary  The first trimester of pregnancy is from week 1 until the end of week 13 (months 1 through 3).  Your body goes through many changes during pregnancy. The changes vary from  woman to woman.  You will have routine prenatal visits. During those visits, your health care provider will examine you, discuss any test results you may have, and talk with you about how you are feeling. This information is not intended to replace advice given to you by your health care provider. Make sure you discuss any questions you have with your health care provider. Document Released: 09/07/2001 Document Revised: 08/25/2016 Document Reviewed: 08/25/2016 [employer] Interactive Patient Education  2017 [employer]  

## 2017-08-16 NOTE — Progress Notes (Signed)
08/16/2017   Chief Complaint: Amenorrhea, positive home pregnancy test, desires prenatal care.  Transfer of Care Patient: no  History of Present Illness: Dominique Shea is a 26 y.o. [id number] 6741w6d based on Patient's last menstrual period was 06/29/2017. with an Estimated Date of Delivery: 04/05/18, with the above CC.   She had a Nexplanon device removed on 8/31 and then had one cycle in October. She was using no method when she conceived.  She has Positive signs or symptoms of nausea/vomiting of pregnancy. She has Negative signs or symptoms of miscarriage or preterm labor She identifies Negative Zika risk factors for her and her partner On any different medications around the time she conceived/early pregnancy: She was using marijuana to help with appetite. History of varicella: Yes   ROS: A 12-point review of systems was performed and negative, except as stated in the above HPI.  OBGYN History: As per HPI. OB History  Gravida Para Term Preterm AB Living  4 2 2   1 2   SAB TAB Ectopic Multiple Live Births  1       1    # Outcome Date GA Lbr Len/2nd Weight Sex Delivery Anes PTL Lv  4 Current           3 Term 09/23/11   7 lb (3.175 kg) F Vag-Spont  N LIV     Birth Comments: cholecystectomy during preg, thrombocytopenia  2 SAB           1 Term               Any issues with any prior pregnancies: yes, nausea and vomiting throughout pregnancy with G1, G2 SAB, G3 gestational thrombocytopenia Any prior children are healthy, doing well, without any problems or issues: yes History of pap smears: Yes. Last pap smear 08/01/2013. Abnormal: yes, ASCUS and HPV+ History of STIs: Yes, gonorrhea in September 2018   Past Medical History: Past Medical History:  Diagnosis Date  . 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 Clotting disorder (HCC)    Thrombocytopenia during first pregnancy  . History of gonorrhea 2014   TX C ROCEPHIN ON 6/25/1 IN OFFICE  . Thrombocytopenia (HCC)    DURING FIRST  PREG    Past Surgical History: Past Surgical History:  Procedure Laterality Date  . CHOLECYSTECTOMY  2012   [facility]  . FRACTURE SURGERY      Family History:  Family History  Problem Relation Age of Onset  . Hypertension Mother   . Migraines Mother   . Hypertension Father   . Migraines Father   . Migraines Sister   . Migraines Brother    She denies any female cancers, bleeding or blood clotting disorders.  She denies any history of intellectual disability, birth defects or genetic disorders in her or the FOB's history  Social History:  Social History   Socioeconomic History  . Marital status: Single    Spouse name: Not on file  . Number of children: 2  . Years of education: GED  . Highest education level: Not on file  Social Needs  . Financial resource strain: Not on file  . Food insecurity - worry: Not on file  . Food insecurity - inability: Not on file  . Transportation needs - medical: Not on file  . Transportation needs - non-medical: Not on file  Occupational History  . Not on file  Tobacco Use  . Smoking status: Current Every Day Smoker    Packs/day:  1.00    Years: 4.00    Pack years: 4.00    Types: Cigarettes  . Smokeless tobacco: Never Used  Substance and Sexual Activity  . Alcohol use: Yes    Comment: occ  . Drug use: Yes    Types: Marijuana  . Sexual activity: Yes    Birth control/protection: None  Other Topics Concern  . Not on file  Social History Narrative  . Not on file   Any cats in the household: no Denies history of and current domestic violence.  Allergy: Allergies  Allergen Reactions  . Peanut-Containing Drug Products Anaphylaxis  . Tomato Anaphylaxis  . Benadryl [Diphenhydramine]     Pt reports throat swelling  . Doxycycline     Pt had facial swelling after receiving ms04, zofran, hydrocodone, tylenol, ivp dye, and doxycline. Unclear which was responsible agent.   . Hydrocodone-Acetaminophen     Pt had facial swelling after  receiving ms04, zofran, hydrocodone, tylenol, ivp dye, and doxycline. Unclear which was responsible agent.   . Ibuprofen Swelling  . Ivp Dye [Iodinated Diagnostic Agents]     Pt had facial swelling after receiving ms04, zofran, hydrocodone, tylenol, ivp dye, and doxycline. Unclear which was responsible agent.   . Morphine And Related     Pt had facial swelling after receiving ms04, zofran, hydrocodone, tylenol, ivp dye, and doxycline. Unclear which was responsible agent.   . Rocephin [Ceftriaxone Sodium In Dextrose]     Pt had facial swelling after receiving ms04, zofran, hydrocodone, tylenol, ivp dye, and doxycline. Unclear which was responsible agent.     Current Outpatient Medications:  Current Outpatient Medications:  .  ondansetron (ZOFRAN) 4 MG tablet, Take 1 tablet (4 mg total) every 6 (six) hours by mouth., Disp: 12 tablet, Rfl: 0   Physical Exam:   BP 120/80   Wt 140 lb (63.5 kg)   LMP 06/29/2017   BMI 24.03 kg/m  Body mass index is 24.03 kg/m. Constitutional: Appears fatigued, no acute distress.  Neck:  Supple, normal appearance, and no thyromegaly  Cardiovascular: S1, S2 normal, no murmur, rub or gallop, regular rate and rhythm Respiratory:  Clear to auscultation bilateral. Normal respiratory effort Abdomen: positive bowel sounds and no masses, hernias; diffusely non tender to palpation, non distended Breasts: breasts appear normal, no suspicious masses, no skin or nipple changes or axillary nodes. Neuro/Psych:  Normal mood and affect.  Skin:  Warm and dry.  Lymphatic:  No inguinal lymphadenopathy.   Pelvic exam: is not limited by body habitus External genitalia, Bartholin's glands, Urethra, Skene's glands: within normal limits Vagina: within normal limits and with no blood in the vault  Cervix: normal appearing cervix without discharge or lesions, closed/long/high Uterus:  Enlarged consistent with early pregnancy Adnexa:  normal adnexa and no mass, fullness,  tenderness  Assessment: Dominique Shea is a 26 y.o. Z6X0960 [redacted]w[redacted]d based on Patient's last menstrual period was 06/29/2017. with an Estimated Date of Delivery: 04/05/18, presenting for prenatal care.  Plan:  1) Avoid alcoholic beverages. 2) Patient encouraged not to smoke. She quit a few days ago when she found out she was pregnant and says that the smell of smoke makes her nausea worse now, so she believes she can quit for this pregnancy. 3) Discontinue the use of all non-medicinal drugs and chemicals. Advised to stop using marijuana. 4) Take prenatal vitamins daily.  5) Seatbelt use advised 6) Nutrition, food safety (fish, cheese advisories, and high nitrite foods) and exercise discussed. 7) Hospital and practice style  [facility] discussed  8) Patient is asked about travel to areas at risk for the Zika virus, and counseled to avoid travel and exposure to mosquitoes or sexual partners who may have themselves been exposed to the virus. Testing is discussed, and will be ordered as appropriate.  9) [facility] advised 10) Genetic Screening, such as with 1st Trimester Screening, cell free fetal DNA, AFP testing, and Ultrasound, as well as with amniocentesis and CVS as appropriate, is discussed with patient. She plans to have genetic testing this pregnancy (First trimester screen). 11) Severe nausea and vomiting, unable to tolerate much by mouth. Diclegis sample given and Rx sent. Patient advised to call if it does not work to get refill for Zofran.  Problem list reviewed and updated.  Return in about 1 week (around 08/23/2017) for ROB following ultrasound.  Marcelyn BruinsJacelyn Schmid, CNM [facility], [facility] 08/16/2017  1:36 PM

## 2017-08-18 LAB — IGP,CTNGTV,RFX APTIMA HPV ASCU
CHLAMYDIA, NUC. ACID AMP: NEGATIVE
GONOCOCCUS, NUC. ACID AMP: POSITIVE — AB
PAP Smear Comment: 0
TRICH VAG BY NAA: NEGATIVE

## 2017-08-20 ENCOUNTER — Encounter (HOSPITAL_COMMUNITY): Payer: Self-pay | Admitting: Student

## 2017-08-20 ENCOUNTER — Inpatient Hospital Stay (HOSPITAL_COMMUNITY): Payer: Medicaid Other

## 2017-08-20 ENCOUNTER — Inpatient Hospital Stay (HOSPITAL_COMMUNITY)
Admission: AD | Admit: 2017-08-20 | Discharge: 2017-08-20 | Disposition: A | Discharge status: Home / Self Care | Payer: Medicaid Other | Source: Ambulatory Visit | Attending: Obstetrics and Gynecology | Admitting: Obstetrics and Gynecology

## 2017-08-20 DIAGNOSIS — Z8619 Personal history of other infectious and parasitic diseases: Secondary | ICD-10-CM | POA: Diagnosis not present

## 2017-08-20 DIAGNOSIS — Z79899 Other long term (current) drug therapy: Secondary | ICD-10-CM | POA: Insufficient documentation

## 2017-08-20 DIAGNOSIS — R112 Nausea with vomiting, unspecified: Secondary | ICD-10-CM | POA: Diagnosis present

## 2017-08-20 DIAGNOSIS — O99331 Smoking (tobacco) complicating pregnancy, first trimester: Secondary | ICD-10-CM | POA: Diagnosis not present

## 2017-08-20 DIAGNOSIS — O209 Hemorrhage in early pregnancy, unspecified: Secondary | ICD-10-CM

## 2017-08-20 DIAGNOSIS — Z91010 Allergy to peanuts: Secondary | ICD-10-CM | POA: Diagnosis not present

## 2017-08-20 DIAGNOSIS — Z91018 Allergy to other foods: Secondary | ICD-10-CM | POA: Insufficient documentation

## 2017-08-20 DIAGNOSIS — Z886 Allergy status to analgesic agent status: Secondary | ICD-10-CM | POA: Diagnosis not present

## 2017-08-20 DIAGNOSIS — Z3491 Encounter for supervision of normal pregnancy, unspecified, first trimester: Secondary | ICD-10-CM

## 2017-08-20 DIAGNOSIS — O26891 Other specified pregnancy related conditions, first trimester: Secondary | ICD-10-CM

## 2017-08-20 DIAGNOSIS — Z888 Allergy status to other drugs, medicaments and biological substances status: Secondary | ICD-10-CM | POA: Diagnosis not present

## 2017-08-20 DIAGNOSIS — R109 Unspecified abdominal pain: Secondary | ICD-10-CM | POA: Diagnosis present

## 2017-08-20 DIAGNOSIS — O219 Vomiting of pregnancy, unspecified: Secondary | ICD-10-CM | POA: Diagnosis not present

## 2017-08-20 DIAGNOSIS — Z9889 Other specified postprocedural states: Secondary | ICD-10-CM | POA: Insufficient documentation

## 2017-08-20 DIAGNOSIS — F17210 Nicotine dependence, cigarettes, uncomplicated: Secondary | ICD-10-CM | POA: Insufficient documentation

## 2017-08-20 DIAGNOSIS — Z9049 Acquired absence of other specified parts of digestive tract: Secondary | ICD-10-CM | POA: Insufficient documentation

## 2017-08-20 DIAGNOSIS — Z8249 Family history of ischemic heart disease and other diseases of the circulatory system: Secondary | ICD-10-CM | POA: Diagnosis not present

## 2017-08-20 DIAGNOSIS — O4692 Antepartum hemorrhage, unspecified, second trimester: Secondary | ICD-10-CM | POA: Diagnosis not present

## 2017-08-20 DIAGNOSIS — Z3A01 Less than 8 weeks gestation of pregnancy: Secondary | ICD-10-CM | POA: Diagnosis not present

## 2017-08-20 DIAGNOSIS — Z820 Family history of epilepsy and other diseases of the nervous system: Secondary | ICD-10-CM | POA: Insufficient documentation

## 2017-08-20 DIAGNOSIS — Z881 Allergy status to other antibiotic agents status: Secondary | ICD-10-CM | POA: Insufficient documentation

## 2017-08-20 DIAGNOSIS — Z885 Allergy status to narcotic agent status: Secondary | ICD-10-CM | POA: Insufficient documentation

## 2017-08-20 LAB — URINALYSIS, ROUTINE W REFLEX MICROSCOPIC
BACTERIA UA: NONE SEEN
Bilirubin Urine: NEGATIVE
Glucose, UA: NEGATIVE mg/dL
Hgb urine dipstick: NEGATIVE
Ketones, ur: 80 mg/dL — AB
Leukocytes, UA: NEGATIVE
Nitrite: NEGATIVE
PROTEIN: 30 mg/dL — AB
Specific Gravity, Urine: 1.031 — ABNORMAL HIGH (ref 1.005–1.030)
pH: 5 (ref 5.0–8.0)

## 2017-08-20 LAB — CBC
HEMATOCRIT: 39.5 % (ref 36.0–46.0)
Hemoglobin: 13.6 g/dL (ref 12.0–15.0)
MCH: 31.4 pg (ref 26.0–34.0)
MCHC: 34.4 g/dL (ref 30.0–36.0)
MCV: 91.2 fL (ref 78.0–100.0)
Platelets: 110 10*3/uL — ABNORMAL LOW (ref 150–400)
RBC: 4.33 MIL/uL (ref 3.87–5.11)
RDW: 12.2 % (ref 11.5–15.5)
WBC: 10.5 10*3/uL (ref 4.0–10.5)

## 2017-08-20 LAB — BASIC METABOLIC PANEL
Anion gap: 10 (ref 5–15)
BUN: 17 mg/dL (ref 6–20)
CALCIUM: 9.6 mg/dL (ref 8.9–10.3)
CO2: 22 mmol/L (ref 22–32)
Chloride: 105 mmol/L (ref 101–111)
Creatinine, Ser: 0.8 mg/dL (ref 0.44–1.00)
GFR calc Af Amer: >60 mL/min (ref >60)
GFR calc non Af Amer: >60 mL/min (ref >60)
GLUCOSE: 95 mg/dL (ref 65–99)
Potassium: 3.6 mmol/L (ref 3.5–5.1)
Sodium: 137 mmol/L (ref 135–145)

## 2017-08-20 LAB — HCG, QUANTITATIVE, PREGNANCY: HCG, BETA CHAIN, QUANT, S: 77568 m[IU]/mL — AB (ref <5)

## 2017-08-20 LAB — ABO/RH: ABO/RH(D): A POS

## 2017-08-20 MED ORDER — LACTATED RINGERS IV BOLUS (SEPSIS)
1000.0000 mL | Freq: Once | INTRAVENOUS | Status: AC
  Administered 2017-08-20: 1000 mL via INTRAVENOUS

## 2017-08-20 MED ORDER — SCOPOLAMINE 1 MG/3DAYS TD PT72
1.0000 | MEDICATED_PATCH | Freq: Once | TRANSDERMAL | Status: DC
  Administered 2017-08-20: 1.5 mg via TRANSDERMAL
  Filled 2017-08-20: qty 1

## 2017-08-20 MED ORDER — DEXTROSE 5 % IN LACTATED RINGERS IV BOLUS
1000.0000 mL | Freq: Once | INTRAVENOUS | Status: AC
  Administered 2017-08-20 (×2): 1000 mL via INTRAVENOUS

## 2017-08-20 MED ORDER — METOCLOPRAMIDE HCL 10 MG PO TABS: 10.0000 mg | ORAL_TABLET | Freq: Three times a day (TID) | ORAL | 0 refills | Status: DC | PRN

## 2017-08-20 MED ORDER — METOCLOPRAMIDE HCL 5 MG/ML IJ SOLN
10.0000 mg | Freq: Once | INTRAMUSCULAR | Status: AC
  Administered 2017-08-20: 10 mg via INTRAVENOUS
  Filled 2017-08-20: qty 2

## 2017-08-20 NOTE — Progress Notes (Addendum)
G4P2 @ [redacted] wksga. Presents to triage via ambulance for "little bit of bleeding" when wiped on the toilet papter. States did not see bleeding when used bathroom here. Last period was Oct 3. Seen in [place] [facility] couple days ago and did a pregnancy test and was +.   Provider at bs assessing.   1250: medicated. Lab at bs  Drinks and cracker provided.  1520: pt states feeling much better and tolerated drink and crackers. Provider made aware  1535: provider at bs reassessing. New order for scop. Patch.   1620: Patch placed behind ear. IV d/c'd.   D/c instructions given with pt understanding. Pt left unit via ambulatory.

## 2017-08-20 NOTE — Discharge Instructions (Signed)
Morning Sickness °Morning sickness is when you feel sick to your stomach (nauseous) during pregnancy. This nauseous feeling may or may not come with vomiting. It often occurs in the morning but can be a problem any time of day. Morning sickness is most common during the first trimester, but it may continue throughout pregnancy. While morning sickness is unpleasant, it is usually harmless unless you develop severe and continual vomiting (hyperemesis gravidarum). This condition requires more intense treatment. °What are the causes? °The cause of morning sickness is not completely known but seems to be related to normal hormonal changes that occur in pregnancy. °What increases the risk? °You are at greater risk if you: °· Experienced nausea or vomiting before your pregnancy. °· Had morning sickness during a previous pregnancy. °· Are pregnant with more than one baby, such as twins. ° °How is this treated? °Do not use any medicines (prescription, over-the-counter, or herbal) for morning sickness without first talking to your health care provider. Your health care provider may prescribe or recommend: °· Vitamin B6 supplements. °· Anti-nausea medicines. °· The herbal medicine ginger. ° °Follow these instructions at home: °· Only take over-the-counter or prescription medicines as directed by your health care provider. °· Taking multivitamins before getting pregnant can prevent or decrease the severity of morning sickness in most women. °· Eat a piece of dry toast or unsalted crackers before getting out of bed in the morning. °· Eat five or six small meals a day. °· Eat dry and bland foods (rice, baked potato). Foods high in carbohydrates are often helpful. °· Do not drink liquids with your meals. Drink liquids between meals. °· Avoid greasy, fatty, and spicy foods. °· Get someone to cook for you if the smell of any food causes nausea and vomiting. °· If you feel nauseous after taking prenatal vitamins, take the vitamins at  night or with a snack. °· Snack on protein foods (nuts, yogurt, cheese) between meals if you are hungry. °· Eat unsweetened gelatins for desserts. °· Wearing an acupressure wristband (worn for sea sickness) may be helpful. °· Acupuncture may be helpful. °· Do not smoke. °· Get a humidifier to keep the air in your house free of odors. °· Get plenty of fresh air. °Contact a health care provider if: °· Your home remedies are not working, and you need medicine. °· You feel dizzy or lightheaded. °· You are losing weight. °Get help right away if: °· You have persistent and uncontrolled nausea and vomiting. °· You pass out (faint). °This information is not intended to replace advice given to you by your health care provider. Make sure you discuss any questions you have with your health care provider. °Document Released: 11/04/2006 Document Revised: 02/19/2016 Document Reviewed: 02/28/2013 °Elsevier Interactive Patient Education © 2017 [employer] ° °

## 2017-08-20 NOTE — MAU Provider Note (Signed)
History     CSN: [id number]  Arrival date and time: 08/20/17 1154   First Provider Initiated Contact with Patient 08/20/17 1225      Chief Complaint  Patient presents with  . Nausea  . Emesis  . Vaginal Bleeding  . Abdominal Pain   HPI Dominique Shea is a 26 y.o. [phone] at 7066w3d by LMP who presents via EMS for nausea/vomiting, abdominal pain, & vaginal bleeding. [facility]; had HCG >30,000, no ultrasound done. [facility]; ultrasound scheduled for next week.  Reports nausea & vomiting x 4 days. States she hasn't been able to keep down food for the last 3 days & states she feels "extremely dry". Was given sample of diclegis by her ob but states she's almost out of the pills & have not been helping.  This morning noticed pink spotting on toilet paper. This occurred after a bowel movement; pt insists that blood came from vagina & not rectum. Bleeding has not continued.  Endorses lower abdominal cramping. Rates pain 5/10. Has not treated pain. Thinks pain is due to being hungry.  Denies dysuria, vaginal discharge, recent intercourse, diarrhea, or fever/chills.   OB History    Gravida Para Term Preterm AB Living   4 2 2   1 2    SAB TAB Ectopic Multiple Live Births   1       1      Past Medical History:  Diagnosis Date  . 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 History of gonorrhea 2014  . Thrombocytopenia (HCC)    DURING FIRST PREG    Past Surgical History:  Procedure Laterality Date  . CHOLECYSTECTOMY  2012   [facility]  . FRACTURE SURGERY      Family History  Problem Relation Age of Onset  . Hypertension Mother   . Migraines Mother   . Hypertension Father   . Migraines Father   . Migraines Sister   . Migraines Brother     Social History   Tobacco Use  . Smoking status: Current Every Day Smoker    Packs/day: 1.00    Years: 4.00    Pack years: 4.00    Types: Cigarettes  .  Smokeless tobacco: Never Used  Substance Use Topics  . Alcohol use: Yes    Comment: occ  . Drug use: Yes    Types: Marijuana    Allergies:  Allergies  Allergen Reactions  . Peanut-Containing Drug Products Anaphylaxis  . Tomato Anaphylaxis  . Benadryl [Diphenhydramine]     Pt reports throat swelling  . Doxycycline     Pt had facial swelling after receiving ms04, zofran, hydrocodone, tylenol, ivp dye, and doxycline. Unclear which was responsible agent.   . Hydrocodone-Acetaminophen     Pt had facial swelling after receiving ms04, zofran, hydrocodone, tylenol, ivp dye, and doxycline. Unclear which was responsible agent.   . Ibuprofen Swelling  . Ivp Dye [Iodinated Diagnostic Agents]     Pt had facial swelling after receiving ms04, zofran, hydrocodone, tylenol, ivp dye, and doxycline. Unclear which was responsible agent.   . Morphine And Related     Pt had facial swelling after receiving ms04, zofran, hydrocodone, tylenol, ivp dye, and doxycline. Unclear which was responsible agent.   . Rocephin [Ceftriaxone Sodium In Dextrose]     Pt had facial swelling after receiving ms04, zofran, hydrocodone, tylenol, ivp dye, and doxycline. Unclear  which was responsible agent.     Medications Prior to Admission  Medication Sig Dispense Refill Last Dose  . ondansetron (ZOFRAN) 4 MG tablet Take 1 tablet (4 mg total) every 6 (six) hours by mouth. 12 tablet 0     Review of Systems  Constitutional: Negative.   Gastrointestinal: Positive for abdominal pain, nausea and vomiting. Negative for anal bleeding, blood in stool, constipation and diarrhea.  Genitourinary: Positive for vaginal bleeding. Negative for dysuria and vaginal discharge.   Physical Exam   Blood pressure 124/88, pulse 68, temperature 97.6 F (36.4 C), temperature source Oral, resp. rate 16, last menstrual period 06/29/2017.  Physical Exam  Nursing note and vitals reviewed. Constitutional: She is oriented to person, place, and  time. She appears well-developed and well-nourished. No distress.  HENT:  Head: Normocephalic and atraumatic.  Mouth/Throat: Mucous membranes are dry.  Eyes: Conjunctivae are normal. Right eye exhibits no discharge. Left eye exhibits no discharge. No scleral icterus.  Neck: Normal range of motion.  Respiratory: Effort normal. No respiratory distress.  GI: Soft. She exhibits no distension. There is no tenderness. There is no rebound and no guarding.  Genitourinary: No bleeding in the vagina.  Neurological: She is alert and oriented to person, place, and time.  Skin: Skin is warm and dry. She is not diaphoretic.  Psychiatric: She has a normal mood and affect. Her behavior is normal. Judgment and thought content normal.    MAU Course  Procedures Results for orders placed or performed during the hospital encounter of 08/20/17 (from the past 24 hour(s))  Urinalysis, Routine w reflex microscopic     Status: Abnormal   Collection Time: 08/20/17 11:58 AM  Result Value Ref Range   Color, Urine AMBER (A) YELLOW   APPearance CLEAR CLEAR   Specific Gravity, Urine 1.031 (H) 1.005 - 1.030   pH 5.0 5.0 - 8.0   Glucose, UA NEGATIVE NEGATIVE mg/dL   Hgb urine dipstick NEGATIVE NEGATIVE   Bilirubin Urine NEGATIVE NEGATIVE   Ketones, ur 80 (A) NEGATIVE mg/dL   Protein, ur 30 (A) NEGATIVE mg/dL   Nitrite NEGATIVE NEGATIVE   Leukocytes, UA NEGATIVE NEGATIVE   RBC / HPF 0-5 0 - 5 RBC/hpf   WBC, UA 0-5 0 - 5 WBC/hpf   Bacteria, UA NONE SEEN NONE SEEN   Squamous Epithelial / LPF 0-5 (A) NONE SEEN   Mucus PRESENT   ABO/Rh     Status: None   Collection Time: 08/20/17 12:48 PM  Result Value Ref Range   ABO/RH(D) A POS   hCG, quantitative, pregnancy     Status: Abnormal   Collection Time: 08/20/17 12:48 PM  Result Value Ref Range   hCG, Beta Chain, Quant, S 77,568 (H) <5 mIU/mL  Basic metabolic panel     Status: None   Collection Time: 08/20/17 12:48 PM  Result Value Ref Range   Sodium 137 135 -  145 mmol/L   Potassium 3.6 3.5 - 5.1 mmol/L   Chloride 105 101 - 111 mmol/L   CO2 22 22 - 32 mmol/L   Glucose, Bld 95 65 - 99 mg/dL   BUN 17 6 - 20 mg/dL   Creatinine, Ser 1.610.80 0.44 - 1.00 mg/dL   Calcium 9.6 8.9 - 09.610.3 mg/dL   GFR calc non Af Amer >60 >60 mL/min   GFR calc Af Amer >60 >60 mL/min   Anion gap 10 5 - 15  CBC     Status: Abnormal   Collection Time: 08/20/17 12:48  PM  Result Value Ref Range   WBC 10.5 4.0 - 10.5 K/uL   RBC 4.33 3.87 - 5.11 MIL/uL   Hemoglobin 13.6 12.0 - 15.0 g/dL   HCT 82.9 56.2 - 13.0 %   MCV 91.2 78.0 - 100.0 fL   MCH 31.4 26.0 - 34.0 pg   MCHC 34.4 30.0 - 36.0 g/dL   RDW 86.5 78.4 - 69.6 %   Platelets 110 (L) 150 - 400 K/uL   US Ob Comp Less 14 Wks  Result Date: 08/20/2017 CLINICAL DATA:  Pregnant patient with abdominal cramping. EXAM: OBSTETRIC <14 WK [place] AND TRANSVAGINAL OB US TECHNIQUE: Both transabdominal and transvaginal ultrasound examinations were performed for complete evaluation of the gestation as well as the maternal uterus, adnexal regions, and pelvic cul-de-sac. Transvaginal technique was performed to assess early pregnancy. COMPARISON:  CT abdomen pelvis 02/21/2017 FINDINGS: Intrauterine gestational sac: Single Yolk sac:  Visualized. Embryo:  Visualized. Cardiac Activity: Visualized. Heart Rate: 123  bpm CRL:  8.7  mm   6 w   5 d                  [place] EDC: 04/10/2018 Subchorionic hemorrhage:  None visualized. Maternal uterus/adnexae: Normal right and left ovaries. Trace free fluid in the pelvis. IMPRESSION: Single live intrauterine gestation.  No subchorionic hemorrhage. Electronically Signed   By: Annia Belt M.D.   On: 08/20/2017 14:10   US Ob Transvaginal  Result Date: 08/20/2017 CLINICAL DATA:  Pregnant patient with abdominal cramping. EXAM: OBSTETRIC <14 WK [place] AND TRANSVAGINAL OB US TECHNIQUE: Both transabdominal and transvaginal ultrasound examinations were performed for complete evaluation of the gestation as well as the maternal  uterus, adnexal regions, and pelvic cul-de-sac. Transvaginal technique was performed to assess early pregnancy. COMPARISON:  CT abdomen pelvis 02/21/2017 FINDINGS: Intrauterine gestational sac: Single Yolk sac:  Visualized. Embryo:  Visualized. Cardiac Activity: Visualized. Heart Rate: 123  bpm CRL:  8.7  mm   6 w   5 d                  [place] EDC: 04/10/2018 Subchorionic hemorrhage:  None visualized. Maternal uterus/adnexae: Normal right and left ovaries. Trace free fluid in the pelvis. IMPRESSION: Single live intrauterine gestation.  No subchorionic hemorrhage. Electronically Signed   By: Annia Belt M.D.   On: 08/20/2017 14:10    MDM +UPT UA,CBC, ABO/Rh, quant hCG, HIV, and [place] today to rule out ectopic pregnancy BMP IV fluids -- LR bolus followed by D5LR Reglan 10 mg IV Scop patch applied prior to discharge  Ultrasound shows SIUP with cardiac activity Blood type is A positive  Pt able to tolerate crackers & ginger ale prior to discharge Assessment and Plan  A: 1. Normal IUP (intrauterine pregnancy) on prenatal ultrasound, first trimester   2. Vaginal bleeding in pregnancy, first trimester   3. Abdominal pain during pregnancy in first trimester   4. Nausea and vomiting during pregnancy prior to [redacted] weeks gestation    P: Discharge home Rx reglan F/u with OB/gyn as scheduled Discussed reasons to return to MAU  Dominique Shea 08/20/2017, 12:26 PM

## 2017-08-21 LAB — URINE DRUG PANEL 7
Amphetamines, Urine: NEGATIVE ng/mL
BENZODIAZEPINE QUANT UR: NEGATIVE ng/mL
Barbiturate Quant, Ur: NEGATIVE ng/mL
Cannabinoid Quant, Ur: POSITIVE — AB
Cocaine (Metab.): NEGATIVE ng/mL
OPIATE QUANT UR: NEGATIVE ng/mL
PCP QUANT UR: NEGATIVE ng/mL

## 2017-08-21 LAB — URINE CULTURE

## 2017-08-22 ENCOUNTER — Emergency Department
Admission: EM | Admit: 2017-08-22 | Discharge: 2017-08-22 | Disposition: A | Discharge status: Home / Self Care | Payer: Medicaid Other | Attending: Student in an Organized Health Care Education/Training Program | Admitting: Student in an Organized Health Care Education/Training Program

## 2017-08-22 ENCOUNTER — Other Ambulatory Visit: Payer: Self-pay

## 2017-08-22 ENCOUNTER — Encounter: Payer: Self-pay | Admitting: Intensive Care

## 2017-08-22 DIAGNOSIS — Z9049 Acquired absence of other specified parts of digestive tract: Secondary | ICD-10-CM | POA: Diagnosis not present

## 2017-08-22 DIAGNOSIS — Z91010 Allergy to peanuts: Secondary | ICD-10-CM | POA: Insufficient documentation

## 2017-08-22 DIAGNOSIS — F17210 Nicotine dependence, cigarettes, uncomplicated: Secondary | ICD-10-CM | POA: Diagnosis not present

## 2017-08-22 DIAGNOSIS — O218 Other vomiting complicating pregnancy: Secondary | ICD-10-CM | POA: Insufficient documentation

## 2017-08-22 DIAGNOSIS — O99332 Smoking (tobacco) complicating pregnancy, second trimester: Secondary | ICD-10-CM | POA: Insufficient documentation

## 2017-08-22 DIAGNOSIS — O219 Vomiting of pregnancy, unspecified: Secondary | ICD-10-CM | POA: Diagnosis present

## 2017-08-22 DIAGNOSIS — Z3A22 22 weeks gestation of pregnancy: Secondary | ICD-10-CM | POA: Insufficient documentation

## 2017-08-22 LAB — COMPREHENSIVE METABOLIC PANEL
ALK PHOS: 35 U/L — AB (ref 38–126)
ALT: 12 U/L — ABNORMAL LOW (ref 14–54)
ANION GAP: 7 (ref 5–15)
AST: 20 U/L (ref 15–41)
Albumin: 4.2 g/dL (ref 3.5–5.0)
BILIRUBIN TOTAL: 2.3 mg/dL — AB (ref 0.3–1.2)
BUN: 11 mg/dL (ref 6–20)
CALCIUM: 9.2 mg/dL (ref 8.9–10.3)
CO2: 21 mmol/L — ABNORMAL LOW (ref 22–32)
Chloride: 106 mmol/L (ref 101–111)
Creatinine, Ser: 0.7 mg/dL (ref 0.44–1.00)
GFR calc Af Amer: >60 mL/min (ref >60)
GFR calc non Af Amer: >60 mL/min (ref >60)
GLUCOSE: 107 mg/dL — AB (ref 65–99)
Potassium: 3.6 mmol/L (ref 3.5–5.1)
Sodium: 134 mmol/L — ABNORMAL LOW (ref 135–145)
TOTAL PROTEIN: 7.2 g/dL (ref 6.5–8.1)

## 2017-08-22 LAB — CBC WITH DIFFERENTIAL/PLATELET
Basophils Absolute: 0 10*3/uL (ref 0–0.1)
Basophils Relative: 1 %
EOS PCT: 0 %
Eosinophils Absolute: 0 10*3/uL (ref 0–0.7)
HEMATOCRIT: 38.3 % (ref 35.0–47.0)
HEMOGLOBIN: 13.4 g/dL (ref 12.0–16.0)
LYMPHS ABS: 1.3 10*3/uL (ref 1.0–3.6)
LYMPHS PCT: 15 %
MCH: 31.7 pg (ref 26.0–34.0)
MCHC: 34.9 g/dL (ref 32.0–36.0)
MCV: 90.8 fL (ref 80.0–100.0)
MONO ABS: 0.7 10*3/uL (ref 0.2–0.9)
Monocytes Relative: 9 %
Neutro Abs: 6.4 10*3/uL (ref 1.4–6.5)
Neutrophils Relative %: 75 %
Platelets: 103 10*3/uL — ABNORMAL LOW (ref 150–440)
RBC: 4.22 MIL/uL (ref 3.80–5.20)
RDW: 12.3 % (ref 11.5–14.5)
WBC: 8.4 10*3/uL (ref 3.6–11.0)

## 2017-08-22 LAB — URINALYSIS, COMPLETE (UACMP) WITH MICROSCOPIC
BILIRUBIN URINE: NEGATIVE
Bacteria, UA: NONE SEEN
Glucose, UA: NEGATIVE mg/dL
Hgb urine dipstick: NEGATIVE
Ketones, ur: 20 mg/dL — AB
LEUKOCYTES UA: NEGATIVE
Nitrite: NEGATIVE
PH: 7 (ref 5.0–8.0)
Protein, ur: 30 mg/dL — AB
SPECIFIC GRAVITY, URINE: 1.021 (ref 1.005–1.030)

## 2017-08-22 LAB — POCT PREGNANCY, URINE: PREG TEST UR: POSITIVE — AB

## 2017-08-22 LAB — HCG, QUANTITATIVE, PREGNANCY: hCG, Beta Chain, Quant, S: 108136 m[IU]/mL — ABNORMAL HIGH (ref <5)

## 2017-08-22 MED ORDER — DEXTROSE 5 % AND 0.45 % NACL IV BOLUS
1000.0000 mL | Freq: Once | INTRAVENOUS | Status: AC
  Administered 2017-08-22: 1000 mL via INTRAVENOUS

## 2017-08-22 MED ORDER — PROMETHAZINE HCL 25 MG/ML IJ SOLN
12.5000 mg | Freq: Four times a day (QID) | INTRAMUSCULAR | Status: DC | PRN
  Administered 2017-08-22: 12.5 mg via INTRAVENOUS
  Filled 2017-08-22: qty 1

## 2017-08-22 NOTE — ED Notes (Signed)
Pt given ginger ale per request. Pt ambulatory with steady gait. Denies pain, states feels better.

## 2017-08-22 NOTE — ED Notes (Signed)
E-signature box not working. Pt verbalized understanding of discharge instructions and denied questions. 

## 2017-08-22 NOTE — ED Provider Notes (Signed)
[facility] Emergency Department Provider Note    First MD Initiated Contact with Patient 08/22/17 [phone]     (approximate)  I have reviewed the triage vital signs and the nursing notes.   HISTORY  Chief Complaint Morning Sickness    HPI Dominique Shea is a 26 y.o. female presents with chief complaint of nausea and vomiting in early pregnancy.  Patient is roughly [redacted] weeks pregnant P.  Just had a recent ultrasound confirming intrauterine pregnancy with identifiable fetal heart tones.  Denies any vaginal discharge or bleeding.  States she was discharged after recent visit to [facility] in [place] for nausea and vomiting.  She is discharged with Reglan but has not gotten this filled.  Denies any dysuria.  No fevers.  No chest pain or shortness of breath.  States that she has been trying to drink Gatorade but feels nauseated every time she drinks this.  Past Medical History:  Diagnosis Date  . 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 History of gonorrhea 2014  . Thrombocytopenia (HCC)    DURING FIRST PREG   Family History  Problem Relation Age of Onset  . Hypertension Mother   . Migraines Mother   . Hypertension Father   . Migraines Father   . Migraines Sister   . Migraines Brother    Past Surgical History:  Procedure Laterality Date  . CHOLECYSTECTOMY  2012   [facility]  . FRACTURE SURGERY     Patient Active Problem List   Diagnosis Date Noted  . Supervision of other normal pregnancy, antepartum 08/16/2017  . Thrombocytopenia (HCC) 07/12/2011      Prior to Admission medications   Medication Sig Start Date End Date Taking? Authorizing Provider  Doxylamine-Pyridoxine (DICLEGIS) 10-10 MG TBEC Take 1 tablet by mouth 2 (two) times daily as needed (For nausea.).    [provider]  metoCLOPramide (REGLAN) 10 MG tablet Take 1 tablet (10 mg total) by mouth every 8 (eight) hours as needed for nausea. 08/20/17   Judeth Horn, NP     Allergies Peanut-containing drug products; Tomato; Benadryl [diphenhydramine]; Doxycycline; Hydrocodone-acetaminophen; Ibuprofen; Ivp dye [iodinated diagnostic agents]; Morphine and related; and Rocephin [ceftriaxone sodium in dextrose]    Social History Social History   Tobacco Use  . Smoking status: Current Every Day Smoker    Packs/day: 1.00    Years: 4.00    Pack years: 4.00    Types: Cigarettes  . Smokeless tobacco: Never Used  Substance Use Topics  . Alcohol use: Yes    Comment: occ  . Drug use: Yes    Types: Marijuana    Review of Systems Patient denies headaches, rhinorrhea, blurry vision, numbness, shortness of breath, chest pain, edema, cough, abdominal pain, nausea, vomiting, diarrhea, dysuria, fevers, rashes or hallucinations unless otherwise stated above in HPI. ____________________________________________   PHYSICAL EXAM:  VITAL SIGNS: Vitals:   08/22/17 0910 08/22/17 1130  BP: (!) 142/87 102/68  Pulse: 79 64  Resp: 16   Temp: 98.5 F (36.9 C)   SpO2: 98% 99%    Constitutional: Alert and oriented. Well appearing and in no acute distress. Eyes: Conjunctivae are normal.  Head: Atraumatic. Nose: No congestion/rhinnorhea. Mouth/Throat: Mucous membranes are moist.   Neck: No stridor. Painless ROM.  Cardiovascular: Normal rate, regular rhythm. Grossly normal heart sounds.  Good peripheral circulation. Respiratory: Normal respiratory effort.  No retractions. Lungs CTAB. Gastrointestinal: Soft and nontender. No distention. No abdominal bruits. No CVA tenderness. Genitourinary:  Musculoskeletal: No lower extremity tenderness nor edema.  No joint effusions. Neurologic:  Normal speech and language. No gross focal neurologic deficits are appreciated. No facial droop Skin:  Skin is warm, dry and intact. No rash noted. Psychiatric: Mood and affect are normal. Speech and behavior are normal.  ____________________________________________   LABS (all labs  ordered are listed, but only abnormal results are displayed)  Results for orders placed or performed during the hospital encounter of 08/22/17 (from the past 24 hour(s))  CBC with Differential     Status: Abnormal   Collection Time: 08/22/17  9:17 AM  Result Value Ref Range   WBC 8.4 3.6 - 11.0 K/uL   RBC 4.22 3.80 - 5.20 MIL/uL   Hemoglobin 13.4 12.0 - 16.0 g/dL   HCT 44.038.3 10.235.0 - 72.547.0 %   MCV 90.8 80.0 - 100.0 fL   MCH 31.7 26.0 - 34.0 pg   MCHC 34.9 32.0 - 36.0 g/dL   RDW 36.612.3 44.011.5 - 34.714.5 %   Platelets 103 (L) 150 - 440 K/uL   Neutrophils Relative % 75 %   Neutro Abs 6.4 1.4 - 6.5 K/uL   Lymphocytes Relative 15 %   Lymphs Abs 1.3 1.0 - 3.6 K/uL   Monocytes Relative 9 %   Monocytes Absolute 0.7 0.2 - 0.9 K/uL   Eosinophils Relative 0 %   Eosinophils Absolute 0.0 0 - 0.7 K/uL   Basophils Relative 1 %   Basophils Absolute 0.0 0 - 0.1 K/uL  Comprehensive metabolic panel     Status: Abnormal   Collection Time: 08/22/17  9:17 AM  Result Value Ref Range   Sodium 134 (L) 135 - 145 mmol/L   Potassium 3.6 3.5 - 5.1 mmol/L   Chloride 106 101 - 111 mmol/L   CO2 21 (L) 22 - 32 mmol/L   Glucose, Bld 107 (H) 65 - 99 mg/dL   BUN 11 6 - 20 mg/dL   Creatinine, Ser 4.250.70 0.44 - 1.00 mg/dL   Calcium 9.2 8.9 - 95.610.3 mg/dL   Total Protein 7.2 6.5 - 8.1 g/dL   Albumin 4.2 3.5 - 5.0 g/dL   AST 20 15 - 41 U/L   ALT 12 (L) 14 - 54 U/L   Alkaline Phosphatase 35 (L) 38 - 126 U/L   Total Bilirubin 2.3 (H) 0.3 - 1.2 mg/dL   GFR calc non Af Amer >60 >60 mL/min   GFR calc Af Amer >60 >60 mL/min   Anion gap 7 5 - 15  Urinalysis, Complete w Microscopic     Status: Abnormal   Collection Time: 08/22/17  9:17 AM  Result Value Ref Range   Color, Urine YELLOW (A) YELLOW   APPearance HAZY (A) CLEAR   Specific Gravity, Urine 1.021 1.005 - 1.030   pH 7.0 5.0 - 8.0   Glucose, UA NEGATIVE NEGATIVE mg/dL   Hgb urine dipstick NEGATIVE NEGATIVE   Bilirubin Urine NEGATIVE NEGATIVE   Ketones, ur 20 (A)  NEGATIVE mg/dL   Protein, ur 30 (A) NEGATIVE mg/dL   Nitrite NEGATIVE NEGATIVE   Leukocytes, UA NEGATIVE NEGATIVE   RBC / HPF 0-5 0 - 5 RBC/hpf   WBC, UA 6-30 0 - 5 WBC/hpf   Bacteria, UA NONE SEEN NONE SEEN   Squamous Epithelial / LPF 6-30 (A) NONE SEEN   Mucus PRESENT   hCG, quantitative, pregnancy     Status: Abnormal   Collection Time: 08/22/17  9:17 AM  Result Value Ref Range   hCG, Beta Chain,  Mahalia LongestQuant, S [id number] (H) <5 mIU/mL  Pregnancy, urine POC     Status: Abnormal   Collection Time: 08/22/17  9:27 AM  Result Value Ref Range   Preg Test, Ur POSITIVE (A) NEGATIVE   ____________________________________________ ____________________________________________  RADIOLOGY   ____________________________________________   PROCEDURES  Procedure(s) performed:  Procedures    Critical Care performed: no ____________________________________________   INITIAL IMPRESSION / ASSESSMENT AND PLAN / ED COURSE  Pertinent labs & imaging results that were available during my care of the patient were reviewed by me and considered in my medical decision making (see chart for details).  DDX: hyperemesis, uti, dehydration, cholelithiasis, electrolyte abn  Dominique Shea is a 26 y.o. who presents to the ED with trackable nausea and vomiting in early pregnancy.  Patient is well-appearing in no acute distress.  Does have minor ketones on her urine for which she received IV fluids and IV hydration for her dehydration.  Not clinically consistent with appendicitis, UTI cholelithiasis or cholecystitis.  Patient tolerating oral hydration and stable for follow-up with PCP.      ____________________________________________   FINAL CLINICAL IMPRESSION(S) / ED DIAGNOSES  Final diagnoses:  Nausea and vomiting in pregnancy prior to [redacted] weeks gestation      NEW MEDICATIONS STARTED DURING THIS VISIT:  This SmartLink is deprecated. Use AVSMEDLIST instead to display the medication list for a  patient.   Note:  This document was prepared using Dragon voice recognition software and may include unintentional dictation errors.    Willy Eddyobinson, Constance Whittle, MD 08/22/17 1134

## 2017-08-22 NOTE — ED Triage Notes (Signed)
Patient presents with N/V in pregnancy. Reports being [redacted] weeks pregnant. Patient states she was just [facility] with [facility] prescription. Has not filled medicine

## 2017-08-23 LAB — RPR+RH+ABO+RUB AB+AB SCR+CB...
ANTIBODY SCREEN: NEGATIVE
HIV Screen 4th Generation wRfx: NONREACTIVE
Hematocrit: 41.6 % (ref 34.0–46.6)
Hemoglobin: 13.4 g/dL (ref 11.1–15.9)
Hepatitis B Surface Ag: NEGATIVE
MCH: 30.7 pg (ref 26.6–33.0)
MCHC: 32.2 g/dL (ref 31.5–35.7)
MCV: 95 fL (ref 79–97)
PLATELETS: 96 10*3/uL — AB (ref 150–379)
RBC: 4.37 x10E6/uL (ref 3.77–5.28)
RDW: 13 % (ref 12.3–15.4)
RH TYPE: POSITIVE
RPR Ser Ql: NONREACTIVE
Rubella Antibodies, IGG: 2.16 index (ref >0.99)
Varicella zoster IgG: 652 index (ref >165)
WBC: 9.1 10*3/uL (ref 3.4–10.8)

## 2017-08-23 LAB — HEMOGLOBINOPATHY EVALUATION
HEMOGLOBIN A2 QUANTITATION: 0.5 % — AB (ref 1.8–3.2)
HEMOGLOBIN F QUANTITATION: 0 % (ref 0.0–2.0)
HGB A: 97.4 % (ref 96.4–98.8)
HGB C: 0 %
HGB S: 0 %
HGB VARIANT: 2.1 % — ABNORMAL HIGH

## 2017-08-24 ENCOUNTER — Ambulatory Visit (INDEPENDENT_AMBULATORY_CARE_PROVIDER_SITE_OTHER): Payer: Self-pay

## 2017-08-24 ENCOUNTER — Ambulatory Visit (INDEPENDENT_AMBULATORY_CARE_PROVIDER_SITE_OTHER): Payer: Medicaid Other | Admitting: Maternal Newborn

## 2017-08-24 ENCOUNTER — Encounter: Payer: Self-pay | Admitting: Maternal Newborn

## 2017-08-24 VITALS — BP 120/80 | Wt 140.0 lb

## 2017-08-24 DIAGNOSIS — Z3480 Encounter for supervision of other normal pregnancy, unspecified trimester: Secondary | ICD-10-CM

## 2017-08-24 DIAGNOSIS — O98211 Gonorrhea complicating pregnancy, first trimester: Secondary | ICD-10-CM

## 2017-08-24 DIAGNOSIS — O210 Mild hyperemesis gravidarum: Secondary | ICD-10-CM

## 2017-08-24 DIAGNOSIS — D696 Thrombocytopenia, unspecified: Secondary | ICD-10-CM

## 2017-08-24 DIAGNOSIS — Z3A08 8 weeks gestation of pregnancy: Secondary | ICD-10-CM

## 2017-08-24 DIAGNOSIS — O219 Vomiting of pregnancy, unspecified: Secondary | ICD-10-CM | POA: Insufficient documentation

## 2017-08-24 MED ORDER — PROMETHAZINE HCL 25 MG PO TABS: 25.0000 mg | ORAL_TABLET | Freq: Four times a day (QID) | ORAL | 2 refills | Status: DC | PRN

## 2017-08-24 MED ORDER — CEFTRIAXONE SODIUM 250 MG IJ SOLR: 250.0000 mg | Freq: Once | INTRAMUSCULAR | Status: DC

## 2017-08-24 MED ORDER — CEFTRIAXONE SODIUM 250 MG IJ SOLR
250.0000 mg | Freq: Once | INTRAMUSCULAR | Status: AC
Start: 2017-08-24 — End: 2017-08-24
  Administered 2017-08-24: 250 mg via INTRAMUSCULAR

## 2017-08-24 MED ORDER — AZITHROMYCIN 500 MG PO TABS: 1000.0000 mg | ORAL_TABLET | Freq: Once | ORAL | 0 refills | Status: AC

## 2017-08-24 NOTE — Patient Instructions (Signed)
Gonorrhea °Gonorrhea is a sexually transmitted disease (STD) that can affect both men and women. If left untreated, this infection can: °· Damage the female or female organs. °· Cause women and men to be unable to have children (be sterile). °· Harm a fetus if an infected woman is pregnant. ° °It is important to get treatment for gonorrhea as soon as possible. It is also necessary for all of your sexual partners to be tested for the infection. °What are the causes? °This condition is caused by bacteria called Neisseria gonorrhoeae. The infection is spread from person to person through sexual contact, including oral, anal, and vaginal sex. A newborn can contract the infection from his or her mother during birth. °What increases the risk? °The following factors may make you more likely to develop this condition: °· Being a woman who is younger than 25 years of age and who is sexually active. °· Being a woman 25 years of age or older who has: °? A new sex partner. °? More than one sex partner. °? A sex partner who has an STD. °· Being a man who has: °? A new sex partner. °? More than one sex partner. °? A sex partner who has an STD. °· Using condoms inconsistently. °· Currently having, or having previously had, an STD. °· Exchanging sex for money or drugs. ° °What are the signs or symptoms? °Some people do not have any symptoms. If you do have symptoms, they may be different for females and males. °For females °· Pain in the lower abdomen. °· Abnormal vaginal discharge. The discharge may be cloudy, thick, or yellow-green in color. °· Bleeding between periods. °· Painful sex. °· Burning or itching in and around the vagina. °· Pain or burning when urinating. °· Irritation, pain, bleeding, or discharge from the rectum. This may occur if the infection was spread by anal sex. °· Sore throat or swollen lymph nodes in the neck. This may occur if the infection was spread by oral sex. °For males °· Abnormal discharge from the  penis. This discharge may be cloudy, thick, or yellow-green in color. °· Pain or burning during urination. °· Pain or swelling in the testicles. °· Irritation, pain, bleeding, or discharge from the rectum. This may occur if the infection was spread by anal sex. °· Sore throat, fever, or swollen lymph nodes in the neck. This may occur if the infection was spread by oral sex. °How is this diagnosed? °This condition is diagnosed based on: °· A physical exam. °· A sample of discharge that is examined under a microscope to look for the bacteria. The discharge may be taken from the urethra, cervix, throat, or rectum. °· Urine tests. ° °Not all of test results will be available during your visit. °How is this treated? °This condition is treated with antibiotic medicines. It is important for treatment to begin as soon as possible. Early treatment may prevent some problems from developing. Do not have sex during treatment. Avoid all types of sexual activity for 7 days after treatment is complete and until any sex partners have been treated. °Follow these instructions at home: °· Take over-the-counter and prescription medicines only as told by your health care provider. °· Take your antibiotic medicine as told by your health care provider. Do not stop taking the antibiotic even if you start to feel better. °· Do not have sex until at least 7 days after you and your partner(s) have finished treatment and your health care provider says   it is okay. °· It is your responsibility to get your test results. Ask your health care provider, or the department performing the test, when your results will be ready. °· If you test positive for gonorrhea, inform your recent sexual partners. This includes any oral, anal, or vaginal sex partners. They need to be checked for gonorrhea even if they do not have symptoms. They may need treatment, even if they test negative for gonorrhea. °· Keep all follow-up visits as told by your health care  provider. This is important. °How is this prevented? °· Use latex condoms correctly every time you have sexual intercourse. °· Ask if your sexual partner has been tested for STDs and had negative results. °· Avoid having multiple sexual partners. °Contact a health care provider if: °· You develop a bad reaction to the medicine you were prescribed. This may include: °? A rash. °? Nausea. °? Vomiting. °? Diarrhea. °· Your symptoms do not get better after a few days of taking antibiotics. °· Your symptoms get worse. °· You develop new symptoms. °· Your pain gets worse. °· You have a fever. °· You develop pain, itching, or discharge around the eyes. °Get help right away if: °· You feel dizzy or faint. °· You have trouble breathing or have shortness of breath. °· You develop an irregular heartbeat. °· You have severe abdominal pain with or without shoulder pain. °· You develop any bumps or sores (lesions) on your skin. °· You develop warmth, redness, pain, or swelling around your joints, such as the knee. °Summary °· Gonorrhea is an STDthat can affect both men and women. °· This condition is caused by bacteria called Neisseria gonorrhoeae. The infection is spread from person to person, usually through sexual contact, including oral, anal, and vaginal sex. °· Symptoms vary between males and females. Generally, they include abnormal discharge and burning during urination. Women may also experience painful sex, itching around the vagina, and bleeding between menstrual periods. Men may also experience swelling of the testicles. °· This condition is treated with antibiotic medicines. Do not have sex until at least 7 days after completing antibiotic treatment. °· If left untreated, gonorrhea can have serious side effects and complications. °This information is not intended to replace advice given to you by your health care provider. Make sure you discuss any questions you have with your health care provider. °Document  Released: 09/10/2000 Document Revised: 08/13/2016 Document Reviewed: 08/13/2016 °Elsevier Interactive Patient Education © 2017 [employer] ° °

## 2017-08-24 NOTE — Progress Notes (Signed)
Pt had positive gonorrhea culture, was treated today c rocephin 250mg  IM right glut.

## 2017-08-24 NOTE — Progress Notes (Signed)
C/o doesn't feel any better.rj

## 2017-08-24 NOTE — Progress Notes (Signed)
Routine Prenatal Care Visit  Subjective  Dominique Shea is a 26 y.o. [phone] at 6641w0d being seen today for ongoing prenatal care.  She is currently monitored for the following issues for this low-risk pregnancy and has Thrombocytopenia (HCC); Supervision of other normal pregnancy, antepartum; Gonorrhea affecting pregnancy in first trimester; Nausea and vomiting during pregnancy prior to [redacted] weeks gestation; and Hyperemesis gravidarum on their problem list.  ----------------------------------------------------------------------------------- Patient reports nausea and vomiting.  She has emesis multiple times a day and cannot keep solids or liquids down for any substantial amount of time. She has been seen twice in the hospital for IV hydration. Diclegis, ondansetron and Reglan have not helped control her symptoms.  Vag. Bleeding: None. Denies leaking of fluid.  ----------------------------------------------------------------------------------- The following portions of the patient's history were reviewed and updated as appropriate: allergies, current medications, past family history, past medical history, past social history, past surgical history and problem list. Problem list updated.   Objective  Last menstrual period 06/29/2017. Pregravid weight 140 lb (63.5 kg) Total Weight Gain 0 lb (0 kg) Urinalysis:      Fetal Status: Fetal Heart Rate (bpm): Present         General:  Alert, oriented and cooperative. Patient is in no acute distress.  Skin: Skin is warm and dry. No rash noted.   Cardiovascular: Normal heart rate noted  Respiratory: Normal respiratory effort, no problems with respiration noted  Abdomen: Soft, gravid, appropriate for gestational age. Pain/Pressure: Absent     Pelvic:  Cervical exam deferred        Extremities: Normal range of motion.  Edema: None  Mental Status: Normal mood and affect. Normal behavior. Normal judgment and thought content.     Assessment   26  y.o. [id number] at 2741w0d, EDD 04/05/2018 by Last Menstrual Period presenting for routine prenatal visit.  Plan   fourth Problems (from 08/16/17 to present)    Problem Noted Resolved   Supervision of other normal pregnancy, antepartum 08/16/2017 by Oswaldo ConroySchmid, Jacelyn Y, CNM No   Overview Signed 08/16/2017  1:39 PM by Oswaldo ConroySchmid, Jacelyn Y, CNM    Clinic [facility] Prenatal Labs  Dating  Blood type:     Genetic Screen 1 Screen:    AFP:     Quad:     NIPS: Antibody:   Anatomic US  Rubella:   Varicella:    GTT Early:               Third trimester:  RPR:     Rhogam  HBsAg:     TDaP vaccine                       Flu Shot: HIV:     Baby Food                                GBS:   Contraception  Pap:  CBB     CS/VBAC    Support Person               Received Rocephin today for gonorrhea and Rx sent to pharmacy for azithromycin. Recommended taking antiemetic before dose. Advised partner treatment.  Prescribed Phenergan to see if it will help relieve her emesis.  Ultrasound today shows size less than dates (7w 1d), FHR 148.    Preterm labor symptoms and general obstetric precautions including but not limited to vaginal bleeding, contractions, leaking of  fluid and fetal movement were reviewed in detail with the patient.  Please refer to After Visit Summary for other counseling recommendations.   Return in about 4 weeks (around 09/21/2017) for ROB/NT scan and first trimester screen.  Marcelyn BruinsJacelyn Schmid, CNM 08/24/2017  1:36 PM

## 2017-09-07 ENCOUNTER — Emergency Department (HOSPITAL_COMMUNITY)
Admission: EM | Admit: 2017-09-07 | Discharge: 2017-09-07 | Disposition: A | Discharge status: Home / Self Care | Payer: Medicaid Other | Attending: Emergency Medicine | Admitting: Emergency Medicine

## 2017-09-07 ENCOUNTER — Observation Stay
Admission: AD | Admit: 2017-09-07 | Discharge: 2017-09-09 | Disposition: A | Discharge status: Home / Self Care | Payer: Medicaid Other | Source: Ambulatory Visit | Attending: Obstetrics and Gynecology | Admitting: Obstetrics and Gynecology

## 2017-09-07 ENCOUNTER — Other Ambulatory Visit: Payer: Self-pay

## 2017-09-07 ENCOUNTER — Encounter (HOSPITAL_COMMUNITY): Payer: Self-pay

## 2017-09-07 ENCOUNTER — Telehealth: Payer: Self-pay

## 2017-09-07 DIAGNOSIS — R109 Unspecified abdominal pain: Secondary | ICD-10-CM | POA: Insufficient documentation

## 2017-09-07 DIAGNOSIS — O99321 Drug use complicating pregnancy, first trimester: Secondary | ICD-10-CM | POA: Diagnosis not present

## 2017-09-07 DIAGNOSIS — F17210 Nicotine dependence, cigarettes, uncomplicated: Secondary | ICD-10-CM | POA: Insufficient documentation

## 2017-09-07 DIAGNOSIS — Z8249 Family history of ischemic heart disease and other diseases of the circulatory system: Secondary | ICD-10-CM | POA: Diagnosis not present

## 2017-09-07 DIAGNOSIS — Z91041 Radiographic dye allergy status: Secondary | ICD-10-CM | POA: Diagnosis not present

## 2017-09-07 DIAGNOSIS — Z79899 Other long term (current) drug therapy: Secondary | ICD-10-CM | POA: Diagnosis not present

## 2017-09-07 DIAGNOSIS — Z9049 Acquired absence of other specified parts of digestive tract: Secondary | ICD-10-CM | POA: Insufficient documentation

## 2017-09-07 DIAGNOSIS — O210 Mild hyperemesis gravidarum: Principal | ICD-10-CM | POA: Diagnosis present

## 2017-09-07 DIAGNOSIS — O99341 Other mental disorders complicating pregnancy, first trimester: Secondary | ICD-10-CM | POA: Insufficient documentation

## 2017-09-07 DIAGNOSIS — Z820 Family history of epilepsy and other diseases of the nervous system: Secondary | ICD-10-CM | POA: Diagnosis not present

## 2017-09-07 DIAGNOSIS — Z91018 Allergy to other foods: Secondary | ICD-10-CM | POA: Diagnosis not present

## 2017-09-07 DIAGNOSIS — Z3A09 9 weeks gestation of pregnancy: Secondary | ICD-10-CM | POA: Diagnosis not present

## 2017-09-07 DIAGNOSIS — O99331 Smoking (tobacco) complicating pregnancy, first trimester: Secondary | ICD-10-CM | POA: Insufficient documentation

## 2017-09-07 DIAGNOSIS — F4322 Adjustment disorder with anxiety: Secondary | ICD-10-CM | POA: Diagnosis not present

## 2017-09-07 DIAGNOSIS — O26891 Other specified pregnancy related conditions, first trimester: Secondary | ICD-10-CM | POA: Insufficient documentation

## 2017-09-07 DIAGNOSIS — O99111 Other diseases of the blood and blood-forming organs and certain disorders involving the immune mechanism complicating pregnancy, first trimester: Secondary | ICD-10-CM | POA: Insufficient documentation

## 2017-09-07 DIAGNOSIS — Z87892 Personal history of anaphylaxis: Secondary | ICD-10-CM | POA: Diagnosis not present

## 2017-09-07 DIAGNOSIS — Z881 Allergy status to other antibiotic agents status: Secondary | ICD-10-CM | POA: Insufficient documentation

## 2017-09-07 DIAGNOSIS — F1290 Cannabis use, unspecified, uncomplicated: Secondary | ICD-10-CM | POA: Diagnosis not present

## 2017-09-07 DIAGNOSIS — D696 Thrombocytopenia, unspecified: Secondary | ICD-10-CM | POA: Diagnosis not present

## 2017-09-07 DIAGNOSIS — Z888 Allergy status to other drugs, medicaments and biological substances status: Secondary | ICD-10-CM | POA: Insufficient documentation

## 2017-09-07 DIAGNOSIS — Z9889 Other specified postprocedural states: Secondary | ICD-10-CM | POA: Insufficient documentation

## 2017-09-07 DIAGNOSIS — O9989 Other specified diseases and conditions complicating pregnancy, childbirth and the puerperium: Secondary | ICD-10-CM | POA: Insufficient documentation

## 2017-09-07 LAB — COMPREHENSIVE METABOLIC PANEL
ALK PHOS: 36 U/L — AB (ref 38–126)
ALT: 12 U/L — AB (ref 14–54)
AST: 17 U/L (ref 15–41)
Albumin: 4.3 g/dL (ref 3.5–5.0)
Anion gap: 10 (ref 5–15)
BILIRUBIN TOTAL: 1.3 mg/dL — AB (ref 0.3–1.2)
BUN: 18 mg/dL (ref 6–20)
CHLORIDE: 104 mmol/L (ref 101–111)
CO2: 18 mmol/L — AB (ref 22–32)
CREATININE: 0.57 mg/dL (ref 0.44–1.00)
Calcium: 9.7 mg/dL (ref 8.9–10.3)
GFR calc non Af Amer: >60 mL/min (ref >60)
GLUCOSE: 105 mg/dL — AB (ref 65–99)
Potassium: 3.4 mmol/L — ABNORMAL LOW (ref 3.5–5.1)
Sodium: 132 mmol/L — ABNORMAL LOW (ref 135–145)
Total Protein: 7.5 g/dL (ref 6.5–8.1)

## 2017-09-07 MED ORDER — METOCLOPRAMIDE HCL 10 MG PO TABS
5.0000 mg | ORAL_TABLET | Freq: Three times a day (TID) | ORAL | Status: DC
  Administered 2017-09-08 – 2017-09-09 (×7): 5 mg via ORAL
  Filled 2017-09-07 (×7): qty 1

## 2017-09-07 MED ORDER — POTASSIUM CHLORIDE 10 MEQ/100ML IV SOLN
10.0000 meq | INTRAVENOUS | Status: AC
  Administered 2017-09-07 – 2017-09-08 (×3): 10 meq via INTRAVENOUS
  Filled 2017-09-07 (×3): qty 100

## 2017-09-07 MED ORDER — M.V.I. ADULT IV INJ
INTRAVENOUS | Status: DC
  Filled 2017-09-07: qty 10

## 2017-09-07 MED ORDER — M.V.I. ADULT IV INJ
INTRAVENOUS | Status: DC
  Administered 2017-09-08 – 2017-09-09 (×2): via INTRAVENOUS
  Filled 2017-09-07 (×3): qty 1000

## 2017-09-07 MED ORDER — LACTATED RINGERS IV SOLN
INTRAVENOUS | Status: DC
  Administered 2017-09-07 – 2017-09-09 (×6): via INTRAVENOUS

## 2017-09-07 MED ORDER — ACETAMINOPHEN 325 MG PO TABS
650.0000 mg | ORAL_TABLET | ORAL | Status: DC | PRN
  Administered 2017-09-08 (×2): 650 mg via ORAL
  Filled 2017-09-07 (×3): qty 2

## 2017-09-07 MED ORDER — ONDANSETRON HCL 4 MG/2ML IJ SOLN
4.0000 mg | Freq: Four times a day (QID) | INTRAMUSCULAR | Status: DC | PRN
  Administered 2017-09-07 – 2017-09-08 (×2): 4 mg via INTRAVENOUS
  Filled 2017-09-07 (×2): qty 2

## 2017-09-07 MED ORDER — PROMETHAZINE HCL 25 MG RE SUPP: 25.0000 mg | Freq: Four times a day (QID) | RECTAL | Status: DC | PRN

## 2017-09-07 NOTE — ED Notes (Addendum)
Ems called back and  reports they called [employer] after running the call on pt and made a report.    EMS called c com and had them replay the tape because they were told that by the RN at pt's obgyn office that the pt was suicidal.    Pt denies SI with EMS and hospital  staff.  Pt not in waiting room.

## 2017-09-07 NOTE — H&P (Signed)
Obstetric H&P   Chief Complaint: Excessive nausea and vomiting  Prenatal Care Provider: [facility]  History of Present Illness: 26 y.o. [id number] 763w1d by 04/11/2018, Alternate EDD Entry called telephone triage and presented to outside ED today without being seen, then was admitted directly with complaints of excessive nausea and vomiting since Sunday that she describes as severe. She also has abdominal pain that "feels like hunger pains." She has not been able to keep down food or liquids. She has been in bed most of the time since Sunday and has felt weak, so much so that she has not been getting up to vomit, but has been leaning over the bedside when she has emesis. She denies vaginal bleeding and cramping. She has lost ten pounds since the beginning of her pregnancy. She has tried Unisom and B6, Zofran, and Phenergan without relief. She has tried dietary and environmental modifications with no effects. She admits to using marijuana in an attempt to control symptoms, but has recently stopped because she felt that it makes her more hungry but she cannot eat.   The patient is anxious because her hyperemesis makes it hard for her to care for her children and impossible to work. She has been having some disturbing dreams where she or the baby "don't make it." She is very worried about returning home and having the cycle of emesis repeat.  Pregravid weight 140 lb (63.5 kg) Total Weight Gain 0 lb (0 kg)  fourth Problems (from 08/16/17 to present)    Problem Noted Resolved   Supervision of other normal pregnancy, antepartum 08/16/2017 by Oswaldo ConroySchmid, Barba Solt Y, CNM No   Overview Signed 08/16/2017  1:39 PM by Oswaldo ConroySchmid, Earvin Blazier Y, CNM    Clinic [facility] Prenatal Labs  Dating  Blood type:     Genetic Screen 1 Screen:    AFP:     Quad:     NIPS: Antibody:   Anatomic US  Rubella:   Varicella:    GTT Early:               Third trimester:  RPR:     Rhogam  HBsAg:     TDaP vaccine                       Flu Shot: HIV:      Baby Food                                GBS:   Contraception  Pap:  CBB     CS/VBAC    Support Person                  Review of Systems: 10 point review of systems negative unless otherwise noted in HPI  Past Medical History: Past Medical History:  Diagnosis Date  . 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 History of gonorrhea 2014  . Thrombocytopenia (HCC)    DURING FIRST PREG    Past Surgical History: Past Surgical History:  Procedure Laterality Date  . CHOLECYSTECTOMY  2012   [facility]  . FRACTURE SURGERY      Past Obstetric History:  Past Gynecologic History:  Family History: Family History  Problem Relation Age of Onset  . Hypertension Mother   . Migraines Mother   . Hypertension Father   . Migraines Father   . Migraines Sister   . Migraines Brother  Social History: Social History   Socioeconomic History  . Marital status: Single    Spouse name: Not on file  . Number of children: 2  . Years of education: GED  . Highest education level: Not on file  Social Needs  . Financial resource strain: Not on file  . Food insecurity - worry: Not on file  . Food insecurity - inability: Not on file  . Transportation needs - medical: Not on file  . Transportation needs - non-medical: Not on file  Occupational History  . Not on file  Tobacco Use  . Smoking status: Current Every Day Smoker    Packs/day: 1.00    Years: 4.00    Pack years: 4.00    Types: Cigarettes  . Smokeless tobacco: Never Used  Substance and Sexual Activity  . Alcohol use: Yes    Comment: occ  . Drug use: Yes    Types: Marijuana  . Sexual activity: Yes    Birth control/protection: None  Other Topics Concern  . Not on file  Social History Narrative  . Not on file    Medications: Prior to Admission medications   Medication Sig Start Date End Date Taking? Authorizing Provider  Doxylamine-Pyridoxine (DICLEGIS) 10-10 MG TBEC Take 1 tablet by mouth 2 (two) times daily as  needed (For nausea.).    [provider]  metoCLOPramide (REGLAN) 10 MG tablet Take 1 tablet (10 mg total) by mouth every 8 (eight) hours as needed for nausea. 08/20/17   Judeth HornLawrence, Erin, NP  promethazine (PHENERGAN) 25 MG tablet Take 1 tablet (25 mg total) by mouth every 6 (six) hours as needed for nausea or vomiting. 08/24/17   Oswaldo ConroySchmid, Devanta Daniel Y, CNM    Allergies: Allergies  Allergen Reactions  . Peanut-Containing Drug Products Anaphylaxis  . Tomato Anaphylaxis  . Benadryl [Diphenhydramine]     Pt reports throat swelling  . Doxycycline     Pt had facial swelling after receiving ms04, zofran, hydrocodone, tylenol, ivp dye, and doxycline. Unclear which was responsible agent.   . Ibuprofen Swelling  . Ivp Dye [Iodinated Diagnostic Agents]     Pt had facial swelling after receiving ms04, zofran, hydrocodone, tylenol, ivp dye, and doxycline. Unclear which was responsible agent.     Physical Exam: Vitals: Blood pressure 129/86, pulse 81, temperature 98.5 F (36.9 C), temperature source Oral, resp. rate 18, height 5\' 4"  (1.626 m), weight 130 lb (59 kg), last menstrual period 06/29/2017, SpO2 98 .  General: tearful, distressed HEENT: normocephalic, anicteric Pulmonary: No increased work of breathing Cardiovascular: RRR Extremities: no edema, erythema, or tenderness Neurologic: Grossly intact Psychiatric: mood appropriate, affect full  Labs: Results for orders placed or performed during the hospital encounter of 09/07/17 (from the past 24 hour(s))  Comprehensive metabolic panel     Status: Abnormal   Collection Time: 09/07/17  3:49 PM  Result Value Ref Range   Sodium 132 (L) 135 - 145 mmol/L   Potassium 3.4 (L) 3.5 - 5.1 mmol/L   Chloride 104 101 - 111 mmol/L   CO2 18 (L) 22 - 32 mmol/L   Glucose, Bld 105 (H) 65 - 99 mg/dL   BUN 18 6 - 20 mg/dL   Creatinine, Ser 1.610.57 0.44 - 1.00 mg/dL   Calcium 9.7 8.9 - 09.610.3 mg/dL   Total Protein 7.5 6.5 - 8.1 g/dL   Albumin 4.3 3.5 -  5.0 g/dL   AST 17 15 - 41 U/L   ALT 12 (L) 14 - 54 U/L  Alkaline Phosphatase 36 (L) 38 - 126 U/L   Total Bilirubin 1.3 (H) 0.3 - 1.2 mg/dL   GFR calc non Af Amer >60 >60 mL/min   GFR calc Af Amer >60 >60 mL/min   Anion gap 10 5 - 15    Assessment: 26 y.o. [id number] [redacted]w[redacted]d, EDD 04/11/2018, Alternate EDD Entry presents with excessive nausea and vomiting in pregnancy.  Plan: 1) IV hydration, electrolyte repletion and vitamin replacement  2) Antiemetic regimen - Zofran, Phenergan suppositories, and Reglan. If effective, will have her continue at home.  3) PNL - Blood type --/--/A POS (11/24 1248) / Anti-bodyscreen Negative (11/20 1009) / Rubella 2.16 (11/20 1009) / Varicella immune / RPR Non Reactive (11/20 1009) / HBsAg Negative (11/20 1009) / HIV Non Reactive  4) Disposition - Observation overnight, repeat labs in AM and decide on plan for discharge.  Marcelyn Bruins, CNM 09/08/2017  1:26 AM

## 2017-09-07 NOTE — ED Triage Notes (Signed)
EMS reports pt is 9 weeks pregnancy.  Reports Due date April 11, 2018.  Pt reports n/v.  Pt says has been sick her whole pregnancy.  Reports this is her 4th pregnancy, she has 2 living children.  C/O abd pain.  Denies any vaginal bleeding but says has been having a "creamy" colored discharge.  Pt says abd pain is constant.

## 2017-09-07 NOTE — Telephone Encounter (Signed)
Pt called, speaking very fast and to the point,  Sounded like she wanted Dominique Shea" to call her back.  [phone]  Left detailed vm that she left msg of above on nurse line.  I see in Epic she is in ED at [facility].  I'm not sure what is going on with her but the ED is probably a good place for her to be.

## 2017-09-08 DIAGNOSIS — Z3A09 9 weeks gestation of pregnancy: Secondary | ICD-10-CM | POA: Diagnosis not present

## 2017-09-08 DIAGNOSIS — O210 Mild hyperemesis gravidarum: Secondary | ICD-10-CM | POA: Diagnosis not present

## 2017-09-08 DIAGNOSIS — O99111 Other diseases of the blood and blood-forming organs and certain disorders involving the immune mechanism complicating pregnancy, first trimester: Secondary | ICD-10-CM | POA: Diagnosis not present

## 2017-09-08 DIAGNOSIS — F4322 Adjustment disorder with anxiety: Secondary | ICD-10-CM

## 2017-09-08 LAB — URINALYSIS, COMPLETE (UACMP) WITH MICROSCOPIC
BACTERIA UA: NONE SEEN
BILIRUBIN URINE: NEGATIVE
Glucose, UA: NEGATIVE mg/dL
Hgb urine dipstick: NEGATIVE
KETONES UR: NEGATIVE mg/dL
LEUKOCYTES UA: NEGATIVE
NITRITE: NEGATIVE
PH: 7 (ref 5.0–8.0)
PROTEIN: NEGATIVE mg/dL
Specific Gravity, Urine: 1.002 — ABNORMAL LOW (ref 1.005–1.030)

## 2017-09-08 LAB — COMPREHENSIVE METABOLIC PANEL
ALBUMIN: 3.4 g/dL — AB (ref 3.5–5.0)
ALT: 10 U/L — ABNORMAL LOW (ref 14–54)
ANION GAP: 6 (ref 5–15)
AST: 16 U/L (ref 15–41)
Alkaline Phosphatase: 33 U/L — ABNORMAL LOW (ref 38–126)
BILIRUBIN TOTAL: 1.3 mg/dL — AB (ref 0.3–1.2)
BUN: 10 mg/dL (ref 6–20)
CO2: 21 mmol/L — AB (ref 22–32)
Calcium: 9.3 mg/dL (ref 8.9–10.3)
Chloride: 106 mmol/L (ref 101–111)
Creatinine, Ser: 0.59 mg/dL (ref 0.44–1.00)
GFR calc Af Amer: >60 mL/min (ref >60)
GFR calc non Af Amer: >60 mL/min (ref >60)
GLUCOSE: 95 mg/dL (ref 65–99)
POTASSIUM: 4 mmol/L (ref 3.5–5.1)
SODIUM: 133 mmol/L — AB (ref 135–145)
TOTAL PROTEIN: 5.9 g/dL — AB (ref 6.5–8.1)

## 2017-09-08 LAB — URINALYSIS, ROUTINE W REFLEX MICROSCOPIC
Bilirubin Urine: NEGATIVE
Glucose, UA: NEGATIVE mg/dL
Hgb urine dipstick: NEGATIVE
KETONES UR: 20 mg/dL — AB
LEUKOCYTES UA: NEGATIVE
NITRITE: NEGATIVE
PH: 6 (ref 5.0–8.0)
PROTEIN: NEGATIVE mg/dL
Specific Gravity, Urine: 1.015 (ref 1.005–1.030)

## 2017-09-08 LAB — MAGNESIUM: Magnesium: 1.5 mg/dL — ABNORMAL LOW (ref 1.7–2.4)

## 2017-09-08 MED ORDER — MAGNESIUM SULFATE 2 GM/50ML IV SOLN
2.0000 g | Freq: Once | INTRAVENOUS | Status: AC
  Administered 2017-09-08: 2 g via INTRAVENOUS
  Filled 2017-09-08: qty 50

## 2017-09-08 NOTE — Progress Notes (Signed)
Obstetric and Gynecology  Subjective  Feeling better since admission, one episode of emesis this morning.  Still some nausea, some limited but improved po tolerance to liquids  Objective   Vitals:   09/08/17 0724 09/08/17 1201  BP: 126/76 110/73  Pulse: 65 70  Resp:    Temp: 97.9 F (36.6 C) 98 F (36.7 C)  SpO2: 100% 100%     Intake/Output Summary (Last 24 hours) at 09/08/2017 1432 Last data filed at 09/08/2017 1404 Gross per 24 hour  Intake 600 ml  Output 2800 ml  Net -2200 ml    General: NAD Pulmonary: no increased work of breathing Abdomen: soft, non-tender Extremities: no edema  Labs: Results for orders placed or performed during the hospital encounter of 09/07/17 (from the past 24 hour(s))  Comprehensive metabolic panel     Status: Abnormal   Collection Time: 09/07/17  3:49 PM  Result Value Ref Range   Sodium 132 (L) 135 - 145 mmol/L   Potassium 3.4 (L) 3.5 - 5.1 mmol/L   Chloride 104 101 - 111 mmol/L   CO2 18 (L) 22 - 32 mmol/L   Glucose, Bld 105 (H) 65 - 99 mg/dL   BUN 18 6 - 20 mg/dL   Creatinine, Ser 7.820.57 0.44 - 1.00 mg/dL   Calcium 9.7 8.9 - 95.610.3 mg/dL   Total Protein 7.5 6.5 - 8.1 g/dL   Albumin 4.3 3.5 - 5.0 g/dL   AST 17 15 - 41 U/L   ALT 12 (L) 14 - 54 U/L   Alkaline Phosphatase 36 (L) 38 - 126 U/L   Total Bilirubin 1.3 (H) 0.3 - 1.2 mg/dL   GFR calc non Af Amer >60 >60 mL/min   GFR calc Af Amer >60 >60 mL/min   Anion gap 10 5 - 15  Urinalysis, Routine w reflex microscopic     Status: Abnormal   Collection Time: 09/08/17 12:58 AM  Result Value Ref Range   Color, Urine YELLOW (A) YELLOW   APPearance CLEAR (A) CLEAR   Specific Gravity, Urine 1.015 1.005 - 1.030   pH 6.0 5.0 - 8.0   Glucose, UA NEGATIVE NEGATIVE mg/dL   Hgb urine dipstick NEGATIVE NEGATIVE   Bilirubin Urine NEGATIVE NEGATIVE   Ketones, ur 20 (A) NEGATIVE mg/dL   Protein, ur NEGATIVE NEGATIVE mg/dL   Nitrite NEGATIVE NEGATIVE   Leukocytes, UA NEGATIVE NEGATIVE   Comprehensive metabolic panel     Status: Abnormal   Collection Time: 09/08/17  4:56 AM  Result Value Ref Range   Sodium 133 (L) 135 - 145 mmol/L   Potassium 4.0 3.5 - 5.1 mmol/L   Chloride 106 101 - 111 mmol/L   CO2 21 (L) 22 - 32 mmol/L   Glucose, Bld 95 65 - 99 mg/dL   BUN 10 6 - 20 mg/dL   Creatinine, Ser 2.130.59 0.44 - 1.00 mg/dL   Calcium 9.3 8.9 - 08.610.3 mg/dL   Total Protein 5.9 (L) 6.5 - 8.1 g/dL   Albumin 3.4 (L) 3.5 - 5.0 g/dL   AST 16 15 - 41 U/L   ALT 10 (L) 14 - 54 U/L   Alkaline Phosphatase 33 (L) 38 - 126 U/L   Total Bilirubin 1.3 (H) 0.3 - 1.2 mg/dL   GFR calc non Af Amer >60 >60 mL/min   GFR calc Af Amer >60 >60 mL/min   Anion gap 6 5 - 15  Magnesium     Status: Abnormal   Collection Time: 09/08/17  4:56 AM  Result Value Ref Range   Magnesium 1.5 (L) 1.7 - 2.4 mg/dL  Urinalysis, Complete w Microscopic     Status: Abnormal   Collection Time: 09/08/17 12:34 PM  Result Value Ref Range   Color, Urine COLORLESS (A) YELLOW   APPearance CLEAR (A) CLEAR   Specific Gravity, Urine 1.002 (L) 1.005 - 1.030   pH 7.0 5.0 - 8.0   Glucose, UA NEGATIVE NEGATIVE mg/dL   Hgb urine dipstick NEGATIVE NEGATIVE   Bilirubin Urine NEGATIVE NEGATIVE   Ketones, ur NEGATIVE NEGATIVE mg/dL   Protein, ur NEGATIVE NEGATIVE mg/dL   Nitrite NEGATIVE NEGATIVE   Leukocytes, UA NEGATIVE NEGATIVE   RBC / HPF 0-5 0 - 5 RBC/hpf   WBC, UA 0-5 0 - 5 WBC/hpf   Bacteria, UA NONE SEEN NONE SEEN   Squamous Epithelial / LPF 0-5 (A) NONE SEEN    Cultures: Results for orders placed or performed in visit on 08/16/17  Urine Culture     Status: None   Collection Time: 08/16/17  4:33 PM  Result Value Ref Range Status   Urine Culture, Routine Final report  Final   Organism ID, Bacteria Comment  Final    Comment: Mixed urogenital flora Less than 10,000 colonies/mL     Imaging:  Assessment   26 y.o. [id number] at 6067w2d with Hyperemesis gravidarum   Plan   1) Dysuria - UA ordered   2) FEN  -  continue MVI and IV fluids - advance diet as tolerated - Continue current antiemetic regimen and advance diet as tolerate with plan to transition to po once tolerating po consistently - Magnesium 2g IV once, repeat electrolytes tomorrow  3) Suicidal Ideation - Awaiting psychiatry consult for suicidal ideation at admission  4) DVT ppx - ambulatory  5) Disposition - pending improvement in po tolerance and psychiatry consult

## 2017-09-08 NOTE — Consult Note (Signed)
[facility]   Reason for Consult: Consult for 26 year old woman without a clear past psychiatric history.  Concern was raised that she had made a suicidal statement Referring Physician:  [facility] Patient Identification: Dominique Shea MRN:  [id number] Principal Diagnosis: Adjustment disorder with anxiety Diagnosis:   Patient Active Problem List   Diagnosis Date Noted  . Adjustment disorder with anxiety [F43.22] 09/08/2017  . Hyperemesis gravidarum, antepartum [O21.0] 09/07/2017  . Gonorrhea affecting pregnancy in first trimester [O98.211] 08/24/2017  . Nausea and vomiting during pregnancy prior to [redacted] weeks gestation [O21.9] 08/24/2017  . Hyperemesis gravidarum [O21.0] 08/24/2017  . Supervision of other normal pregnancy, antepartum [Z34.80] 08/16/2017  . Thrombocytopenia ([place]) [D69.6] 07/12/2011    Total Time spent with patient: 1 hour  Subjective:   Dominique Shea is a 26 y.o. female patient admitted with "I think she misunderstood what I was saying".  HPI: Patient interviewed chart reviewed.  Spoke with nursing as well.  I had actually come to see the patient last night but she was sound asleep and there did not seem to be a need to wake her.  On interview today the patient was wide awake and cooperative.  Patient states she is here in the hospital because of persistent nausea and vomiting with an inability to keep any food down with resultant loss of weight.  She is feeling weaker and weaker and is starting to feel panicky about her own health.  She is about [redacted] weeks pregnant says she has been having this persistent nausea and vomiting for as long as she has known about the pregnancy and if anything it is getting worse.  Concern was raised apparently that on the telephone she had made a comment that was interpreted as suicidal.  I was unable to find an exact description of what that comment was.  Judging from the notes it sounds like the patient was pretty anxious  at the time.  The patient tells me that she never made any sort of suicidal statement.  Tells me that when she spoke with the nurse or other care provider at [facility] that she was trying to explain that she was thinking that if she was not able to get control of her nausea and vomiting that she was likely to die from the pregnancy.  She tells me that she has been having frequent thoughts like this.  She also tells me that she has been having dreams now that she starved to death and died during the pregnancy.  Patient however denies having any kind of psychotic symptoms during the day.  She says she is feeling anxious but in control of her mood during the day but is starting to think that it would be better for her to have an abortion.  She says that she would rather to sacrifice this pregnancy then to potentially harm herself given that she already has 2 children at home.  Social history: Has 2 children ages 85 and 37 at home.  Lives with them and her boyfriend.  He works regularly and is frequently away from home.  Patient was working but is not able to work currently because of her condition.  Medical history: Patient is having hyperemesis during this pregnancy.  She has a past history of thrombocytopenia does not have significant ongoing medical problems otherwise.  Substance abuse history: Patient says that when not pregnant she had been using marijuana regularly.  She says that she stopped using marijuana when she found  out she was pregnant although she briefly tried to go back to using it to see if it would help with the nausea.  It did not help and so she is not currently using any.  Otherwise no substance abuse problems.  Past Psychiatric History: The patient denies any past psychiatric history.  Does not recall any kind of psychiatric treatment.  Denies history of suicide attempts or violence.  I did find a note from [facility] and some of the old computer records that suggested that there was a  psychiatric evaluation at some point but there was no actual detailed content to that note to allow me to know what the real concern was.  No evidence of hospitalization or dangerousness.  Risk to Self: Is patient at risk for suicide?: No Risk to Others:   Prior Inpatient Therapy:   Prior Outpatient Therapy:    Past Medical History:  Past Medical History:  Diagnosis Date  . 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 History of gonorrhea 2014  . Thrombocytopenia ([place])    DURING FIRST PREG    Past Surgical History:  Procedure Laterality Date  . CHOLECYSTECTOMY  2012   [facility]  . FRACTURE SURGERY     Family History:  Family History  Problem Relation Age of Onset  . Hypertension Mother   . Migraines Mother   . Hypertension Father   . Migraines Father   . Migraines Sister   . Migraines Brother    Family Psychiatric  History: Denies any known family history Social History:  Social History   Substance and Sexual Activity  Alcohol Use Yes   Comment: occ     Social History   Substance and Sexual Activity  Drug Use Yes  . Types: Marijuana    Social History   Socioeconomic History  . Marital status: Single    Spouse name: None  . Number of children: 2  . Years of education: GED  . Highest education level: None  Social Needs  . Financial resource strain: None  . Food insecurity - worry: None  . Food insecurity - inability: None  . Transportation needs - medical: None  . Transportation needs - non-medical: None  Occupational History  . None  Tobacco Use  . Smoking status: Current Every Day Smoker    Packs/day: 1.00    Years: 4.00    Pack years: 4.00    Types: Cigarettes  . Smokeless tobacco: Never Used  Substance and Sexual Activity  . Alcohol use: Yes    Comment: occ  . Drug use: Yes    Types: Marijuana  . Sexual activity: Yes    Birth control/protection: None  Other Topics Concern  . None  Social History Narrative  . None   Additional Social  History:    Allergies:   Allergies  Allergen Reactions  . Peanut-Containing Drug Products Anaphylaxis  . Tomato Anaphylaxis  . Benadryl [Diphenhydramine]     Pt reports throat swelling  . Doxycycline     Pt had facial swelling after receiving ms04, zofran, hydrocodone, tylenol, ivp dye, and doxycline. Unclear which was responsible agent.   . Ibuprofen Swelling  . Ivp Dye [Iodinated Diagnostic Agents]     Pt had facial swelling after receiving ms04, zofran, hydrocodone, tylenol, ivp dye, and doxycline. Unclear which was responsible agent.     Labs:  Results for orders placed or performed during the hospital encounter of 09/07/17 (from the past 48 hour(s))  Comprehensive metabolic  panel     Status: Abnormal   Collection Time: 09/07/17  3:49 PM  Result Value Ref Range   Sodium 132 (L) 135 - 145 mmol/L   Potassium 3.4 (L) 3.5 - 5.1 mmol/L   Chloride 104 101 - 111 mmol/L   CO2 18 (L) 22 - 32 mmol/L   Glucose, Bld 105 (H) 65 - 99 mg/dL   BUN 18 6 - 20 mg/dL   Creatinine, Ser 0.57 0.44 - 1.00 mg/dL   Calcium 9.7 8.9 - 10.3 mg/dL   Total Protein 7.5 6.5 - 8.1 g/dL   Albumin 4.3 3.5 - 5.0 g/dL   AST 17 15 - 41 U/L   ALT 12 (L) 14 - 54 U/L   Alkaline Phosphatase 36 (L) 38 - 126 U/L   Total Bilirubin 1.3 (H) 0.3 - 1.2 mg/dL   GFR calc non Af Amer >60 >60 mL/min   GFR calc Af Amer >60 >60 mL/min    Comment: (NOTE) The eGFR has been calculated using the CKD EPI equation. This calculation has not been validated in all clinical situations. eGFR's persistently <60 mL/min signify possible Chronic Kidney Disease.    Anion gap 10 5 - 15  Urinalysis, Routine w reflex microscopic     Status: Abnormal   Collection Time: 09/08/17 12:58 AM  Result Value Ref Range   Color, Urine YELLOW (A) YELLOW   APPearance CLEAR (A) CLEAR   Specific Gravity, Urine 1.015 1.005 - 1.030   pH 6.0 5.0 - 8.0   Glucose, UA NEGATIVE NEGATIVE mg/dL   Hgb urine dipstick NEGATIVE NEGATIVE   Bilirubin Urine  NEGATIVE NEGATIVE   Ketones, ur 20 (A) NEGATIVE mg/dL   Protein, ur NEGATIVE NEGATIVE mg/dL   Nitrite NEGATIVE NEGATIVE   Leukocytes, UA NEGATIVE NEGATIVE  Comprehensive metabolic panel     Status: Abnormal   Collection Time: 09/08/17  4:56 AM  Result Value Ref Range   Sodium 133 (L) 135 - 145 mmol/L   Potassium 4.0 3.5 - 5.1 mmol/L   Chloride 106 101 - 111 mmol/L   CO2 21 (L) 22 - 32 mmol/L   Glucose, Bld 95 65 - 99 mg/dL   BUN 10 6 - 20 mg/dL   Creatinine, Ser 0.59 0.44 - 1.00 mg/dL   Calcium 9.3 8.9 - 10.3 mg/dL   Total Protein 5.9 (L) 6.5 - 8.1 g/dL   Albumin 3.4 (L) 3.5 - 5.0 g/dL   AST 16 15 - 41 U/L   ALT 10 (L) 14 - 54 U/L   Alkaline Phosphatase 33 (L) 38 - 126 U/L   Total Bilirubin 1.3 (H) 0.3 - 1.2 mg/dL   GFR calc non Af Amer >60 >60 mL/min   GFR calc Af Amer >60 >60 mL/min    Comment: (NOTE) The eGFR has been calculated using the CKD EPI equation. This calculation has not been validated in all clinical situations. eGFR's persistently <60 mL/min signify possible Chronic Kidney Disease.    Anion gap 6 5 - 15  Magnesium     Status: Abnormal   Collection Time: 09/08/17  4:56 AM  Result Value Ref Range   Magnesium 1.5 (L) 1.7 - 2.4 mg/dL  Urinalysis, Complete w Microscopic     Status: Abnormal   Collection Time: 09/08/17 12:34 PM  Result Value Ref Range   Color, Urine COLORLESS (A) YELLOW   APPearance CLEAR (A) CLEAR   Specific Gravity, Urine 1.002 (L) 1.005 - 1.030   pH 7.0 5.0 - 8.0  Glucose, UA NEGATIVE NEGATIVE mg/dL   Hgb urine dipstick NEGATIVE NEGATIVE   Bilirubin Urine NEGATIVE NEGATIVE   Ketones, ur NEGATIVE NEGATIVE mg/dL   Protein, ur NEGATIVE NEGATIVE mg/dL   Nitrite NEGATIVE NEGATIVE   Leukocytes, UA NEGATIVE NEGATIVE   RBC / HPF 0-5 0 - 5 RBC/hpf   WBC, UA 0-5 0 - 5 WBC/hpf   Bacteria, UA NONE SEEN NONE SEEN   Squamous Epithelial / LPF 0-5 (A) NONE SEEN    Current Facility-Administered Medications  Medication Dose Route Frequency Provider  Last Rate Last Dose  . acetaminophen (TYLENOL) tablet 650 mg  650 mg Oral Q4H PRN Rexene Agent, CNM   650 mg at 09/08/17 1141  . dextrose 5% lactated ringers 1,000 mL with multivitamins adult (MVI -12) 10 mL infusion   Intravenous Continuous Rexene Agent, CNM 42 mL/hr at 09/08/17 0256    . lactated ringers infusion   Intravenous Continuous Rexene Agent, CNM 125 mL/hr at 09/08/17 1653    . metoCLOPramide (REGLAN) tablet 5 mg  5 mg Oral TID AC & HS Rexene Agent, CNM   5 mg at 09/08/17 1653  . ondansetron (ZOFRAN) injection 4 mg  4 mg Intravenous Q6H PRN Rexene Agent, CNM   4 mg at 09/08/17 0047  . promethazine (PHENERGAN) suppository 25 mg  25 mg Rectal Q6H PRN Rexene Agent, CNM        Musculoskeletal: Strength & Muscle Tone: within normal limits Gait & Station: normal Patient leans: N/A  Psychiatric Specialty Exam: Physical Exam  Nursing note and vitals reviewed. Constitutional: She appears well-developed and well-nourished.  HENT:  Head: Normocephalic and atraumatic.  Eyes: Conjunctivae are normal. Pupils are equal, round, and reactive to light.  Neck: Normal range of motion.  Cardiovascular: Regular rhythm and normal heart sounds.  Respiratory: Effort normal. No respiratory distress.  GI: Soft.  Musculoskeletal: Normal range of motion.  Neurological: She is alert.  Skin: Skin is warm and dry.  Psychiatric: Her speech is normal and behavior is normal. Judgment and thought content normal. Her mood appears anxious. Cognition and memory are normal.    Review of Systems  HENT: Negative.   Eyes: Negative.   Respiratory: Negative.   Cardiovascular: Negative.   Gastrointestinal: Positive for nausea and vomiting.  Musculoskeletal: Negative.   Skin: Negative.   Neurological: Positive for weakness.  Psychiatric/Behavioral: Negative for depression, hallucinations, memory loss, substance abuse and suicidal ideas. The patient is nervous/anxious and has  insomnia.     Blood pressure 120/78, pulse 65, temperature 97.7 F (36.5 C), temperature source Oral, resp. rate 16, height '5\' 4"'$  (1.626 m), weight 59 kg (130 lb), last menstrual period 06/29/2017, SpO2 100 %.Body mass index is 22.31 kg/m.  General Appearance: Fairly Groomed  Eye Contact:  Good  Speech:  Clear and Coherent  Volume:  Normal  Mood:  Anxious  Affect:  Congruent  Thought Process:  Goal Directed  Orientation:  Full (Time, Place, and Person)  Thought Content:  Logical  Suicidal Thoughts:  No  Homicidal Thoughts:  No  Memory:  Immediate;   Good Recent;   Good Remote;   Good  Judgement:  Good  Insight:  Good  Psychomotor Activity:  Normal  Concentration:  Concentration: Good  Recall:  Good  Fund of Knowledge:  Good  Language:  Good  Akathisia:  No  Handed:  Right  AIMS (if indicated):     Assets:  Desire for [place]  Support  ADL's:  Intact  Cognition:  WNL  Sleep:        Treatment Plan Summary: Plan This is a 26 year old woman who is having hyperemesis with a failure to gain any weight and in fact says that she has lost 10 pounds since being pregnant.  She is feeling anxious and panicky and is starting to have thoughts that maybe she will end up dying if she is not able to keep food down.  Patient absolutely denies any suicidal ideation.  There is no evidence of any psychotic thinking.  She is convincing that she is not suicidal at all.  I did some supportive counseling with the patient and encouraged her to try and make sure that the providers understood what her concerns were.  No need for any kind of psychiatric medicine or treatment or specific psychiatric follow-up.  Disposition: No evidence of imminent risk to self or others at present.   Patient does not meet criteria for psychiatric inpatient admission. Supportive therapy provided about ongoing stressors.  Alethia Berthold, MD 09/08/2017 6:06 PM

## 2017-09-08 NOTE — Clinical Social Work Note (Signed)
CSW consulted for trouble obtaining medications. RN CM will address this with patient. CSW has notified RN CM. York SpanielMonica Ezabella Teska MSW,LCSW [phone]

## 2017-09-09 DIAGNOSIS — O210 Mild hyperemesis gravidarum: Secondary | ICD-10-CM | POA: Diagnosis not present

## 2017-09-09 DIAGNOSIS — Z3A09 9 weeks gestation of pregnancy: Secondary | ICD-10-CM | POA: Diagnosis not present

## 2017-09-09 DIAGNOSIS — O99111 Other diseases of the blood and blood-forming organs and certain disorders involving the immune mechanism complicating pregnancy, first trimester: Secondary | ICD-10-CM | POA: Diagnosis not present

## 2017-09-09 LAB — COMPREHENSIVE METABOLIC PANEL
ALBUMIN: 3.2 g/dL — AB (ref 3.5–5.0)
ALT: 9 U/L — ABNORMAL LOW (ref 14–54)
AST: 16 U/L (ref 15–41)
Alkaline Phosphatase: 28 U/L — ABNORMAL LOW (ref 38–126)
Anion gap: 3 — ABNORMAL LOW (ref 5–15)
CHLORIDE: 111 mmol/L (ref 101–111)
CO2: 21 mmol/L — ABNORMAL LOW (ref 22–32)
Calcium: 8.9 mg/dL (ref 8.9–10.3)
Creatinine, Ser: 0.51 mg/dL (ref 0.44–1.00)
GFR calc Af Amer: >60 mL/min (ref >60)
GFR calc non Af Amer: >60 mL/min (ref >60)
GLUCOSE: 100 mg/dL — AB (ref 65–99)
POTASSIUM: 3.5 mmol/L (ref 3.5–5.1)
Sodium: 135 mmol/L (ref 135–145)
Total Bilirubin: 1.3 mg/dL — ABNORMAL HIGH (ref 0.3–1.2)
Total Protein: 5.8 g/dL — ABNORMAL LOW (ref 6.5–8.1)

## 2017-09-09 LAB — MAGNESIUM: Magnesium: 1.7 mg/dL (ref 1.7–2.4)

## 2017-09-09 MED ORDER — DOXYLAMINE-PYRIDOXINE 10-10 MG PO TBEC: 2.0000 | DELAYED_RELEASE_TABLET | Freq: Every day | ORAL | 0 refills | Status: DC

## 2017-09-09 MED ORDER — FAMOTIDINE 20 MG PO TABS: 20.0000 mg | ORAL_TABLET | Freq: Two times a day (BID) | ORAL | 1 refills | Status: DC

## 2017-09-09 MED ORDER — FAMOTIDINE 20 MG PO TABS
20.0000 mg | ORAL_TABLET | Freq: Two times a day (BID) | ORAL | Status: DC
  Administered 2017-09-09: 20 mg via ORAL
  Filled 2017-09-09: qty 1

## 2017-09-09 MED ORDER — METOCLOPRAMIDE HCL 10 MG PO TABS: ORAL_TABLET | ORAL | 1 refills | Status: DC

## 2017-09-09 NOTE — Progress Notes (Signed)
Patient understands all discharge instructions and recommended diet for hyperemesis along with the need to attend follow up appointments. Ensured patient understood the process to obtain medications per Care Management Patient discharge via wheelchair with auxillary.

## 2017-09-09 NOTE — Discharge Instructions (Signed)
Eating Plan for Hyperemesis Gravidarum °Hyperemesis gravidarum is a severe form of morning sickness. Because this condition causes severe nausea and vomiting, it can lead to dehydration, malnutrition, and weight loss. One way to lessen the symptoms of nausea and vomiting is to follow the eating plan for hyperemesis gravidarum. It is often used along with prescribed medicines to control your symptoms. °What can I do to relieve my symptoms? °Listen to your body. Everyone is different and has different preferences. Find what works best for you. Take any of the following actions that are helpful to you: °· Eat and drink slowly. °· Eat 5-6 small meals daily instead of 3 large meals. °· Eat crackers before you get out of bed in the morning. °· Try having a snack in the middle of the night. °· Starchy foods are usually tolerated well. Examples include cereal, toast, bread, potatoes, pasta, rice, and pretzels. °· Ginger may help with nausea. Add ¼ tsp ground ginger to hot tea or choose ginger tea. °· Try drinking 100% fruit juice or an electrolyte drink. An electrolyte drink contains sodium, potassium, and chloride. °· Continue to take your prenatal vitamins as told by your health care provider. If you are having trouble taking your prenatal vitamins, talk with your health care provider about different options. °· Include at least 1 serving of protein with your meals and snacks. Protein options include meats or poultry, beans, nuts, eggs, and yogurt. Try eating a protein-rich snack before bed. Examples of these snacks include cheese and crackers or half of a peanut butter or turkey sandwich. °· Consider eliminating foods that trigger your symptoms. These may include spicy foods, coffee, high-fat foods, very sweet foods, and acidic foods. °· Try meals that have more protein combined with bland, salty, lower-fat, and dry foods, such as nuts, seeds, pretzels, crackers, and cereal. °· Talk with your healthcare provider about  starting a supplement of vitamin B6. °· Have fluids that are cold, clear, and carbonated or sour. Examples include lemonade, ginger ale, lemon-lime soda, ice water, and sparkling water. °· Try lemon or mint tea. °· Try brushing your teeth or using a mouth rinse after meals. ° °What should I avoid to reduce my symptoms? °Avoiding some of the following things may help reduce your symptoms. °· Foods with strong smells. Try eating meals in well-ventilated areas that are free of odors. °· Drinking water or other beverages with meals. Try not to drink anything during the 30 minutes before and after your meals. °· Drinking more than 1 cup of fluid at a time. Sometimes using a straw helps. °· Fried or high-fat foods, such as butter and cream sauces. °· Spicy foods. °· Skipping meals as best as you can. Nausea can be more intense on an empty stomach. If you cannot tolerate food at that time, do not force it. Try sucking on ice chips or other frozen items, and make up for missed calories later. °· Lying down within 2 hours after eating. °· Environmental triggers. These may include smoky rooms, closed spaces, rooms with strong smells, warm or humid places, overly loud and noisy rooms, and rooms with motion or flickering lights. °· Quick and sudden changes in your movement. ° °This information is not intended to replace advice given to you by your health care provider. Make sure you discuss any questions you have with your health care provider. °Document Released: 07/11/2007 Document Revised: 05/12/2016 Document Reviewed: 04/13/2016 °[employer] Interactive Patient Education © 2018 [employer] ° °

## 2017-09-09 NOTE — Discharge Summary (Signed)
Physician Discharge Summary  Patient ID: Dominique Shea MRN: [id number] DOB/AGE: [birth year] 25 y.o.  Admit date: 09/07/2017 Discharge date: 09/09/2017  Admission Diagnoses: Hyperemesis gravidarum IUP at [facility]  Discharge Diagnoses:  Hyperemesis gravidarum Adjustment disorder   Discharged Condition: stable  Hospital Course: 26 y.o. [id number] at [facility] by [facility] 04/10/2018 based on a 38w5dultrasound, who was admitted 09/07/2017 with complaints of excessive nausea and vomiting since Sunday (09/04/2017) that she describes as severe. She also had abdominal pain that "felt like hunger pains." She had not been able to keep down food or liquids. She had been in bed most of the time since Sunday and  felt weak, so much so that she was not  getting up to vomit, but was leaning over the bedside when she had emesis. She denied vaginal bleeding and cramping. She  lost ten pounds since the beginning of her pregnancy. She tried Unisom and B6, Zofran, and Phenergan without relief. She also tried dietary and environmental modifications with no effects. She admitted to using marijuana in an attempt to control symptoms, but  recently stopped because she felt that it made her more hungry but she could not eat.   The patient is anxious because her hyperemesis makes it hard for her to care for her children and impossible to work. She has been having some disturbing dreams where she or the baby "don't make it."   Patient was given IV hydration and IV vitamins. Here potassium (3.4) and her magnesium (1.5) were repleted intravenously. She was also begun on Reglan 5 mg qid po and Zofran IV prn. Her diet was advanced from clear liquids to a [employer] Vomited once in the AM on 12/13 and 12/14, but has otherwise been able to keep down most of her oral intake. Has had an excellent urine output.  She was also seen by psychiatry to evaluate for suicidal ideation. The psychiatrist did not find that the patient was suicidal but  rather has an adjustment disorder in reaction to her hyperemesis.   [name] discharged on 09/09/2017 tolerating regular diet    Consults: psychiatry  Significant Diagnostic Studies:  Results for orders placed or performed during the hospital encounter of 09/07/17 (from the past 72 hour(s))  Comprehensive metabolic panel     Status: Abnormal   Collection Time: 09/07/17  3:49 PM  Result Value Ref Range   Sodium 132 (L) 135 - 145 mmol/L   Potassium 3.4 (L) 3.5 - 5.1 mmol/L   Chloride 104 101 - 111 mmol/L   CO2 18 (L) 22 - 32 mmol/L   Glucose, Bld 105 (H) 65 - 99 mg/dL   BUN 18 6 - 20 mg/dL   Creatinine, Ser 0.57 0.44 - 1.00 mg/dL   Calcium 9.7 8.9 - 10.3 mg/dL   Total Protein 7.5 6.5 - 8.1 g/dL   Albumin 4.3 3.5 - 5.0 g/dL   AST 17 15 - 41 U/L   ALT 12 (L) 14 - 54 U/L   Alkaline Phosphatase 36 (L) 38 - 126 U/L   Total Bilirubin 1.3 (H) 0.3 - 1.2 mg/dL   GFR calc non Af Amer >60 >60 mL/min   GFR calc Af Amer >60 >60 mL/min    Comment: (NOTE) The eGFR has been calculated using the CKD EPI equation. This calculation has not been validated in all clinical situations. eGFR's persistently <60 mL/min signify possible Chronic Kidney Disease.    Anion gap 10 5 - 15  Urinalysis, Routine w reflex microscopic  Status: Abnormal   Collection Time: 09/08/17 12:58 AM  Result Value Ref Range   Color, Urine YELLOW (A) YELLOW   APPearance CLEAR (A) CLEAR   Specific Gravity, Urine 1.015 1.005 - 1.030   pH 6.0 5.0 - 8.0   Glucose, UA NEGATIVE NEGATIVE mg/dL   Hgb urine dipstick NEGATIVE NEGATIVE   Bilirubin Urine NEGATIVE NEGATIVE   Ketones, ur 20 (A) NEGATIVE mg/dL   Protein, ur NEGATIVE NEGATIVE mg/dL   Nitrite NEGATIVE NEGATIVE   Leukocytes, UA NEGATIVE NEGATIVE  Comprehensive metabolic panel     Status: Abnormal   Collection Time: 09/08/17  4:56 AM  Result Value Ref Range   Sodium 133 (L) 135 - 145 mmol/L   Potassium 4.0 3.5 - 5.1 mmol/L   Chloride 106 101 - 111 mmol/L    CO2 21 (L) 22 - 32 mmol/L   Glucose, Bld 95 65 - 99 mg/dL   BUN 10 6 - 20 mg/dL   Creatinine, Ser 0.59 0.44 - 1.00 mg/dL   Calcium 9.3 8.9 - 10.3 mg/dL   Total Protein 5.9 (L) 6.5 - 8.1 g/dL   Albumin 3.4 (L) 3.5 - 5.0 g/dL   AST 16 15 - 41 U/L   ALT 10 (L) 14 - 54 U/L   Alkaline Phosphatase 33 (L) 38 - 126 U/L   Total Bilirubin 1.3 (H) 0.3 - 1.2 mg/dL   GFR calc non Af Amer >60 >60 mL/min   GFR calc Af Amer >60 >60 mL/min    Comment: (NOTE) The eGFR has been calculated using the CKD EPI equation. This calculation has not been validated in all clinical situations. eGFR's persistently <60 mL/min signify possible Chronic Kidney Disease.    Anion gap 6 5 - 15  Magnesium     Status: Abnormal   Collection Time: 09/08/17  4:56 AM  Result Value Ref Range   Magnesium 1.5 (L) 1.7 - 2.4 mg/dL  Urinalysis, Complete w Microscopic     Status: Abnormal   Collection Time: 09/08/17 12:34 PM  Result Value Ref Range   Color, Urine COLORLESS (A) YELLOW   APPearance CLEAR (A) CLEAR   Specific Gravity, Urine 1.002 (L) 1.005 - 1.030   pH 7.0 5.0 - 8.0   Glucose, UA NEGATIVE NEGATIVE mg/dL   Hgb urine dipstick NEGATIVE NEGATIVE   Bilirubin Urine NEGATIVE NEGATIVE   Ketones, ur NEGATIVE NEGATIVE mg/dL   Protein, ur NEGATIVE NEGATIVE mg/dL   Nitrite NEGATIVE NEGATIVE   Leukocytes, UA NEGATIVE NEGATIVE   RBC / HPF 0-5 0 - 5 RBC/hpf   WBC, UA 0-5 0 - 5 WBC/hpf   Bacteria, UA NONE SEEN NONE SEEN   Squamous Epithelial / LPF 0-5 (A) NONE SEEN  Comprehensive metabolic panel     Status: Abnormal   Collection Time: 09/09/17  4:20 AM  Result Value Ref Range   Sodium 135 135 - 145 mmol/L   Potassium 3.5 3.5 - 5.1 mmol/L   Chloride 111 101 - 111 mmol/L   CO2 21 (L) 22 - 32 mmol/L   Glucose, Bld 100 (H) 65 - 99 mg/dL   BUN <5 (L) 6 - 20 mg/dL   Creatinine, Ser 0.51 0.44 - 1.00 mg/dL   Calcium 8.9 8.9 - 10.3 mg/dL   Total Protein 5.8 (L) 6.5 - 8.1 g/dL   Albumin 3.2 (L) 3.5 - 5.0 g/dL   AST 16 15 -  41 U/L   ALT 9 (L) 14 - 54 U/L   Alkaline Phosphatase 28 (L) 38 -  126 U/L   Total Bilirubin 1.3 (H) 0.3 - 1.2 mg/dL   GFR calc non Af Amer >60 >60 mL/min   GFR calc Af Amer >60 >60 mL/min    Comment: (NOTE) The eGFR has been calculated using the CKD EPI equation. This calculation has not been validated in all clinical situations. eGFR's persistently <60 mL/min signify possible Chronic Kidney Disease.    Anion gap 3 (L) 5 - 15  Magnesium     Status: None   Collection Time: 09/09/17  4:20 AM  Result Value Ref Range   Magnesium 1.7 1.7 - 2.4 mg/dL    Discharge Exam: Blood pressure 123/79, pulse 62, temperature 98.2 F (36.8 C), temperature source Oral, resp. rate 18, height _0  (1.626 m), weight 59 kg (130 lb), last menstrual period 06/29/2017, SpO2 100 %.  General: AAF in NAD, tolerated some Frosted Flakes this AM and some fluids Abdomen: soft, generalized muscular tenderness Extremities: No evidence of DVT, no edema Psyche: appropriate mood Neuro: alert and oriented x3   Disposition: 01-Home or Self Care  Discharge Instructions    Discontinue IV   Complete by:  As directed      Allergies as of 09/09/2017      Reactions   Peanut-containing Drug Products Anaphylaxis   Tomato Anaphylaxis   Benadryl [diphenhydramine]    Pt reports throat swelling   Doxycycline    Pt had facial swelling after receiving ms04, zofran, hydrocodone, tylenol, ivp dye, and doxycline. Unclear which was responsible agent.    Ibuprofen Swelling   Ivp Dye [iodinated Diagnostic Agents]    Pt had facial swelling after receiving ms04, zofran, hydrocodone, tylenol, ivp dye, and doxycline. Unclear which was responsible agent.       Medication List    STOP taking these medications   promethazine 25 MG tablet Commonly known as:  PHENERGAN     TAKE these medications   Doxylamine-Pyridoxine 10-10 MG Tbec Commonly known as:  DICLEGIS Take 2 tablets by mouth at bedtime. What changed:    how much  to take  when to take this  reasons to take this   famotidine 20 MG tablet Commonly known as:  PEPCID Take 1 tablet (20 mg total) by mouth 2 (two) times daily.   metoCLOPramide 10 MG tablet Commonly known as:  REGLAN Take 0.5 tablet tid before each meal and at hs What changed:    how much to take  how to take this  when to take this  reasons to take this  additional instructions      [place] Go on 09/14/2017.   Why:  Please attend follow-up appointment Wednesday 12/19 at 8:50 Contact information: [address] [place] [address] [phone]           Signed: Dalia Heading 09/09/2017, 12:36 PM

## 2017-09-09 NOTE — Care Management Note (Addendum)
Case Management Note  Patient Details  Name: Dominique Shea MRN: [id number] Date of Birth: 10/16/1990  Subjective/Objective:                  Ad[facility] observation status with the diagnosis of nausea/vomiting nine weeks gestation. Two living children in the home.  States she did have maternity medical with the other pregnancies.  Going [facility]. States she has an address in [place] but may be living with her mother in [place].   Action/Plan: Medication Management application given. States she does know where this building is because she has family members that go there.  Discussed that if she decides to stay with her mother she could get her medications at this building.  If she decides to go back to [place] information about [facility] [place] given Also, states her mother may be able to help with medications.  Nurse Victorino DikeJennifer  updated   Expected Discharge Date:                  Expected Discharge Plan:     In-House Referral:   yes  Discharge planning Services     Post Acute Care Choice:    Choice offered to:     DME Arranged:    DME Agency:     HH Arranged:    HH Agency:     Status of Service:     If discussed at Long Length of Stay Meetings, dates discussed:    Additional Comments:  Gwenette GreetBrenda S Kimbley Sprague, RN MSN CCM Care Management [phone] 09/09/2017, 9:06 AM

## 2017-09-14 ENCOUNTER — Encounter: Payer: Self-pay | Admitting: Maternal Newborn

## 2017-09-14 ENCOUNTER — Ambulatory Visit (INDEPENDENT_AMBULATORY_CARE_PROVIDER_SITE_OTHER): Payer: Medicaid Other | Admitting: Maternal Newborn

## 2017-09-14 VITALS — BP 120/80 | Wt 135.0 lb

## 2017-09-14 DIAGNOSIS — O99891 Other specified diseases and conditions complicating pregnancy: Secondary | ICD-10-CM

## 2017-09-14 DIAGNOSIS — Z3480 Encounter for supervision of other normal pregnancy, unspecified trimester: Secondary | ICD-10-CM

## 2017-09-14 DIAGNOSIS — O9989 Other specified diseases and conditions complicating pregnancy, childbirth and the puerperium: Secondary | ICD-10-CM

## 2017-09-14 DIAGNOSIS — K117 Disturbances of salivary secretion: Secondary | ICD-10-CM | POA: Insufficient documentation

## 2017-09-14 DIAGNOSIS — O210 Mild hyperemesis gravidarum: Secondary | ICD-10-CM

## 2017-09-14 DIAGNOSIS — M549 Dorsalgia, unspecified: Secondary | ICD-10-CM

## 2017-09-14 MED ORDER — CYCLOBENZAPRINE HCL 10 MG PO TABS: 10.0000 mg | ORAL_TABLET | Freq: Three times a day (TID) | ORAL | 2 refills | Status: DC | PRN

## 2017-09-14 NOTE — Progress Notes (Signed)
Prenatal Care Follow Up Visit  Subjective  Dominique Shea is a 26 y.o. [phone] at 4358w2d being seen today for ongoing prenatal care.  She is currently monitored for the following issues for this high-risk pregnancy and has Thrombocytopenia (HCC); Supervision of other normal pregnancy, antepartum; Gonorrhea affecting pregnancy in first trimester; Nausea and vomiting during pregnancy prior to [redacted] weeks gestation; Hyperemesis gravidarum; Hyperemesis gravidarum, antepartum; Adjustment disorder with anxiety; and Ptyalism on their problem list.  ----------------------------------------------------------------------------------- Patient reports nausea and vomiting, frequency of emesis has reduced to a couple of times a day. Also reports excessive salivation and lower back pain. She is able to keep some foods and hydration down and has gained five pounds since her last visit.  Vag. Bleeding: None.   ----------------------------------------------------------------------------------- The following portions of the patient's history were reviewed and updated as appropriate: allergies, current medications, past family history, past medical history, past social history, past surgical history and problem list. Problem list updated.   Objective  Blood pressure 120/80, weight 135 lb (61.2 kg), last menstrual period 06/29/2017. Pregravid weight 140 lb (63.5 kg) Total Weight Gain  (-2.268 kg) Urinalysis: Urine Protein: Negative Urine Glucose: Negative  Negative Ketones.   General:  Alert, oriented and cooperative. Patient is in no acute distress.  Skin: Skin is warm and dry. No rash noted.   Cardiovascular: Normal heart rate noted  Respiratory: Normal respiratory effort, no problems with respiration noted  Abdomen: Soft, gravid, appropriate for gestational age. Pain/Pressure: Absent     Pelvic:  Cervical exam deferred        Extremities: Normal range of motion.  Edema: None  Mental Status: Normal mood and  affect. Normal behavior. Normal judgment and thought content.     Assessment   26 y.o. [id number] at 8258w2d, EDD 04/10/2018 by Ultrasound presenting for  prenatal visit.  Plan   fourth Problems (from 08/16/17 to present)    Problem Noted Resolved   Supervision of other normal pregnancy, antepartum 08/16/2017 by Oswaldo ConroySchmid, Khaleel Beckom Y, CNM No   Overview Signed 08/16/2017  1:39 PM by Oswaldo ConroySchmid, Bland Rudzinski Y, CNM    [facility]inic [facility] Prenatal Labs  Dating  Blood type:     Genetic Screen 1 Screen:    AFP:     Quad:     NIPS: Antibody:   Anatomic US  Rubella:   Varicella:    GTT Early:               Third trimester:  RPR:     Rhogam  HBsAg:     TDaP vaccine                       Flu Shot: HIV:     Baby Food                                GBS:   Contraception  Pap:  CBB     CS/VBAC    Support Person               Discussed further measures for improving nausea/vomiting including daily exercise in fresh air, using hard candy to mask bad taste in mouth from ptyalism, etc. Patient is feeling better overall but is still symptomatic, encouraged continuing to take anti-nausea medications daily. Rx for Flexeril for lower back pain.  Preterm labor symptoms and general obstetric precautions including but not limited to vaginal bleeding and contractions were  reviewed in detail with the patient.  Keep scheduled ROB on 09/22/17.  Marcelyn BruinsJacelyn Briella Hobday, CNM 09/14/2017  9:15 AM

## 2017-09-14 NOTE — Progress Notes (Signed)
C/o f/u hyperemesis;  Ketones neg. rj

## 2017-09-22 ENCOUNTER — Encounter: Payer: Medicaid Other | Admitting: Obstetrics and Gynecology

## 2017-09-22 ENCOUNTER — Ambulatory Visit (INDEPENDENT_AMBULATORY_CARE_PROVIDER_SITE_OTHER): Payer: Medicaid Other

## 2017-09-22 ENCOUNTER — Ambulatory Visit: Payer: Medicaid Other | Admitting: Obstetrics and Gynecology

## 2017-09-22 DIAGNOSIS — Z3480 Encounter for supervision of other normal pregnancy, unspecified trimester: Secondary | ICD-10-CM | POA: Diagnosis not present

## 2017-09-26 ENCOUNTER — Ambulatory Visit (INDEPENDENT_AMBULATORY_CARE_PROVIDER_SITE_OTHER): Payer: Medicaid Other | Admitting: Obstetrics and Gynecology

## 2017-09-26 VITALS — BP 112/66 | Wt 136.0 lb

## 2017-09-26 DIAGNOSIS — O99320 Drug use complicating pregnancy, unspecified trimester: Secondary | ICD-10-CM

## 2017-09-26 DIAGNOSIS — Z1379 Encounter for other screening for genetic and chromosomal anomalies: Secondary | ICD-10-CM

## 2017-09-26 DIAGNOSIS — O98211 Gonorrhea complicating pregnancy, first trimester: Secondary | ICD-10-CM

## 2017-09-26 DIAGNOSIS — Z3A12 12 weeks gestation of pregnancy: Secondary | ICD-10-CM

## 2017-09-26 NOTE — Progress Notes (Signed)
Nausea slowly improving, continue current antiemetic regimen.  NT unable to be obtained secondary to fetal position will retry in 1 week.  If unable to obtain at that time discussed 2nd trimester testing

## 2017-09-26 NOTE — Progress Notes (Signed)
ROB N&V 

## 2017-09-27 NOTE — L&D Delivery Note (Signed)
NICU to room prior to delivery   Delivery Note At 9:01 AM a viable female was delivered via Vaginal, Spontaneous (Presentation:LOA).  APGAR: 8/8; weight 2 lbs 0.8 oz.   Placenta status: spontaneous, intact via Tomasa BlaseSchultz. Cord clamped & cut immediately by maternal aunt. Neonate taken to infant warmer to the awaiting NICU team. Cord: 3VC with no complications. Cord pH: 7.319  Anesthesia: Lidocaine injected in periurethral skid mark Episiotomy: None Lacerations: Periurethral skid mark -- hemostatic no repair needed Suture Repair: N/A Est. Blood Loss (mL): 250  Mom to HROB Unit.  Baby to NICU.  Dominique Moraolitta Murel Shenberger, MSN, CNM 01/16/18, 9:31 AM

## 2017-09-28 LAB — GC/CHLAMYDIA PROBE AMP
CHLAMYDIA, DNA PROBE: NEGATIVE
Neisseria gonorrhoeae by PCR: NEGATIVE

## 2017-10-01 LAB — URINE DRUG PANEL 7
AMPHETAMINES, URINE: NEGATIVE ng/mL
BARBITURATE QUANT UR: NEGATIVE ng/mL
BENZODIAZEPINE QUANT UR: NEGATIVE ng/mL
Cannabinoid Quant, Ur: POSITIVE — AB
Cocaine (Metab.): NEGATIVE ng/mL
OPIATE QUANT UR: NEGATIVE ng/mL
PCP Quant, Ur: NEGATIVE ng/mL

## 2017-10-04 ENCOUNTER — Encounter: Payer: Medicaid Other | Admitting: Obstetrics & Gynecology

## 2017-10-04 ENCOUNTER — Other Ambulatory Visit: Payer: Medicaid Other

## 2017-10-06 ENCOUNTER — Ambulatory Visit (INDEPENDENT_AMBULATORY_CARE_PROVIDER_SITE_OTHER): Payer: Medicaid Other

## 2017-10-06 ENCOUNTER — Ambulatory Visit: Payer: Medicaid Other | Admitting: Obstetrics and Gynecology

## 2017-10-06 DIAGNOSIS — Z1379 Encounter for other screening for genetic and chromosomal anomalies: Secondary | ICD-10-CM | POA: Diagnosis not present

## 2017-10-18 ENCOUNTER — Emergency Department (HOSPITAL_COMMUNITY)
Admission: EM | Admit: 2017-10-18 | Discharge: 2017-10-18 | Disposition: A | Discharge status: Home / Self Care | Payer: Medicaid Other | Attending: Emergency Medicine | Admitting: Emergency Medicine

## 2017-10-18 ENCOUNTER — Encounter (HOSPITAL_COMMUNITY): Payer: Self-pay | Admitting: *Deleted

## 2017-10-18 ENCOUNTER — Inpatient Hospital Stay: Payer: Medicaid Other | Admitting: Oncology

## 2017-10-18 ENCOUNTER — Inpatient Hospital Stay: Payer: Medicaid Other

## 2017-10-18 ENCOUNTER — Emergency Department (HOSPITAL_COMMUNITY): Payer: Medicaid Other

## 2017-10-18 ENCOUNTER — Telehealth: Payer: Self-pay | Admitting: Oncology

## 2017-10-18 ENCOUNTER — Other Ambulatory Visit: Payer: Self-pay

## 2017-10-18 DIAGNOSIS — O9989 Other specified diseases and conditions complicating pregnancy, childbirth and the puerperium: Secondary | ICD-10-CM | POA: Insufficient documentation

## 2017-10-18 DIAGNOSIS — N760 Acute vaginitis: Secondary | ICD-10-CM

## 2017-10-18 DIAGNOSIS — R1012 Left upper quadrant pain: Secondary | ICD-10-CM | POA: Diagnosis present

## 2017-10-18 DIAGNOSIS — Z3A15 15 weeks gestation of pregnancy: Secondary | ICD-10-CM | POA: Diagnosis not present

## 2017-10-18 DIAGNOSIS — R1031 Right lower quadrant pain: Secondary | ICD-10-CM

## 2017-10-18 DIAGNOSIS — F17210 Nicotine dependence, cigarettes, uncomplicated: Secondary | ICD-10-CM | POA: Insufficient documentation

## 2017-10-18 DIAGNOSIS — O99332 Smoking (tobacco) complicating pregnancy, second trimester: Secondary | ICD-10-CM | POA: Insufficient documentation

## 2017-10-18 DIAGNOSIS — R102 Pelvic and perineal pain: Secondary | ICD-10-CM

## 2017-10-18 DIAGNOSIS — B9689 Other specified bacterial agents as the cause of diseases classified elsewhere: Secondary | ICD-10-CM | POA: Insufficient documentation

## 2017-10-18 DIAGNOSIS — R1032 Left lower quadrant pain: Secondary | ICD-10-CM | POA: Diagnosis not present

## 2017-10-18 DIAGNOSIS — O26892 Other specified pregnancy related conditions, second trimester: Secondary | ICD-10-CM

## 2017-10-18 DIAGNOSIS — O23599 Infection of other part of genital tract in pregnancy, unspecified trimester: Secondary | ICD-10-CM | POA: Diagnosis not present

## 2017-10-18 DIAGNOSIS — R109 Unspecified abdominal pain: Secondary | ICD-10-CM

## 2017-10-18 LAB — WET PREP, GENITAL
Sperm: NONE SEEN
Trich, Wet Prep: NONE SEEN
Yeast Wet Prep HPF POC: NONE SEEN

## 2017-10-18 LAB — COMPREHENSIVE METABOLIC PANEL
ALBUMIN: 3.5 g/dL (ref 3.5–5.0)
ALT: 7 U/L — ABNORMAL LOW (ref 14–54)
ANION GAP: 7 (ref 5–15)
AST: 16 U/L (ref 15–41)
Alkaline Phosphatase: 35 U/L — ABNORMAL LOW (ref 38–126)
BILIRUBIN TOTAL: 0.6 mg/dL (ref 0.3–1.2)
BUN: 12 mg/dL (ref 6–20)
CHLORIDE: 106 mmol/L (ref 101–111)
CO2: 20 mmol/L — ABNORMAL LOW (ref 22–32)
Calcium: 8.5 mg/dL — ABNORMAL LOW (ref 8.9–10.3)
Creatinine, Ser: 0.5 mg/dL (ref 0.44–1.00)
GFR calc Af Amer: >60 mL/min (ref >60)
GLUCOSE: 109 mg/dL — AB (ref 65–99)
POTASSIUM: 3.1 mmol/L — AB (ref 3.5–5.1)
Sodium: 133 mmol/L — ABNORMAL LOW (ref 135–145)
TOTAL PROTEIN: 6.4 g/dL — AB (ref 6.5–8.1)

## 2017-10-18 LAB — CBC WITH DIFFERENTIAL/PLATELET
BASOS ABS: 0 10*3/uL (ref 0.0–0.1)
BASOS PCT: 0 %
Eosinophils Absolute: 0.1 10*3/uL (ref 0.0–0.7)
Eosinophils Relative: 1 %
HEMATOCRIT: 31.8 % — AB (ref 36.0–46.0)
Hemoglobin: 11.1 g/dL — ABNORMAL LOW (ref 12.0–15.0)
Lymphocytes Relative: 12 %
Lymphs Abs: 1.6 10*3/uL (ref 0.7–4.0)
MCH: 31.4 pg (ref 26.0–34.0)
MCHC: 34.9 g/dL (ref 30.0–36.0)
MCV: 89.8 fL (ref 78.0–100.0)
MONO ABS: 0.8 10*3/uL (ref 0.1–1.0)
MONOS PCT: 6 %
NEUTROS ABS: 10.6 10*3/uL (ref 1.7–7.7)
Neutrophils Relative %: 81 %
PLATELETS: 95 10*3/uL — AB (ref 150–400)
RBC: 3.54 MIL/uL — ABNORMAL LOW (ref 3.87–5.11)
RDW: 13 % (ref 11.5–15.5)
WBC: 13 10*3/uL — ABNORMAL HIGH (ref 4.0–10.5)

## 2017-10-18 LAB — URINALYSIS, ROUTINE W REFLEX MICROSCOPIC
Bilirubin Urine: NEGATIVE
GLUCOSE, UA: NEGATIVE mg/dL
Hgb urine dipstick: NEGATIVE
KETONES UR: NEGATIVE mg/dL
LEUKOCYTES UA: NEGATIVE
Nitrite: NEGATIVE
PH: 6 (ref 5.0–8.0)
PROTEIN: NEGATIVE mg/dL
Specific Gravity, Urine: 1.014 (ref 1.005–1.030)

## 2017-10-18 LAB — RAPID URINE DRUG SCREEN, HOSP PERFORMED
Amphetamines: NOT DETECTED
BARBITURATES: NOT DETECTED
BENZODIAZEPINES: NOT DETECTED
COCAINE: NOT DETECTED
OPIATES: NOT DETECTED
TETRAHYDROCANNABINOL: POSITIVE — AB

## 2017-10-18 MED ORDER — METRONIDAZOLE 500 MG PO TABS: 500.0000 mg | ORAL_TABLET | Freq: Two times a day (BID) | ORAL | 0 refills | Status: DC

## 2017-10-18 MED ORDER — ACETAMINOPHEN 325 MG PO TABS
650.0000 mg | ORAL_TABLET | Freq: Once | ORAL | Status: AC
  Administered 2017-10-18: 650 mg via ORAL

## 2017-10-18 MED ORDER — SODIUM CHLORIDE 0.9 % IV BOLUS (SEPSIS)
1000.0000 mL | Freq: Once | INTRAVENOUS | Status: AC
  Administered 2017-10-18: 1000 mL via INTRAVENOUS

## 2017-10-18 MED ORDER — POTASSIUM CHLORIDE CRYS ER 20 MEQ PO TBCR
40.0000 meq | EXTENDED_RELEASE_TABLET | Freq: Once | ORAL | Status: AC
  Administered 2017-10-18: 40 meq via ORAL
  Filled 2017-10-18: qty 2

## 2017-10-18 MED ORDER — ACETAMINOPHEN 325 MG PO TABS
ORAL_TABLET | ORAL | Status: AC
  Filled 2017-10-18: qty 2

## 2017-10-18 MED ORDER — PROMETHAZINE HCL 25 MG/ML IJ SOLN
25.0000 mg | Freq: Once | INTRAMUSCULAR | Status: AC
  Administered 2017-10-18: 25 mg via INTRAVENOUS
  Filled 2017-10-18: qty 1

## 2017-10-18 NOTE — Telephone Encounter (Signed)
No Show Lab/MD rschd. L/M on V/M. Appt also mailed. MF °

## 2017-10-18 NOTE — ED Provider Notes (Signed)
[facility] EMERGENCY DEPARTMENT Provider Note   CSN: [id number] Arrival date & time: 10/18/17  0129     History   Chief Complaint Chief Complaint  Patient presents with  . Abdominal Pain    HPI Dominique Shea is a 27 y.o. female.  Patient presents via EMS with left-sided abdominal pain that woke her from sleep.  States she felt well when she went to bed.  She ate steak and potatoes concerns this may have been bad food.  She has had constant left-sided abdominal pain for the past hour.  She had one episode of vomiting at home and one here.  She is [redacted] weeks pregnant G4P2 without any complications with this pregnancy other than morning sickness.  States she normally does not vomit at night.  Denies any diarrhea or constipation.  Denies any pain with urination or blood in the urine.  Denies any vaginal bleeding or discharge.  Denies any chest pain or shortness of breath.  Denies any sick contacts at home.  She is never had this pain with her previous pregnancies.  She denies feeling contractions her menstrual cramps.   The history is provided by the patient and the EMS personnel.  Abdominal Pain   Associated symptoms include nausea and vomiting. Pertinent negatives include fever, dysuria, frequency, hematuria, headaches, arthralgias and myalgias.    Past Medical History:  Diagnosis Date  . 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 History of gonorrhea 2014  . Thrombocytopenia (HCC)    DURING FIRST PREG    Patient Active Problem List   Diagnosis Date Noted  . Substance abuse affecting pregnancy, antepartum 09/26/2017  . Ptyalism 09/14/2017  . Adjustment disorder with anxiety 09/08/2017  . Hyperemesis gravidarum, antepartum 09/07/2017  . Gonorrhea affecting pregnancy in first trimester 08/24/2017  . Nausea and vomiting during pregnancy prior to [redacted] weeks gestation 08/24/2017  . Hyperemesis gravidarum 08/24/2017  . Supervision of other normal pregnancy, antepartum  08/16/2017  . Thrombocytopenia (HCC) 07/12/2011    Past Surgical History:  Procedure Laterality Date  . CHOLECYSTECTOMY  2012   [facility]  . FRACTURE SURGERY      OB History    Gravida Para Term Preterm AB Living   4 2 2   1 2    SAB TAB Ectopic Multiple Live Births   1       1       Home Medications    Prior to Admission medications   Medication Sig Start Date End Date Taking? Authorizing Provider  cyclobenzaprine (FLEXERIL) 10 MG tablet Take 1 tablet (10 mg total) by mouth 3 (three) times daily as needed for muscle spasms. 09/14/17  Yes Oswaldo Conroy, CNM  Doxylamine-Pyridoxine (DICLEGIS) 10-10 MG TBEC Take 2 tablets by mouth at bedtime. 09/09/17  Yes Farrel Conners, CNM  famotidine (PEPCID) 20 MG tablet Take 1 tablet (20 mg total) by mouth 2 (two) times daily. 09/09/17  Yes Farrel Conners, CNM  metoCLOPramide (REGLAN) 10 MG tablet Take 0.5 tablet tid before each meal and at hs 09/09/17  Yes Farrel Conners, CNM    Family History Family History  Problem Relation Age of Onset  . Hypertension Mother   . Migraines Mother   . Hypertension Father   . Migraines Father   . Migraines Sister   . Migraines Brother     Social History Social History   Tobacco Use  . Smoking status: Current Every Day Smoker    Packs/day: 1.00  Years: 4.00    Pack years: 4.00    Types: Cigarettes  . Smokeless tobacco: Never Used  . Tobacco comment: stopped when found out she was pregnant  Substance Use Topics  . Alcohol use: Yes    Comment: occ  . Drug use: Yes    Types: Marijuana     Allergies   Peanut-containing drug products; Tomato; Benadryl [diphenhydramine]; Doxycycline; Ibuprofen; and Ivp dye [iodinated diagnostic agents]   Review of Systems Review of Systems  Constitutional: Positive for appetite change. Negative for activity change and fever.  HENT: Negative for congestion and rhinorrhea.   Eyes: Negative for visual disturbance.  Respiratory: Negative for  cough, chest tightness and shortness of breath.   Gastrointestinal: Positive for abdominal pain, nausea and vomiting.  Genitourinary: Negative for dysuria, frequency, hematuria, vaginal bleeding and vaginal discharge.  Musculoskeletal: Positive for back pain. Negative for arthralgias and myalgias.  Skin: Negative for rash.  Neurological: Negative for dizziness, weakness and headaches.   all other systems are negative except as noted in the HPI and PMH.    Physical Exam Updated Vital Signs BP (!) 137/99 (BP Location: Left Arm)   Pulse 93   Temp 97.6 F (36.4 C) (Oral)   Resp 18   Ht 5\' 4"  (1.626 m)   Wt 63.5 kg (140 lb)   LMP 06/29/2017   SpO2 100%   BMI 24.03 kg/m   Physical Exam  Constitutional: She is oriented to person, place, and time. She appears well-developed and well-nourished. No distress.  HENT:  Head: Normocephalic and atraumatic.  Mouth/Throat: Oropharynx is clear and moist. No oropharyngeal exudate.  Eyes: Conjunctivae and EOM are normal. Pupils are equal, round, and reactive to light.  Neck: Normal range of motion. Neck supple.  No meningismus.  Cardiovascular: Normal rate, regular rhythm, normal heart sounds and intact distal pulses.  No murmur heard. Pulmonary/Chest: Effort normal and breath sounds normal. No respiratory distress.  Abdominal: Soft. There is tenderness. There is no rebound and no guarding.  Soft. TTP LUQ and LLQ. No guarding or rebound  Genitourinary:  Genitourinary Comments: Chaperone present.  Normal external genitalia.  Friable cervix.  No CMT.  Cervix is closed.  Scant amount of bleeding from os.  Tenderness to palpation of the left adnexa  Musculoskeletal: Normal range of motion. She exhibits no edema or tenderness.  L paraspinal lumbar tenderness  Neurological: She is alert and oriented to person, place, and time. No cranial nerve deficit. She exhibits normal muscle tone. Coordination normal.   5/5 strength throughout. CN 2-12  intact.Equal grip strength.   Skin: Skin is warm.  Psychiatric: She has a normal mood and affect. Her behavior is normal.  Nursing note and vitals reviewed.    ED Treatments / Results  Labs (all labs ordered are listed, but only abnormal results are displayed) Labs Reviewed  WET PREP, GENITAL - Abnormal; Notable for the following components:      Result Value   Clue Cells Wet Prep HPF POC PRESENT (*)    WBC, Wet Prep HPF POC MANY (*)    All other components within normal limits  RAPID URINE DRUG SCREEN, HOSP PERFORMED - Abnormal; Notable for the following components:   Tetrahydrocannabinol POSITIVE (*)    All other components within normal limits  CBC WITH DIFFERENTIAL/PLATELET - Abnormal; Notable for the following components:   WBC 13.0 (*)    RBC 3.54 (*)    Hemoglobin 11.1 (*)    HCT 31.8 (*)  Platelets 95 (*)    All other components within normal limits  COMPREHENSIVE METABOLIC PANEL - Abnormal; Notable for the following components:   Sodium 133 (*)    Potassium 3.1 (*)    CO2 20 (*)    Glucose, Bld 109 (*)    Calcium 8.5 (*)    Total Protein 6.4 (*)    ALT 7 (*)    Alkaline Phosphatase 35 (*)    All other components within normal limits  URINE CULTURE  URINALYSIS, ROUTINE W REFLEX MICROSCOPIC  GC/CHLAMYDIA PROBE AMP ([facility]) NOT AT [facility]    EKG  EKG Interpretation None       Radiology No results found.  Procedures Procedures (including critical care time)  Medications Ordered in ED Medications  sodium chloride 0.9 % bolus 1,000 mL (not administered)  promethazine (PHENERGAN) injection 25 mg (not administered)     Initial Impression / Assessment and Plan / ED Course  I have reviewed the triage vital signs and the nursing notes.  Pertinent labs & imaging results that were available during my care of the patient were reviewed by me and considered in my medical decision making (see chart for details).    G4 P2 at [redacted] weeks gestation presenting  with sudden onset left sided abdominal pain.  Associated with nausea and vomiting.  No urinary symptoms or vaginal bleeding. Bedside ultrasound shows active IUP with fetal heart rate 150. Rh+ not a Rhogam candidate.  Patient's workup is reassuring.  Urinalysis is negative.  Pelvic exam does show positive clue cells and white blood cells.  Will treat for bacterial vaginosis.  No apparent complications of pregnancy which is not yet viable.  Discussed with on-call midwife for [facility] Maryruth Eve.  She agrees that workup is reassuring and patient does not need any further evaluation.  Ovarian cyst considered given patient's left-sided adnexal pain.  Lower suspicion for ovarian torsion as patient appears very comfortable.  US obtained and negative for ovarian torsion. Patient to followup with OB. Treat BV. Cease marijuana use. Return precautions discussed.   EMERGENCY DEPARTMENT [place] PREGNANCY "Study: Limited Ultrasound of the Pelvis for Pregnancy"  INDICATIONS:Pregnancy(required) and Abdominal or pelvic pain Multiple views of the uterus and pelvic cavity were obtained in real-time with a multi-frequency probe.  APPROACH:Transabdominal  PERFORMED BY: Myself IMAGES ARCHIVED?: Yes LIMITATIONS: Emergent procedure PREGNANCY FREE FLUID: None ADNEXAL FINDINGS:Left ovary not seen and Right ovary not seen GESTATIONAL AGE, ESTIMATE: 15 FETAL HEART RATE: 150 INTERPRETATION: Fetal pole present and Fetal heart activity seen      Final Clinical Impressions(s) / ED Diagnoses   Final diagnoses:  Pelvic pain in female  Abdominal pain during pregnancy in second trimester  Bacterial vaginosis    ED Discharge Orders    None       Glynn Octave, MD 10/18/17 [phone]

## 2017-10-18 NOTE — Discharge Instructions (Signed)
Follow-up with your OB doctor this week.  Return to [facility] if you develop worsening pain, vaginal bleeding or any other concerns.

## 2017-10-18 NOTE — ED Triage Notes (Signed)
Pt arrived by EMS from home. Pt complaining of left lower abdominal pain that started about an hour ago. Pt [redacted] weeks pregnant. Pt vomited when moved to stretcher.

## 2017-10-18 NOTE — Progress Notes (Deleted)
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 Patient has been known to have thrombocytopenia at least dating back to 2014 with a platelet counts were between 110s to 120. Most recent CBC is from 02/21/2017 showed white count of 12.3, H&H of 14/40.6 and a platelet count of 82 and on 03/18/2017 a platelet count was 78. Patient currently has an implantable birth control inshe reports irregular periods and also reports on and off headaches. She has been smoking 2L of VAD her headaches. Denies any over-the-counter medications or herbal supplements. Denies any family history of bleeding disorder. Denies any bruising or easy bleeding. She has had prior surgeries including gallbladder surgery without any bleeding complications.   Results of blood work from 04/01/2017 were as follows: CBC showed white count of 7.6, H&H of 13.8/41, platelet count of 89. CMP was within normal limits except for mildly elevated bilirubin of 1.5. B12 and folate were within normal limits. HIV testing was negative. Hep C antibody was negative. Peripheral blood smear review showed thrombocytopenia without clumping. RBCs are unremarkable. WBC and differential within normal limits. No abnormal or immature cells seen     Interval history- ***  ECOG PS- *** Pain scale- *** Opioid associated constipation- ***  Review of systems- Review of Systems  Constitutional: Negative for chills, fever, malaise/fatigue and weight loss.  HENT:  Negative for congestion, ear discharge and nosebleeds.   Eyes: Negative for blurred vision.  Respiratory: Negative for cough, hemoptysis, sputum production, shortness of breath and wheezing.   Cardiovascular: Negative for chest pain, palpitations, orthopnea and claudication.  Gastrointestinal: Negative for abdominal pain, blood in stool, constipation, diarrhea, heartburn, melena, nausea and vomiting.  Genitourinary: Negative for dysuria, flank pain, frequency, hematuria and urgency.  Musculoskeletal: Negative for back pain, joint pain and myalgias.  Skin: Negative for rash.  Neurological: Negative for dizziness, tingling, focal weakness, seizures, weakness and headaches.  Endo/Heme/Allergies: Does not bruise/bleed easily.  Psychiatric/Behavioral: Negative for depression and suicidal ideas. The patient does not have insomnia.       Allergies  Allergen Reactions  . Peanut-Containing Drug Products Anaphylaxis  . Tomato Anaphylaxis  . Benadryl [Diphenhydramine]     Pt reports throat swelling  . Doxycycline     Pt had facial swelling after receiving ms04, zofran, hydrocodone, tylenol, ivp dye, and doxycline. Unclear which was responsible agent.   . Ibuprofen Swelling  . Ivp Dye [Iodinated Diagnostic Agents]     Pt had facial swelling after receiving ms04, zofran, hydrocodone, tylenol, ivp dye, and doxycline. Unclear which was responsible agent.      Past Medical History:  Diagnosis Date  . 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 History of gonorrhea 2014  . Thrombocytopenia (HCC)    DURING FIRST PREG     Past Surgical History:  Procedure Laterality Date  . CHOLECYSTECTOMY  2012   [facility]  . FRACTURE SURGERY      Social History   Socioeconomic History  . Marital status: Single    Spouse name: Not on file  .  Number of children: 2  . Years of education: GED  . Highest education level: Not on file  Social Needs  . Financial resource strain: Not on file  . Food  insecurity - worry: Not on file  . Food insecurity - inability: Not on file  . Transportation needs - medical: Not on file  . Transportation needs - non-medical: Not on file  Occupational History  . Not on file  Tobacco Use  . Smoking status: Current Every Day Smoker    Packs/day: 1.00    Years: 4.00    Pack years: 4.00    Types: Cigarettes  . Smokeless tobacco: Never Used  . Tobacco comment: stopped when found out she was pregnant  Substance and Sexual Activity  . Alcohol use: Yes    Comment: occ  . Drug use: Yes    Types: Marijuana  . Sexual activity: Yes    Birth control/protection: None  Other Topics Concern  . Not on file  Social History Narrative  . Not on file    Family History  Problem Relation Age of Onset  . Hypertension Mother   . Migraines Mother   . Hypertension Father   . Migraines Father   . Migraines Sister   . Migraines Brother     No current facility-administered medications for this visit.   Current Outpatient Medications:  .  cyclobenzaprine (FLEXERIL) 10 MG tablet, Take 1 tablet (10 mg total) by mouth 3 (three) times daily as needed for muscle spasms., Disp: 30 tablet, Rfl: 2 .  Doxylamine-Pyridoxine (DICLEGIS) 10-10 MG TBEC, Take 2 tablets by mouth at bedtime., Disp: 60 tablet, Rfl: 0 .  famotidine (PEPCID) 20 MG tablet, Take 1 tablet (20 mg total) by mouth 2 (two) times daily., Disp: 60 tablet, Rfl: 1 .  metoCLOPramide (REGLAN) 10 MG tablet, Take 0.5 tablet tid before each meal and at hs, Disp: 60 tablet, Rfl: 1 .  metroNIDAZOLE (FLAGYL) 500 MG tablet, Take 1 tablet (500 mg total) by mouth 2 (two) times daily., Disp: 14 tablet, Rfl: 0  Physical exam: There were no vitals filed for this visit. Physical Exam   CMP Latest Ref Rng & Units 10/18/2017  Glucose 65 - 99 mg/dL 161(W)  BUN 6 - 20 mg/dL 12  Creatinine 9.60 - 4.54 mg/dL 0.98  Sodium 119 - 147 mmol/L 133(L)  Potassium 3.5 - 5.1 mmol/L 3.1(L)  Chloride 101 - 111 mmol/L 106  CO2 22 - 32  mmol/L 20(L)  Calcium 8.9 - 10.3 mg/dL 8.2(N)  Total Protein 6.5 - 8.1 g/dL 6.4(L)  Total Bilirubin 0.3 - 1.2 mg/dL 0.6  Alkaline Phos 38 - 126 U/L 35(L)  AST 15 - 41 U/L 16  ALT 14 - 54 U/L 7(L)   CBC Latest Ref Rng & Units 10/18/2017  WBC 4.0 - 10.5 K/uL 13.0(H)  Hemoglobin 12.0 - 15.0 g/dL 11.1(L)  Hematocrit 36.0 - 46.0 % 31.8(L)  Platelets 150 - 400 K/uL 95(L)     Assessment and plan- Patient is a 27 y.o. 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 Thrombocytopenia (HCC)      Dr. Owens Shark, MD, MPH [facility] at [facility] Pager- [phone] 10/18/2017 8:17 AM

## 2017-10-18 NOTE — ED Notes (Signed)
Pt returned from US

## 2017-10-19 LAB — GC/CHLAMYDIA PROBE AMP (~~LOC~~) NOT AT ARMC
Chlamydia: NEGATIVE
Neisseria Gonorrhea: NEGATIVE

## 2017-10-19 LAB — URINE CULTURE

## 2017-10-20 ENCOUNTER — Ambulatory Visit (INDEPENDENT_AMBULATORY_CARE_PROVIDER_SITE_OTHER): Payer: Medicaid Other | Admitting: Maternal Newborn

## 2017-10-20 ENCOUNTER — Encounter: Payer: Self-pay | Admitting: Maternal Newborn

## 2017-10-20 VITALS — BP 120/80 | Wt 138.0 lb

## 2017-10-20 DIAGNOSIS — Z3480 Encounter for supervision of other normal pregnancy, unspecified trimester: Secondary | ICD-10-CM

## 2017-10-20 DIAGNOSIS — R102 Pelvic and perineal pain: Secondary | ICD-10-CM

## 2017-10-20 DIAGNOSIS — Z3689 Encounter for other specified antenatal screening: Secondary | ICD-10-CM

## 2017-10-20 DIAGNOSIS — O26892 Other specified pregnancy related conditions, second trimester: Secondary | ICD-10-CM

## 2017-10-20 NOTE — Progress Notes (Signed)
Prenatal Care Visit - ED Follow-Up  Subjective  Dominique Shea is a 27 y.o. [id number] at [redacted]w[redacted]d being seen today for ongoing prenatal care.  She is currently monitored for the following issues for this high-risk pregnancy and has Thrombocytopenia (HCC); Supervision of other normal pregnancy, antepartum; Gonorrhea affecting pregnancy in first trimester; Nausea and vomiting during pregnancy prior to [redacted] weeks gestation; Hyperemesis gravidarum; Hyperemesis gravidarum, antepartum; Adjustment disorder with anxiety; Ptyalism; and Substance abuse affecting pregnancy, antepartum on their problem list.  ----------------------------------------------------------------------------------- Patient reports pelvic pain on the left side. She was seen in the ED on 1/22 for the same complaint with more severe pain. Normal ultrasound at that visit without concern for ovarian cyst or torsion. Being treated for BV found at that visit. States that the pain has improved and only occurs occasionally now, usually in the early morning hours. It improves with Tylenol and heat. Nausea has improved also and she is only vomiting once in a while. Contractions: Not present. Vag. Bleeding: None.  Movement: Absent. Denies leaking of fluid.  ----------------------------------------------------------------------------------- The following portions of the patient's history were reviewed and updated as appropriate: allergies, current medications, past family history, past medical history, past social history, past surgical history and problem list. Problem list updated.   Objective  Blood pressure 120/80, weight 138 lb (62.6 kg), last menstrual period 06/29/2017. Pregravid weight 140 lb (63.5 kg) Total Weight Gain  (-0.907 kg) Urinalysis:      Fetal Status: Fetal Heart Rate (bpm): 158   Movement: Absent     General:  Alert, oriented and cooperative. Patient is in no acute distress.  Skin: Skin is warm and dry. No rash noted.    Cardiovascular: Normal heart rate noted  Respiratory: Normal respiratory effort, no problems with respiration noted  Abdomen: Soft, gravid, appropriate for gestational age. Pain/Pressure: Present     Pelvic:  Cervical exam deferred        Extremities: Normal range of motion.  Edema: None  Mental Status: Normal mood and affect. Normal behavior. Normal judgment and thought content.     Assessment   26 y.o. [id number] at [redacted]w[redacted]d, EDD 04/10/2018 by Ultrasound presenting for prenatal visit.  Plan   fourth Problems (from 08/16/17 to present)    Problem Noted Resolved   Supervision of other normal pregnancy, antepartum 08/16/2017 by Oswaldo Conroy, CNM No   Overview Addendum 09/14/2017  9:18 AM by Oswaldo Conroy, CNM    Clinic [facility] Prenatal Labs  Dating  Blood type: --/--/A POS (11/24 1248)   Genetic Screen 1 Screen:    AFP:     Quad:     NIPS: Antibody:Negative (11/20 1009)  Anatomic [place]  Rubella: 2.16 (11/20 1009) Varicella:    GTT Early:               Third trimester:  RPR: Non Reactive (11/20 1009)   Rhogam  HBsAg: Negative (11/20 1009)   TDaP vaccine                       Flu Shot: HIV:     Baby Food                                GBS:   Contraception  Pap:  CBB     CS/VBAC    Support Person  Discussed that pain could be musculoskeletal in origin and to continue Tylenol and short applications of heat. Encouraged her to return to care if symptoms become severe again.  Preterm labor symptoms and general obstetric precautions including but not limited to vaginal bleeding, contractions, and leaking of fluid reviewed in detail with the patient.  Return in about 4 weeks (around 11/17/2017) for ROB and anatomy ultrasound.  [name], CNM 10/20/2017  2:57 PM

## 2017-10-21 ENCOUNTER — Telehealth: Payer: Self-pay | Admitting: Oncology

## 2017-10-21 NOTE — Telephone Encounter (Signed)
Appt time moved up per MD request. New appt time conf with Phyllis/Mother. MF

## 2017-10-27 ENCOUNTER — Telehealth: Payer: Self-pay | Admitting: *Deleted

## 2017-10-27 ENCOUNTER — Inpatient Hospital Stay: Payer: Medicaid Other

## 2017-10-27 ENCOUNTER — Inpatient Hospital Stay: Payer: Medicaid Other | Admitting: Oncology

## 2017-10-27 NOTE — Telephone Encounter (Signed)
I called pt due to she has missed 2 appts. The patient states she was never told that she has appts. She would like to come and get checked to see if she needs more iron.  She is agreeable to coming 2/7 3:00 for labs and see md 3:15. appt has been changed

## 2017-10-27 NOTE — Progress Notes (Deleted)
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 Patient has been known to have thrombocytopenia at least dating back to 2014 with a platelet counts were between 110s to 120. Most recent CBC is from 02/21/2017 showed white count of 12.3, H&H of 14/40.6 and a platelet count of 82 and on 03/18/2017 a platelet count was 78. Patient currently has an implantable birth control inshe reports irregular periods and also reports on and off headaches. She has been smoking 2L of VAD her headaches. Denies any over-the-counter medications or herbal supplements. Denies any family history of bleeding disorder. Denies any bruising or easy bleeding. She has had prior surgeries including gallbladder surgery without any bleeding complications.  Results of blood work from 04/01/2017 were as follows: CBC showed white count of 7.6, H&H of 13.8/41, platelet count of 89. CMP was within normal limits except for mildly elevated bilirubin of 1.5. B12 and folate were within normal limits. HIV testing was negative. Hep C antibody was negative. Peripheral blood smear review showed thrombocytopenia without clumping. RBCs are unremarkable. WBC and differential within normal limits. No abnormal or immature cells seen       Interval history- ***  ECOG PS- *** Pain scale- *** Opioid associated constipation- ***  Review of systems- ROS   Current treatment- ***  Allergies  Allergen Reactions  . Peanut-Containing Drug Products  Anaphylaxis  . Tomato Anaphylaxis  . Benadryl [Diphenhydramine]     Pt reports throat swelling  . Doxycycline     Pt had facial swelling after receiving ms04, zofran, hydrocodone, tylenol, ivp dye, and doxycline. Unclear which was responsible agent.   . Ibuprofen Swelling  . Ivp Dye [Iodinated Diagnostic Agents]     Pt had facial swelling after receiving ms04, zofran, hydrocodone, tylenol, ivp dye, and doxycline. Unclear which was responsible agent.      Past Medical History:  Diagnosis Date  . 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 History of gonorrhea 2014  . Thrombocytopenia (HCC)    DURING FIRST PREG     Past Surgical History:  Procedure Laterality Date  . CHOLECYSTECTOMY  2012   [facility]  . FRACTURE SURGERY      Social History   Socioeconomic History  . Marital status: Single    Spouse name: Not on file  . Number of children: 2  . Years of education: GED  . Highest education level: Not on file  Social Needs  . Financial resource strain: Not on file  . Food insecurity - worry: Not on file  . Food insecurity - inability: Not on file  . Transportation needs - medical: Not on file  . Transportation needs - non-medical: Not on file  Occupational History  . Not on file  Tobacco Use  . Smoking status: Current Every Day Smoker    Packs/day: 1.00    Years: 4.00    Pack years: 4.00    Types: Cigarettes  . Smokeless tobacco: Never Used  . Tobacco comment: stopped when found out she was pregnant  Substance  and Sexual Activity  . Alcohol use: Yes    Comment: occ  . Drug use: Yes    Types: Marijuana  . Sexual activity: Yes    Birth control/protection: None  Other Topics Concern  . Not on file  Social History Narrative  . Not on file    Family History  Problem Relation Age of Onset  . Hypertension Mother   . Migraines Mother   . Hypertension Father   . Migraines Father   . Migraines Sister   . Migraines Brother      Current Outpatient Medications:    .  cyclobenzaprine (FLEXERIL) 10 MG tablet, Take 1 tablet (10 mg total) by mouth 3 (three) times daily as needed for muscle spasms., Disp: 30 tablet, Rfl: 2 .  Doxylamine-Pyridoxine (DICLEGIS) 10-10 MG TBEC, Take 2 tablets by mouth at bedtime., Disp: 60 tablet, Rfl: 0 .  famotidine (PEPCID) 20 MG tablet, Take 1 tablet (20 mg total) by mouth 2 (two) times daily., Disp: 60 tablet, Rfl: 1 .  metoCLOPramide (REGLAN) 10 MG tablet, Take 0.5 tablet tid before each meal and at hs, Disp: 60 tablet, Rfl: 1 .  metroNIDAZOLE (FLAGYL) 500 MG tablet, Take 1 tablet (500 mg total) by mouth 2 (two) times daily., Disp: 14 tablet, Rfl: 0  Physical exam: There were no vitals filed for this visit. Physical Exam   CMP Latest Ref Rng & Units 10/18/2017  Glucose 65 - 99 mg/dL 409(W109(H)  BUN 6 - 20 mg/dL 12  Creatinine 1.190.44 - 1.471.00 mg/dL 8.290.50  Sodium 562135 - 130145 mmol/L 133(L)  Potassium 3.5 - 5.1 mmol/L 3.1(L)  Chloride 101 - 111 mmol/L 106  CO2 22 - 32 mmol/L 20(L)  Calcium 8.9 - 10.3 mg/dL 8.6(V8.5(L)  Total Protein 6.5 - 8.1 g/dL 6.4(L)  Total Bilirubin 0.3 - 1.2 mg/dL 0.6  Alkaline Phos 38 - 126 U/L 35(L)  AST 15 - 41 U/L 16  ALT 14 - 54 U/L 7(L)   CBC Latest Ref Rng & Units 10/18/2017  WBC 4.0 - 10.5 K/uL 13.0(H)  Hemoglobin 12.0 - 15.0 g/dL 11.1(L)  Hematocrit 36.0 - 46.0 % 31.8(L)  Platelets 150 - 400 K/uL 95(L)    No images are attached to the encounter.  [place] Ob Limited  Result Date: 10/18/2017 CLINICAL DATA:  Left lower quadrant pain 6 hours. Estimated gestational age per LMP 15 weeks 6 days. EXAM: LIMITED OBSTETRIC ULTRASOUND FINDINGS: Number of Fetuses: 1 Heart Rate:  160 bpm Movement: Yes Presentation: Breech Placental Location: Anterior Previa: No Amniotic Fluid (Subjective):  Within normal limits. BPD:  3.01cm 15w 4d MATERNAL FINDINGS: Cervix:  Appears closed.  3.7 cm in length. Uterus/Adnexae: Right ovary not visualized. Left ovary normal size, shape and position with normal color flow. No free pelvic  fluid. IMPRESSION: Live IUP with estimated gestational age [redacted] weeks 4 days. This exam is performed on an emergent basis and does not comprehensively evaluate fetal size, dating, or anatomy; follow-up complete OB US should be considered if further fetal assessment is warranted. Electronically Signed   By: Elberta Fortisaniel  Boyle M.D.   On: 10/18/2017 08:41   [place] Fetal Nuchal Translucency Measurement  Result Date: 10/18/2017 Review of ULTRASOUND.    I have personally reviewed images and report of [facility].    Plan of management to be discussed with patient. Annamarie MajorPaul Harris, MD, Merlinda FrederickFACOG [facility], [facility] 10/18/2017  9:47 AM   [place] Art/ven Flow Abd Pelv Doppler  Result Date: 10/18/2017  CLINICAL DATA:  Left lower quadrant pain 6 hours. Estimated gestational age per LMP 15 weeks 6 days. EXAM: LIMITED OBSTETRIC ULTRASOUND FINDINGS: Number of Fetuses: 1 Heart Rate:  160 bpm Movement: Yes Presentation: Breech Placental Location: Anterior Previa: No Amniotic Fluid (Subjective):  Within normal limits. BPD:  3.01cm 15w 4d MATERNAL FINDINGS: Cervix:  Appears closed.  3.7 cm in length. Uterus/Adnexae: Right ovary not visualized. Left ovary normal size, shape and position with normal color flow. No free pelvic fluid. IMPRESSION: Live IUP with estimated gestational age [redacted] weeks 4 days. This exam is performed on an emergent basis and does not comprehensively evaluate fetal size, dating, or anatomy; follow-up complete OB US should be considered if further fetal assessment is warranted. Electronically Signed   By: Elberta Fortis M.D.   On: 10/18/2017 08:41     Assessment and plan- Patient is a 27 y.o. 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  Now that she is pregnant there is a possibility that  she may develop severe thrombocytopenia.  However platelet counts of women with ITP decrease at the same rate as women with uncomplicated pregnancies and the rate of decline of platelet counts is similar to women with uncomplicated pregnancies.  I would keep a close eye on her platelet count during her pregnancy and she will need treatment if her platelet counts are less than 20-30,000.  We will aim to keep her platelet count greater than 50,000 as she is nearing her delivery.  Continue to monitor at this time.  Repeat CBC in 6 weeks and 12 weeks and I will see her back in 12 weeks.  She will call us if she notices any worsening bleeding or bruising    Visit Diagnosis 1. Thrombocytopenia affecting pregnancy (HCC)      Dr. Owens Shark, MD, MPH [facility] Pager- [phone] 10/27/2017 8:29 AM

## 2017-11-03 ENCOUNTER — Inpatient Hospital Stay: Payer: Medicaid Other | Attending: Oncology | Admitting: Oncology

## 2017-11-03 ENCOUNTER — Encounter: Payer: Self-pay | Admitting: Oncology

## 2017-11-03 ENCOUNTER — Inpatient Hospital Stay: Payer: Medicaid Other

## 2017-11-03 ENCOUNTER — Ambulatory Visit (INDEPENDENT_AMBULATORY_CARE_PROVIDER_SITE_OTHER): Payer: Medicaid Other | Admitting: Obstetrics & Gynecology

## 2017-11-03 VITALS — BP 116/80 | HR 74 | Temp 98.3°F | Ht 64.0 in | Wt 137.5 lb

## 2017-11-03 VITALS — BP 120/80 | Wt 136.0 lb

## 2017-11-03 DIAGNOSIS — Z3A16 16 weeks gestation of pregnancy: Secondary | ICD-10-CM

## 2017-11-03 DIAGNOSIS — O368120 Decreased fetal movements, second trimester, not applicable or unspecified: Secondary | ICD-10-CM

## 2017-11-03 DIAGNOSIS — Z3A17 17 weeks gestation of pregnancy: Secondary | ICD-10-CM

## 2017-11-03 DIAGNOSIS — Z79899 Other long term (current) drug therapy: Secondary | ICD-10-CM | POA: Diagnosis not present

## 2017-11-03 DIAGNOSIS — F17210 Nicotine dependence, cigarettes, uncomplicated: Secondary | ICD-10-CM | POA: Insufficient documentation

## 2017-11-03 DIAGNOSIS — D693 Immune thrombocytopenic purpura: Secondary | ICD-10-CM

## 2017-11-03 DIAGNOSIS — O99012 Anemia complicating pregnancy, second trimester: Secondary | ICD-10-CM | POA: Insufficient documentation

## 2017-11-03 DIAGNOSIS — D649 Anemia, unspecified: Secondary | ICD-10-CM

## 2017-11-03 LAB — CBC WITH DIFFERENTIAL/PLATELET
BASOS ABS: 0.1 10*3/uL (ref 0–0.1)
Basophils Relative: 1 %
EOS PCT: 1 %
Eosinophils Absolute: 0.1 10*3/uL (ref 0–0.7)
HEMATOCRIT: 31.6 % — AB (ref 35.0–47.0)
Hemoglobin: 10.8 g/dL — ABNORMAL LOW (ref 12.0–16.0)
LYMPHS PCT: 20 %
Lymphs Abs: 2.2 10*3/uL (ref 1.0–3.6)
MCH: 31.7 pg (ref 26.0–34.0)
MCHC: 34.3 g/dL (ref 32.0–36.0)
MCV: 92.5 fL (ref 80.0–100.0)
MONO ABS: 0.8 10*3/uL (ref 0.2–0.9)
MONOS PCT: 7 %
NEUTROS ABS: 8.2 10*3/uL — AB (ref 1.4–6.5)
Neutrophils Relative %: 71 %
PLATELETS: 108 10*3/uL — AB (ref 150–440)
RBC: 3.41 MIL/uL — ABNORMAL LOW (ref 3.80–5.20)
RDW: 13.3 % (ref 11.5–14.5)
WBC: 11.4 10*3/uL — ABNORMAL HIGH (ref 3.6–11.0)

## 2017-11-03 LAB — RETICULOCYTES
RBC.: 3.61 MIL/uL — AB (ref 3.80–5.20)
RETIC CT PCT: 1.4 % (ref 0.4–3.1)
Retic Count, Absolute: 50.5 10*3/uL (ref 19.0–183.0)

## 2017-11-03 LAB — IRON AND TIBC
Iron: 90 ug/dL (ref 28–170)
SATURATION RATIOS: 30 % (ref 10.4–31.8)
TIBC: 302 ug/dL (ref 250–450)
UIBC: 212 ug/dL

## 2017-11-03 LAB — FOLATE: Folate: 10.5 ng/mL (ref >5.9)

## 2017-11-03 LAB — TSH: TSH: 1.019 u[IU]/mL (ref 0.350–4.500)

## 2017-11-03 LAB — FERRITIN: Ferritin: 62 ng/mL (ref 11–307)

## 2017-11-03 LAB — VITAMIN B12: Vitamin B-12: 244 pg/mL (ref 180–914)

## 2017-11-03 NOTE — Progress Notes (Signed)
No new changes noted with this visit. 

## 2017-11-03 NOTE — Progress Notes (Signed)
  Subjective  Fetal Movement? Decreased from what she is used to, concerned Contractions? no Leaking Fluid? no Vaginal Bleeding? no  Objective  BP 120/80   Wt 136 lb (61.7 kg)   LMP 06/29/2017   BMI 23.34 kg/m  General: NAD Pumonary: no increased work of breathing Abdomen: gravid, non-tender Extremities: no edema Psychiatric: mood appropriate, affect full  Assessment  26 y.o. [id number] at 4771w3d by  04/10/2018, by Ultrasound presenting for routine prenatal visit  Plan   Problem List Items Addressed This Visit    None    FHT reassuring today  Annamarie MajorPaul Anitra Doxtater, MD, Merlinda FrederickFACOG [facility], [facility] 11/03/2017  4:24 PM

## 2017-11-04 ENCOUNTER — Other Ambulatory Visit: Payer: Self-pay | Admitting: *Deleted

## 2017-11-04 ENCOUNTER — Telehealth: Payer: Self-pay | Admitting: *Deleted

## 2017-11-04 DIAGNOSIS — D696 Thrombocytopenia, unspecified: Secondary | ICD-10-CM

## 2017-11-04 LAB — HAPTOGLOBIN: Haptoglobin: 117 mg/dL (ref 34–200)

## 2017-11-04 NOTE — Progress Notes (Signed)
Hematology/Oncology Consult note [facility]  [phone] [phone]  Patient Care Team: Kathrynn Running, MD as PCP - General (Obstetrics and Gynecology)   Name of the patient: Dominique Shea  [id number]  Jan 08, 1991   Date of visit: 11/04/17  Diagnosis-thrombocytopenia likely due to ITPnow pregnant   Chief complaint/ Reason for visit-routine f/u of thrombocytopenia  Heme/Onc history:patient is a 27 year old African-American female who was been referred to [place] for thrombocytopenia. Patient has been known to have thrombocytopenia at least dating back to 2014 with a platelet counts were between 110s to 120. Most recent CBC is from 02/21/2017 showed white count of 12.3, H&H of 14/40.6 and a platelet count of 82 and on 03/18/2017 a platelet count was 78. Patient currently has an implantable birth control inshe reports irregular periods and also reports on and off headaches. She has been smoking 2L of VAD her headaches. Denies any over-the-counter medications or herbal supplements. Denies any family history of bleeding disorder. Denies any bruising or easy bleeding. She has had prior surgeries including gallbladder surgery without any bleeding complications.  Results of blood work from 04/01/2017 were as follows: CBC showed white count of 7.6, H&H of 13.8/41, platelet count of 89. CMP was within normal limits except for mildly elevated bilirubin of 1.5. B12 and folate were within normal limits. HIV testing was negative. Hep C antibody was negative. Peripheral blood smear review showed thrombocytopenia without clumping. RBCs are unremarkable. WBC and differential within normal limits. No abnormal or immaturecells seen   Interval history-patient is currently [redacted] weeks pregnant.  She reports that she could feel her baby move/flutter in the past but over the last 1 week she has not felt any fetal movement.  She denies any symptoms of bleeding  or bruising.  She was admitted to the hospital recently for hyperemesis gravidarum which has now improved  ECOG PS- 0 Pain scale- 0   Review of systems- Review of Systems  Constitutional: Positive for malaise/fatigue. Negative for chills, fever and weight loss.  HENT: Negative for congestion, ear discharge and nosebleeds.   Eyes: Negative for blurred vision.  Respiratory: Negative for cough, hemoptysis, sputum production, shortness of breath and wheezing.   Cardiovascular: Negative for chest pain, palpitations, orthopnea and claudication.  Gastrointestinal: Negative for abdominal pain, blood in stool, constipation, diarrhea, heartburn, melena, nausea and vomiting.  Genitourinary: Negative for dysuria, flank pain, frequency, hematuria and urgency.  Musculoskeletal: Negative for back pain, joint pain and myalgias.  Skin: Negative for rash.  Neurological: Negative for dizziness, tingling, focal weakness, seizures, weakness and headaches.  Endo/Heme/Allergies: Does not bruise/bleed easily.  Psychiatric/Behavioral: Negative for depression and suicidal ideas. The patient does not have insomnia.       Allergies  Allergen Reactions  . Peanut-Containing Drug Products Anaphylaxis  . Tomato Anaphylaxis  . Benadryl [Diphenhydramine]     Pt reports throat swelling  . Doxycycline     Pt had facial swelling after receiving ms04, zofran, hydrocodone, tylenol, ivp dye, and doxycline. Unclear which was responsible agent.   . Ibuprofen Swelling  . Ivp Dye [Iodinated Diagnostic Agents]     Pt had facial swelling after receiving ms04, zofran, hydrocodone, tylenol, ivp dye, and doxycline. Unclear which was responsible agent.      Past Medical History:  Diagnosis Date  . 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 History of gonorrhea 2014  . Thrombocytopenia (HCC)    DURING FIRST PREG  Past Surgical History:  Procedure Laterality Date  . CHOLECYSTECTOMY  2012   [facility]  . FRACTURE  SURGERY      Social History   Socioeconomic History  . Marital status: Single    Spouse name: Not on file  . Number of children: 2  . Years of education: GED  . Highest education level: Not on file  Social Needs  . Financial resource strain: Not on file  . Food insecurity - worry: Not on file  . Food insecurity - inability: Not on file  . Transportation needs - medical: Not on file  . Transportation needs - non-medical: Not on file  Occupational History  . Not on file  Tobacco Use  . Smoking status: Current Every Day Smoker    Packs/day: 1.00    Years: 4.00    Pack years: 4.00    Types: Cigarettes  . Smokeless tobacco: Never Used  . Tobacco comment: stopped when found out she was pregnant  Substance and Sexual Activity  . Alcohol use: Yes    Comment: occ  . Drug use: Yes    Types: Marijuana  . Sexual activity: Yes    Birth control/protection: None  Other Topics Concern  . Not on file  Social History Narrative  . Not on file    Family History  Problem Relation Age of Onset  . Hypertension Mother   . Migraines Mother   . Hypertension Father   . Migraines Father   . Migraines Sister   . Migraines Brother      Current Outpatient Medications:  .  cyclobenzaprine (FLEXERIL) 10 MG tablet, Take 1 tablet (10 mg total) by mouth 3 (three) times daily as needed for muscle spasms. (Patient not taking: Reported on 11/03/2017), Disp: 30 tablet, Rfl: 2 .  Doxylamine-Pyridoxine (DICLEGIS) 10-10 MG TBEC, Take 2 tablets by mouth at bedtime. (Patient not taking: Reported on 11/03/2017), Disp: 60 tablet, Rfl: 0 .  famotidine (PEPCID) 20 MG tablet, Take 1 tablet (20 mg total) by mouth 2 (two) times daily. (Patient not taking: Reported on 11/03/2017), Disp: 60 tablet, Rfl: 1 .  metoCLOPramide (REGLAN) 10 MG tablet, Take 0.5 tablet tid before each meal and at hs (Patient not taking: Reported on 11/03/2017), Disp: 60 tablet, Rfl: 1 .  metroNIDAZOLE (FLAGYL) 500 MG tablet, Take 1 tablet (500 mg  total) by mouth 2 (two) times daily. (Patient not taking: Reported on 11/03/2017), Disp: 14 tablet, Rfl: 0  Physical exam:  Vitals:   11/03/17 1519  BP: 116/80  Pulse: 74  Temp: 98.3 F (36.8 C)  TempSrc: Tympanic  Weight: 137 lb 8 oz (62.4 kg)  Height: 5\' 4"  (1.626 m)   Physical Exam  Constitutional: She is oriented to person, place, and time and well-developed, well-nourished, and in no distress.  HENT:  Head: Normocephalic and atraumatic.  Eyes: EOM are normal. Pupils are equal, round, and reactive to light.  Neck: Normal range of motion.  Cardiovascular: Normal rate, regular rhythm and normal heart sounds.  Pulmonary/Chest: Effort normal and breath sounds normal.  Abdominal: Soft. Bowel sounds are normal.  Gravid uterus +  Neurological: She is alert and oriented to person, place, and time.  Skin: Skin is warm and dry.     CMP Latest Ref Rng & Units 10/18/2017  Glucose 65 - 99 mg/dL 130(Q)  BUN 6 - 20 mg/dL 12  Creatinine 6.57 - 8.46 mg/dL 9.62  Sodium 952 - 841 mmol/L 133(L)  Potassium 3.5 - 5.1 mmol/L 3.1(L)  Chloride 101 - 111 mmol/L 106  CO2 22 - 32 mmol/L 20(L)  Calcium 8.9 - 10.3 mg/dL 0.4(V8.5(L)  Total Protein 6.5 - 8.1 g/dL 6.4(L)  Total Bilirubin 0.3 - 1.2 mg/dL 0.6  Alkaline Phos 38 - 126 U/L 35(L)  AST 15 - 41 U/L 16  ALT 14 - 54 U/L 7(L)   CBC Latest Ref Rng & Units 11/03/2017  WBC 3.6 - 11.0 K/uL 11.4(H)  Hemoglobin 12.0 - 16.0 g/dL 10.8(L)  Hematocrit 35.0 - 47.0 % 31.6(L)  Platelets 150 - 440 K/uL 108(L)    No images are attached to the encounter.  [place] Ob Limited  Result Date: 10/18/2017 CLINICAL DATA:  Left lower quadrant pain 6 hours. Estimated gestational age per LMP 15 weeks 6 days. EXAM: LIMITED OBSTETRIC ULTRASOUND FINDINGS: Number of Fetuses: 1 Heart Rate:  160 bpm Movement: Yes Presentation: Breech Placental Location: Anterior Previa: No Amniotic Fluid (Subjective):  Within normal limits. BPD:  3.01cm 15w 4d MATERNAL FINDINGS: Cervix:  Appears  closed.  3.7 cm in length. Uterus/Adnexae: Right ovary not visualized. Left ovary normal size, shape and position with normal color flow. No free pelvic fluid. IMPRESSION: Live IUP with estimated gestational age [redacted] weeks 4 days. This exam is performed on an emergent basis and does not comprehensively evaluate fetal size, dating, or anatomy; follow-up complete OB US should be considered if further fetal assessment is warranted. Electronically Signed   By: Elberta Fortisaniel  Boyle M.D.   On: 10/18/2017 08:41   [place] Fetal Nuchal Translucency Measurement  Result Date: 10/18/2017 Review of ULTRASOUND.    [facility]    Plan of management to be discussed with patient. Annamarie MajorPaul Harris, MD, Merlinda FrederickFACOG [facility], [facility] 10/18/2017  9:47 AM   [place] Art/ven Flow Abd Pelv Doppler  Result Date: 10/18/2017 CLINICAL DATA:  Left lower quadrant pain 6 hours. Estimated gestational age per LMP 15 weeks 6 days. EXAM: LIMITED OBSTETRIC ULTRASOUND FINDINGS: Number of Fetuses: 1 Heart Rate:  160 bpm Movement: Yes Presentation: Breech Placental Location: Anterior Previa: No Amniotic Fluid (Subjective):  Within normal limits. BPD:  3.01cm 15w 4d MATERNAL FINDINGS: Cervix:  Appears closed.  3.7 cm in length. Uterus/Adnexae: Right ovary not visualized. Left ovary normal size, shape and position with normal color flow. No free pelvic fluid. IMPRESSION: Live IUP with estimated gestational age [redacted] weeks 4 days. This exam is performed on an emergent basis and does not comprehensively evaluate fetal size, dating, or anatomy; follow-up complete OB US should be considered if further fetal assessment is warranted. Electronically Signed   By: Elberta Fortisaniel  Boyle M.D.   On: 10/18/2017 08:41     Assessment and plan- Patient is a 27 y.o. female isolated thrombocytopenia likely due to ITP now in her 1st trimester of pregnancy  Patient was noted to have isolated thrombocytopenia  even prior to her pregnancy likely secondary to ITP and has not required treatment so far.  Now that she is pregnant there is a possibility that she may develop severe thrombocytopenia.  However platelet counts of women with ITP decrease at the same rate as women with uncomplicated pregnancies and the rate of decline of platelet counts is similar to women with uncomplicated pregnancies.  I would keep a close eye on her platelet count during her pregnancy and she will need treatment if her platelet counts are less than 20-30,000.  We will aim to keep her platelet count greater than 50,000  as she is nearing her delivery.  On today's lab patient is also noted to have normocytic anemia with an H&H of 10.8/31.6.  Her prior hemoglobin over the last 3-4 months was normal around 13.  I will therefore proceed with complete anemia workup including TSH, iron studies and ferritin, B12, folate, TSH, haptoglobin and reticulocyte count today  I will see her back in 2 months time with repeat CBC ferritin and iron studies B12 and folate.  She will call us if she notices any worsening bleeding or bruising we have also contacted [facility] given her's  We did get in touch with [facility] regarding patient's complaints of decreased fetal movement and they will get in touch with her for further follow-up     Visit Diagnosis 1. Anemia affecting pregnancy in second trimester   2. Chronic ITP (idiopathic thrombocytopenia) (HCC)      Dr. Owens Shark, MD, MPH [facility] at [facility] Pager- [phone] 11/04/2017 12:26 PM   Addendum: Labs are indicative of mild B12 deficiency given that her B12 levels are low normal at 244.  She has been asked to take thousand micrograms of oral B12 at this point and if she continues to have low levels at 2 months I will switch her to monthly B12 shots at that time,   Dr. Owens Shark, MD, MPH [facility] at [facility] [phone] 11/04/2017 12:31  PM

## 2017-11-04 NOTE — Telephone Encounter (Signed)
Per Dr. Assunta Gamblesao's request, spoke to patient via telephone and instructed her to take Vitamin B12 1000 mcg daily. Also told her we will check her B12 level and iron levels at her next visit. Patient verbalized understanding.     dhs

## 2017-11-17 ENCOUNTER — Encounter: Payer: Medicaid Other | Admitting: Obstetrics & Gynecology

## 2017-11-17 ENCOUNTER — Other Ambulatory Visit: Payer: Medicaid Other

## 2017-11-25 ENCOUNTER — Ambulatory Visit (INDEPENDENT_AMBULATORY_CARE_PROVIDER_SITE_OTHER): Payer: Medicaid Other | Admitting: Advanced Practice Midwife

## 2017-11-25 ENCOUNTER — Ambulatory Visit (INDEPENDENT_AMBULATORY_CARE_PROVIDER_SITE_OTHER): Payer: Medicaid Other

## 2017-11-25 VITALS — BP 120/80 | Wt 141.0 lb

## 2017-11-25 DIAGNOSIS — Z3689 Encounter for other specified antenatal screening: Secondary | ICD-10-CM

## 2017-11-25 DIAGNOSIS — Z3A20 20 weeks gestation of pregnancy: Secondary | ICD-10-CM

## 2017-11-25 DIAGNOSIS — Z3480 Encounter for supervision of other normal pregnancy, unspecified trimester: Secondary | ICD-10-CM

## 2017-11-25 NOTE — Progress Notes (Signed)
  Routine Prenatal Care Visit  Subjective  Dominique Shea is a 27 y.o. [phone] at 4828w4d being seen today for ongoing prenatal care.  She is currently monitored for the following issues for this low-risk pregnancy and has Thrombocytopenia (HCC); Supervision of other normal pregnancy, antepartum; Gonorrhea affecting pregnancy in first trimester; Nausea and vomiting during pregnancy prior to [redacted] weeks gestation; Hyperemesis gravidarum; Hyperemesis gravidarum, antepartum; Adjustment disorder with anxiety; Ptyalism; and Substance abuse affecting pregnancy, antepartum on their problem list.  ----------------------------------------------------------------------------------- Patient reports no complaints.   Contractions: Not present. Vag. Bleeding: None.  Movement: Present. Denies leaking of fluid.  ----------------------------------------------------------------------------------- The following portions of the patient's history were reviewed and updated as appropriate: allergies, current medications, past family history, past medical history, past social history, past surgical history and problem list. Problem list updated.   Objective  Blood pressure 120/80, weight 141 lb (64 kg), last menstrual period 06/29/2017. Pregravid weight 140 lb (63.5 kg) Total Weight Gain 1 lb (0.454 kg) Urinalysis: Urine Protein: Negative Urine Glucose: Negative  Fetal Status: Fetal Heart Rate (bpm): 134   Movement: Present     Anatomy scan today is incomplete for cardiac and face due to fetal position  General:  Alert, oriented and cooperative. Patient is in no acute distress.  Skin: Skin is warm and dry. No rash noted.   Cardiovascular: Normal heart rate noted  Respiratory: Normal respiratory effort, no problems with respiration noted  Abdomen: Soft, gravid, appropriate for gestational age. Pain/Pressure: Present     Pelvic:  Cervical exam deferred        Extremities: Normal range of motion.  Edema: None  Mental  Status: Normal mood and affect. Normal behavior. Normal judgment and thought content.   Assessment   27 y.o. [id number] at 428w4d by  04/10/2018, by Ultrasound presenting for routine prenatal visit  Plan   fourth Problems (from 08/16/17 to present)    Problem Noted Resolved   Supervision of other normal pregnancy, antepartum 08/16/2017 by Oswaldo ConroySchmid, Jacelyn Y, CNM No   Overview Addendum 09/14/2017  9:18 AM by Oswaldo ConroySchmid, Jacelyn Y, CNM    Clinic [facility] Prenatal Labs  Dating  Blood type: --/--/A POS (11/24 1248)   Genetic Screen 1 Screen:    AFP:     Quad:     NIPS: Antibody:Negative (11/20 1009)  Anatomic US  Rubella: 2.16 (11/20 1009) Varicella:    GTT Early:               Third trimester:  RPR: Non Reactive (11/20 1009)   Rhogam  HBsAg: Negative (11/20 1009)   TDaP vaccine                       Flu Shot: HIV:     Baby Food                                GBS:   Contraception  Pap:  CBB     CS/VBAC    Support Person                  Preterm labor symptoms and general obstetric precautions including but not limited to vaginal bleeding, contractions, leaking of fluid and fetal movement were reviewed in detail with the patient.    Return in about 4 weeks (around 12/23/2017) for f/u anatomy scan and rob.  Tresea MallJane Foye Haggart, CNM 11/25/2017 10:04 AM

## 2017-12-23 ENCOUNTER — Ambulatory Visit (INDEPENDENT_AMBULATORY_CARE_PROVIDER_SITE_OTHER): Payer: Medicaid Other | Admitting: Advanced Practice Midwife

## 2017-12-23 ENCOUNTER — Ambulatory Visit (INDEPENDENT_AMBULATORY_CARE_PROVIDER_SITE_OTHER): Payer: Medicaid Other

## 2017-12-23 ENCOUNTER — Encounter: Payer: Self-pay | Admitting: Advanced Practice Midwife

## 2017-12-23 VITALS — BP 120/78 | Wt 155.0 lb

## 2017-12-23 DIAGNOSIS — Z130 Encounter for screening for diseases of the blood and blood-forming organs and certain disorders involving the immune mechanism: Secondary | ICD-10-CM

## 2017-12-23 DIAGNOSIS — Z113 Encounter for screening for infections with a predominantly sexual mode of transmission: Secondary | ICD-10-CM

## 2017-12-23 DIAGNOSIS — Z131 Encounter for screening for diabetes mellitus: Secondary | ICD-10-CM

## 2017-12-23 DIAGNOSIS — Z3A24 24 weeks gestation of pregnancy: Secondary | ICD-10-CM

## 2017-12-23 DIAGNOSIS — Z3480 Encounter for supervision of other normal pregnancy, unspecified trimester: Secondary | ICD-10-CM

## 2017-12-23 NOTE — Patient Instructions (Signed)
Third Trimester of Pregnancy The third trimester is from week 28 through week 40 (months 7 through 9). The third trimester is a time when the unborn baby (fetus) is growing rapidly. At the end of the ninth month, the fetus is about 20 inches in length and weighs 6-10 pounds. Body changes during your third trimester Your body will continue to go through many changes during pregnancy. The changes vary from woman to woman. During the third trimester:  Your weight will continue to increase. You can expect to gain 25-35 pounds (11-16 kg) by the end of the pregnancy.  You may begin to get stretch marks on your hips, abdomen, and breasts.  You may urinate more often because the fetus is moving lower into your pelvis and pressing on your bladder.  You may develop or continue to have heartburn. This is caused by increased hormones that slow down muscles in the digestive tract.  You may develop or continue to have constipation because increased hormones slow digestion and cause the muscles that push waste through your intestines to relax.  You may develop hemorrhoids. These are swollen veins (varicose veins) in the rectum that can itch or be painful.  You may develop swollen, bulging veins (varicose veins) in your legs.  You may have increased body aches in the pelvis, back, or thighs. This is due to weight gain and increased hormones that are relaxing your joints.  You may have changes in your hair. These can include thickening of your hair, rapid growth, and changes in texture. Some women also have hair loss during or after pregnancy, or hair that feels dry or thin. Your hair will most likely return to normal after your baby is born.  Your breasts will continue to grow and they will continue to become tender. A yellow fluid (colostrum) may leak from your breasts. This is the first milk you are producing for your baby.  Your belly button may stick out.  You may notice more swelling in your hands,  face, or ankles.  You may have increased tingling or numbness in your hands, arms, and legs. The skin on your belly may also feel numb.  You may feel short of breath because of your expanding uterus.  You may have more problems sleeping. This can be caused by the size of your belly, increased need to urinate, and an increase in your body's metabolism.  You may notice the fetus "dropping," or moving lower in your abdomen (lightening).  You may have increased vaginal discharge.  You may notice your joints feel loose and you may have pain around your pelvic bone.  What to expect at prenatal visits You will have prenatal exams every 2 weeks until week 36. Then you will have weekly prenatal exams. During a routine prenatal visit:  You will be weighed to make sure you and the baby are growing normally.  Your blood pressure will be taken.  Your abdomen will be measured to track your baby's growth.  The fetal heartbeat will be listened to.  Any test results from the previous visit will be discussed.  You may have a cervical check near your due date to see if your cervix has softened or thinned (effaced).  You will be tested for Group B streptococcus. This happens between 35 and 37 weeks.  Your health care provider may ask you:  What your birth plan is.  How you are feeling.  If you are feeling the baby move.  If you have had   any abnormal symptoms, such as leaking fluid, bleeding, severe headaches, or abdominal cramping.  If you are using any tobacco products, including cigarettes, chewing tobacco, and electronic cigarettes.  If you have any questions.  Other tests or screenings that may be performed during your third trimester include:  Blood tests that check for low iron levels (anemia).  Fetal testing to check the health, activity level, and growth of the fetus. Testing is done if you have certain medical conditions or if there are problems during the  pregnancy.  Nonstress test (NST). This test checks the health of your baby to make sure there are no signs of problems, such as the baby not getting enough oxygen. During this test, a belt is placed around your belly. The baby is made to move, and its heart rate is monitored during movement.  What is false labor? False labor is a condition in which you feel small, irregular tightenings of the muscles in the womb (contractions) that usually go away with rest, changing position, or drinking water. These are called Braxton Hicks contractions. Contractions may last for hours, days, or even weeks before true labor sets in. If contractions come at regular intervals, become more frequent, increase in intensity, or become painful, you should see your health care provider. What are the signs of labor?  Abdominal cramps.  Regular contractions that start at 10 minutes apart and become stronger and more frequent with time.  Contractions that start on the top of the uterus and spread down to the lower abdomen and back.  Increased pelvic pressure and dull back pain.  A watery or bloody mucus discharge that comes from the vagina.  Leaking of amniotic fluid. This is also known as your "water breaking." It could be a slow trickle or a gush. Let your health care provider know if it has a color or strange odor. If you have any of these signs, call your health care provider right away, even if it is before your due date. Follow these instructions at home: Medicines  Follow your health care provider's instructions regarding medicine use. Specific medicines may be either safe or unsafe to take during pregnancy.  Take a prenatal vitamin that contains at least 600 micrograms (mcg) of folic acid.  If you develop constipation, try taking a stool softener if your health care provider approves. Eating and drinking  Eat a balanced diet that includes fresh fruits and vegetables, whole grains, good sources of protein  such as meat, eggs, or tofu, and low-fat dairy. Your health care provider will help you determine the amount of weight gain that is right for you.  Avoid raw meat and uncooked cheese. These carry germs that can cause birth defects in the baby.  If you have low calcium intake from food, talk to your health care provider about whether you should take a daily calcium supplement.  Eat four or five small meals rather than three large meals a day.  Limit foods that are high in fat and processed sugars, such as fried and sweet foods.  To prevent constipation: ? Drink enough fluid to keep your urine clear or pale yellow. ? Eat foods that are high in fiber, such as fresh fruits and vegetables, whole grains, and beans. Activity  Exercise only as directed by your health care provider. Most women can continue their usual exercise routine during pregnancy. Try to exercise for 30 minutes at least 5 days a week. Stop exercising if you experience uterine contractions.  Avoid heavy   lifting.  Do not exercise in extreme heat or humidity, or at high altitudes.  Wear low-heel, comfortable shoes.  Practice good posture.  You may continue to have sex unless your health care provider tells you otherwise. Relieving pain and discomfort  Take frequent breaks and rest with your legs elevated if you have leg cramps or low back pain.  Take warm sitz baths to soothe any pain or discomfort caused by hemorrhoids. Use hemorrhoid cream if your health care provider approves.  Wear a good support bra to prevent discomfort from breast tenderness.  If you develop varicose veins: ? Wear support pantyhose or compression stockings as told by your healthcare provider. ? Elevate your feet for 15 minutes, 3-4 times a day. Prenatal care  Write down your questions. Take them to your prenatal visits.  Keep all your prenatal visits as told by your health care provider. This is important. Safety  Wear your seat belt at  all times when driving.  Make a list of emergency phone numbers, including numbers for family, friends, the hospital, and police and fire departments. General instructions  Avoid cat litter boxes and soil used by cats. These carry germs that can cause birth defects in the baby. If you have a cat, ask someone to clean the litter box for you.  Do not travel far distances unless it is absolutely necessary and only with the approval of your health care provider.  Do not use hot tubs, steam rooms, or saunas.  Do not drink alcohol.  Do not use any products that contain nicotine or tobacco, such as cigarettes and e-cigarettes. If you need help quitting, ask your health care provider.  Do not use any medicinal herbs or unprescribed drugs. These chemicals affect the formation and growth of the baby.  Do not douche or use tampons or scented sanitary pads.  Do not cross your legs for long periods of time.  To prepare for the arrival of your baby: ? Take prenatal classes to understand, practice, and ask questions about labor and delivery. ? Make a trial run to the hospital. ? Visit the hospital and tour the maternity area. ? Arrange for maternity or paternity leave through employers. ? Arrange for family and friends to take care of pets while you are in the hospital. ? Purchase a rear-facing car seat and make sure you know how to install it in your car. ? Pack your hospital bag. ? Prepare the baby's nursery. Make sure to remove all pillows and stuffed animals from the baby's crib to prevent suffocation.  Visit your dentist if you have not gone during your pregnancy. Use a soft toothbrush to brush your teeth and be gentle when you floss. Contact a health care provider if:  You are unsure if you are in labor or if your water has broken.  You become dizzy.  You have mild pelvic cramps, pelvic pressure, or nagging pain in your abdominal area.  You have lower back pain.  You have persistent  nausea, vomiting, or diarrhea.  You have an unusual or bad smelling vaginal discharge.  You have pain when you urinate. Get help right away if:  Your water breaks before 37 weeks.  You have regular contractions less than 5 minutes apart before 37 weeks.  You have a fever.  You are leaking fluid from your vagina.  You have spotting or bleeding from your vagina.  You have severe abdominal pain or cramping.  You have rapid weight loss or weight gain.    You have shortness of breath with chest pain.  You notice sudden or extreme swelling of your face, hands, ankles, feet, or legs.  Your baby makes fewer than 10 movements in 2 hours.  You have severe headaches that do not go away when you take medicine.  You have vision changes. Summary  The third trimester is from week 28 through week 40, months 7 through 9. The third trimester is a time when the unborn baby (fetus) is growing rapidly.  During the third trimester, your discomfort may increase as you and your baby continue to gain weight. You may have abdominal, leg, and back pain, sleeping problems, and an increased need to urinate.  During the third trimester your breasts will keep growing and they will continue to become tender. A yellow fluid (colostrum) may leak from your breasts. This is the first milk you are producing for your baby.  False labor is a condition in which you feel small, irregular tightenings of the muscles in the womb (contractions) that eventually go away. These are called Braxton Hicks contractions. Contractions may last for hours, days, or even weeks before true labor sets in.  Signs of labor can include: abdominal cramps; regular contractions that start at 10 minutes apart and become stronger and more frequent with time; watery or bloody mucus discharge that comes from the vagina; increased pelvic pressure and dull back pain; and leaking of amniotic fluid. This information is not intended to replace advice  given to you by your health care provider. Make sure you discuss any questions you have with your health care provider. Document Released: 09/07/2001 Document Revised: 02/19/2016 Document Reviewed: 11/14/2012 Elsevier Interactive Patient Education  2017 [employer]  

## 2017-12-23 NOTE — Progress Notes (Signed)
  Routine Prenatal Care Visit  Subjective  Dominique Shea is a 27 y.o. [phone] at 3775w4d being seen today for ongoing prenatal care.  She is currently monitored for the following issues for this high-risk pregnancy and has Thrombocytopenia (HCC); Supervision of other normal pregnancy, antepartum; Gonorrhea affecting pregnancy in first trimester; Nausea and vomiting during pregnancy prior to [redacted] weeks gestation; Hyperemesis gravidarum; Hyperemesis gravidarum, antepartum; Adjustment disorder with anxiety; Ptyalism; and Substance abuse affecting pregnancy, antepartum on their problem list.  ----------------------------------------------------------------------------------- Patient reports no complaints.   Contractions: Not present. Vag. Bleeding: None.  Movement: Present. Denies leaking of fluid.  ----------------------------------------------------------------------------------- The following portions of the patient's history were reviewed and updated as appropriate: allergies, current medications, past family history, past medical history, past social history, past surgical history and problem list. Problem list updated.   Objective  Blood pressure 120/78, weight 155 lb (70.3 kg), last menstrual period 06/29/2017. Pregravid weight 140 lb (63.5 kg) Total Weight Gain 15 lb (6.804 kg) Urinalysis:      Fetal Status: Fetal Heart Rate (bpm): 158   Movement: Present     Follow up anatomy scan today: anatomy scan is now complete/normal  General:  Alert, oriented and cooperative. Patient is in no acute distress.  Skin: Skin is warm and dry. No rash noted.   Cardiovascular: Normal heart rate noted  Respiratory: Normal respiratory effort, no problems with respiration noted  Abdomen: Soft, gravid, appropriate for gestational age. Pain/Pressure: Absent     Pelvic:  Cervical exam deferred        Extremities: Normal range of motion.  Edema: None  Mental Status: Normal mood and affect. Normal behavior.  Normal judgment and thought content.   Assessment   27 y.o. [id number] at 7075w4d by  04/10/2018, by Ultrasound presenting for routine prenatal visit  Plan   fourth Problems (from 08/16/17 to present)    Problem Noted Resolved   Supervision of other normal pregnancy, antepartum 08/16/2017 by Oswaldo ConroySchmid, Jacelyn Y, CNM No   Overview Addendum 09/14/2017  9:18 AM by Oswaldo ConroySchmid, Jacelyn Y, CNM    Clinic [facility] Prenatal Labs  Dating  Blood type: --/--/A POS (11/24 1248)   Genetic Screen 1 Screen:    AFP:     Quad:     NIPS: Antibody:Negative (11/20 1009)  Anatomic US  Rubella: 2.16 (11/20 1009) Varicella:    GTT Early:               Third trimester:  RPR: Non Reactive (11/20 1009)   Rhogam  HBsAg: Negative (11/20 1009)   TDaP vaccine                       Flu Shot: HIV:     Baby Food                                GBS:   Contraception  Pap:  CBB     CS/VBAC    Support Person                  Preterm labor symptoms and general obstetric precautions including but not limited to vaginal bleeding, contractions, leaking of fluid and fetal movement were reviewed in detail with the patient. Please refer to After Visit Summary for other counseling recommendations.   Return in about 1 month (around 01/20/2018) for 28 week labs/rob.  Dominique Shea, CNM 12/23/2017 10:45 AM

## 2017-12-23 NOTE — Progress Notes (Signed)
ROB F/u anatomy scan/ Its a GIRL! Pt has no concerns

## 2017-12-30 ENCOUNTER — Other Ambulatory Visit: Payer: Self-pay

## 2017-12-30 ENCOUNTER — Telehealth: Payer: Self-pay | Admitting: Oncology

## 2017-12-30 ENCOUNTER — Inpatient Hospital Stay: Payer: Medicaid Other | Attending: Oncology | Admitting: Oncology

## 2017-12-30 ENCOUNTER — Inpatient Hospital Stay: Payer: Medicaid Other

## 2017-12-30 DIAGNOSIS — D696 Thrombocytopenia, unspecified: Secondary | ICD-10-CM

## 2017-12-30 NOTE — Telephone Encounter (Signed)
Called patient to rschd [facility] appts for Labs and MD.  L/M on patient's V/M to call for rschd of appts.

## 2018-01-09 ENCOUNTER — Encounter: Payer: Medicaid Other | Admitting: Obstetrics and Gynecology

## 2018-01-11 ENCOUNTER — Telehealth: Payer: Self-pay | Admitting: Oncology

## 2018-01-11 NOTE — Telephone Encounter (Signed)
Labs/MD on 01/20/18 @ 1:15/1:45, per Sherry/schd msg. Appts mailed as requested. MF

## 2018-01-14 ENCOUNTER — Inpatient Hospital Stay (HOSPITAL_COMMUNITY)
Admission: AD | Admit: 2018-01-14 | Discharge: 2018-01-21 | DRG: 806 | Discharge status: Against Medical Advice | Payer: Medicaid Other | Source: Ambulatory Visit | Attending: Obstetrics and Gynecology | Admitting: Obstetrics and Gynecology

## 2018-01-14 ENCOUNTER — Other Ambulatory Visit: Payer: Self-pay

## 2018-01-14 ENCOUNTER — Observation Stay
Admission: EM | Admit: 2018-01-14 | Discharge: 2018-01-14 | Disposition: A | Discharge status: Other Acute Inpatient Hospital | Payer: Medicaid Other | Attending: Obstetrics and Gynecology | Admitting: Obstetrics and Gynecology

## 2018-01-14 DIAGNOSIS — O1425 HELLP syndrome, complicating the puerperium: Secondary | ICD-10-CM | POA: Diagnosis not present

## 2018-01-14 DIAGNOSIS — O99332 Smoking (tobacco) complicating pregnancy, second trimester: Secondary | ICD-10-CM | POA: Insufficient documentation

## 2018-01-14 DIAGNOSIS — O9912 Other diseases of the blood and blood-forming organs and certain disorders involving the immune mechanism complicating childbirth: Secondary | ICD-10-CM | POA: Diagnosis present

## 2018-01-14 DIAGNOSIS — O1412 Severe pre-eclampsia, second trimester: Principal | ICD-10-CM | POA: Insufficient documentation

## 2018-01-14 DIAGNOSIS — D693 Immune thrombocytopenic purpura: Secondary | ICD-10-CM | POA: Diagnosis present

## 2018-01-14 DIAGNOSIS — Z3480 Encounter for supervision of other normal pregnancy, unspecified trimester: Secondary | ICD-10-CM

## 2018-01-14 DIAGNOSIS — O99119 Other diseases of the blood and blood-forming organs and certain disorders involving the immune mechanism complicating pregnancy, unspecified trimester: Secondary | ICD-10-CM

## 2018-01-14 DIAGNOSIS — O99320 Drug use complicating pregnancy, unspecified trimester: Secondary | ICD-10-CM

## 2018-01-14 DIAGNOSIS — Z3A28 28 weeks gestation of pregnancy: Secondary | ICD-10-CM

## 2018-01-14 DIAGNOSIS — R51 Headache: Secondary | ICD-10-CM | POA: Diagnosis present

## 2018-01-14 DIAGNOSIS — O1420 HELLP syndrome (HELLP), unspecified trimester: Secondary | ICD-10-CM

## 2018-01-14 DIAGNOSIS — O99334 Smoking (tobacco) complicating childbirth: Secondary | ICD-10-CM | POA: Diagnosis present

## 2018-01-14 DIAGNOSIS — F17210 Nicotine dependence, cigarettes, uncomplicated: Secondary | ICD-10-CM | POA: Diagnosis present

## 2018-01-14 DIAGNOSIS — Z3009 Encounter for other general counseling and advice on contraception: Secondary | ICD-10-CM | POA: Diagnosis present

## 2018-01-14 DIAGNOSIS — O99324 Drug use complicating childbirth: Secondary | ICD-10-CM | POA: Diagnosis present

## 2018-01-14 DIAGNOSIS — Z3A27 27 weeks gestation of pregnancy: Secondary | ICD-10-CM | POA: Diagnosis not present

## 2018-01-14 DIAGNOSIS — F1220 Cannabis dependence, uncomplicated: Secondary | ICD-10-CM | POA: Diagnosis not present

## 2018-01-14 DIAGNOSIS — D696 Thrombocytopenia, unspecified: Secondary | ICD-10-CM | POA: Diagnosis not present

## 2018-01-14 DIAGNOSIS — O1424 HELLP syndrome, complicating childbirth: Principal | ICD-10-CM | POA: Diagnosis present

## 2018-01-14 DIAGNOSIS — O26892 Other specified pregnancy related conditions, second trimester: Secondary | ICD-10-CM | POA: Insufficient documentation

## 2018-01-14 DIAGNOSIS — F1290 Cannabis use, unspecified, uncomplicated: Secondary | ICD-10-CM | POA: Diagnosis present

## 2018-01-14 DIAGNOSIS — O162 Unspecified maternal hypertension, second trimester: Secondary | ICD-10-CM | POA: Diagnosis present

## 2018-01-14 LAB — COMPREHENSIVE METABOLIC PANEL
ALT: 24 U/L (ref 14–54)
ALT: 29 U/L (ref 14–54)
ANION GAP: 6 (ref 5–15)
ANION GAP: 11 (ref 5–15)
AST: 50 U/L — ABNORMAL HIGH (ref 15–41)
AST: 55 U/L — ABNORMAL HIGH (ref 15–41)
Albumin: 2.9 g/dL — ABNORMAL LOW (ref 3.5–5.0)
Albumin: 2.8 g/dL — ABNORMAL LOW (ref 3.5–5.0)
Alkaline Phosphatase: 82 U/L (ref 38–126)
Alkaline Phosphatase: 90 U/L (ref 38–126)
BUN: 14 mg/dL (ref 6–20)
BUN: 14 mg/dL (ref 6–20)
CALCIUM: 8.1 mg/dL — AB (ref 8.9–10.3)
CHLORIDE: 112 mmol/L — AB (ref 101–111)
CO2: 19 mmol/L — ABNORMAL LOW (ref 22–32)
CO2: 15 mmol/L — ABNORMAL LOW (ref 22–32)
Calcium: 8.2 mg/dL — ABNORMAL LOW (ref 8.9–10.3)
Chloride: 111 mmol/L (ref 101–111)
Creatinine, Ser: 0.97 mg/dL (ref 0.44–1.00)
Creatinine, Ser: 0.94 mg/dL (ref 0.44–1.00)
GFR calc Af Amer: >60 mL/min (ref >60)
Glucose, Bld: 85 mg/dL (ref 65–99)
Glucose, Bld: 108 mg/dL — ABNORMAL HIGH (ref 65–99)
POTASSIUM: 3.4 mmol/L — AB (ref 3.5–5.1)
POTASSIUM: 3.3 mmol/L — AB (ref 3.5–5.1)
Sodium: 136 mmol/L (ref 135–145)
Sodium: 138 mmol/L (ref 135–145)
TOTAL PROTEIN: 5.9 g/dL — AB (ref 6.5–8.1)
Total Bilirubin: 1.5 mg/dL — ABNORMAL HIGH (ref 0.3–1.2)
Total Bilirubin: 1.6 mg/dL — ABNORMAL HIGH (ref 0.3–1.2)
Total Protein: 5.7 g/dL — ABNORMAL LOW (ref 6.5–8.1)

## 2018-01-14 LAB — CBC
HCT: 40.4 % (ref 35.0–47.0)
HCT: 37.6 % (ref 36.0–46.0)
Hemoglobin: 14.2 g/dL (ref 12.0–16.0)
Hemoglobin: 13.6 g/dL (ref 12.0–15.0)
MCH: 32.9 pg (ref 26.0–34.0)
MCH: 32.2 pg (ref 26.0–34.0)
MCHC: 35.2 g/dL (ref 32.0–36.0)
MCHC: 36.2 g/dL — ABNORMAL HIGH (ref 30.0–36.0)
MCV: 93.3 fL (ref 80.0–100.0)
MCV: 89.1 fL (ref 78.0–100.0)
PLATELETS: 63 10*3/uL — AB (ref 150–400)
Platelets: 53 10*3/uL — ABNORMAL LOW (ref 150–440)
RBC: 4.33 MIL/uL (ref 3.80–5.20)
RBC: 4.22 MIL/uL (ref 3.87–5.11)
RDW: 13.5 % (ref 11.5–14.5)
RDW: 13.3 % (ref 11.5–15.5)
WBC: 14.1 10*3/uL — ABNORMAL HIGH (ref 3.6–11.0)
WBC: 18.4 10*3/uL — ABNORMAL HIGH (ref 4.0–10.5)

## 2018-01-14 LAB — DIC (DISSEMINATED INTRAVASCULAR COAGULATION) PANEL
APTT: 33 s (ref 24–36)
D DIMER QUANT: 5.55 ug{FEU}/mL — AB (ref 0.00–0.50)
FIBRINOGEN: 373 mg/dL (ref 210–475)
INR: 0.92
PROTHROMBIN TIME: 12.3 s (ref 11.4–15.2)

## 2018-01-14 LAB — TYPE AND SCREEN
ABO/RH(D): A POS
ANTIBODY SCREEN: NEGATIVE

## 2018-01-14 LAB — URINE DRUG SCREEN, QUALITATIVE (ARMC ONLY)
Amphetamines, Ur Screen: NOT DETECTED
BARBITURATES, UR SCREEN: NOT DETECTED
Benzodiazepine, Ur Scrn: NOT DETECTED
COCAINE METABOLITE, UR ~~LOC~~: NOT DETECTED
Cannabinoid 50 Ng, Ur ~~LOC~~: POSITIVE — AB
MDMA (Ecstasy)Ur Screen: NOT DETECTED
METHADONE SCREEN, URINE: NOT DETECTED
OPIATE, UR SCREEN: NOT DETECTED
PHENCYCLIDINE (PCP) UR S: NOT DETECTED
Tricyclic, Ur Screen: NOT DETECTED

## 2018-01-14 LAB — PROTEIN / CREATININE RATIO, URINE
CREATININE, URINE: 53 mg/dL
Protein Creatinine Ratio: 15.58 mg/mg{Cre} — ABNORMAL HIGH (ref 0.00–0.15)
Total Protein, Urine: 826 mg/dL

## 2018-01-14 LAB — DIC (DISSEMINATED INTRAVASCULAR COAGULATION)PANEL
Platelets: 63 10*3/uL — ABNORMAL LOW (ref 150–400)
Smear Review: NONE SEEN

## 2018-01-14 MED ORDER — ACETAMINOPHEN 325 MG PO TABS: 650.0000 mg | ORAL_TABLET | ORAL | Status: DC | PRN

## 2018-01-14 MED ORDER — NIFEDIPINE 10 MG PO CAPS
ORAL_CAPSULE | ORAL | Status: AC
  Filled 2018-01-14: qty 1

## 2018-01-14 MED ORDER — LABETALOL HCL 5 MG/ML IV SOLN
20.0000 mg | INTRAVENOUS | Status: DC | PRN
  Filled 2018-01-14: qty 4

## 2018-01-14 MED ORDER — LABETALOL HCL 200 MG PO TABS
200.0000 mg | ORAL_TABLET | Freq: Three times a day (TID) | ORAL | Status: DC
  Administered 2018-01-15 – 2018-01-16 (×3): 200 mg via ORAL
  Filled 2018-01-14 (×4): qty 1

## 2018-01-14 MED ORDER — ZOLPIDEM TARTRATE 5 MG PO TABS
5.0000 mg | ORAL_TABLET | Freq: Every evening | ORAL | Status: DC | PRN
  Administered 2018-01-14: 5 mg via ORAL
  Filled 2018-01-14: qty 1

## 2018-01-14 MED ORDER — MAGNESIUM SULFATE BOLUS VIA INFUSION
4.0000 g | Freq: Once | INTRAVENOUS | Status: AC
  Administered 2018-01-14: 4 g via INTRAVENOUS
  Filled 2018-01-14: qty 500

## 2018-01-14 MED ORDER — LIDOCAINE HCL URETHRAL/MUCOSAL 2 % EX GEL
1.0000 "application " | Freq: Once | CUTANEOUS | Status: DC
  Filled 2018-01-14: qty 5

## 2018-01-14 MED ORDER — MORPHINE SULFATE (PF) 2 MG/ML IV SOLN
1.0000 mg | INTRAVENOUS | Status: DC | PRN
  Administered 2018-01-14: 1 mg via INTRAVENOUS

## 2018-01-14 MED ORDER — HYDRALAZINE HCL 20 MG/ML IJ SOLN: 5.0000 mg | INTRAMUSCULAR | Status: DC | PRN

## 2018-01-14 MED ORDER — MORPHINE SULFATE (PF) 2 MG/ML IV SOLN
INTRAVENOUS | Status: AC
  Filled 2018-01-14: qty 1

## 2018-01-14 MED ORDER — OXYCODONE-ACETAMINOPHEN 5-325 MG PO TABS
1.0000 | ORAL_TABLET | ORAL | Status: DC | PRN
  Administered 2018-01-14 – 2018-01-15 (×2): 1 via ORAL
  Filled 2018-01-14 (×2): qty 1

## 2018-01-14 MED ORDER — MAGNESIUM SULFATE 40 G IN LACTATED RINGERS - SIMPLE
2.0000 g/h | INTRAVENOUS | Status: DC
  Administered 2018-01-15 – 2018-01-17 (×3): 2 g/h via INTRAVENOUS
  Filled 2018-01-14 (×3): qty 40
  Filled 2018-01-14: qty 500

## 2018-01-14 MED ORDER — CALCIUM GLUCONATE 10 % IV SOLN
INTRAVENOUS | Status: AC
  Filled 2018-01-14: qty 10

## 2018-01-14 MED ORDER — ONDANSETRON HCL 4 MG/2ML IJ SOLN
4.0000 mg | Freq: Four times a day (QID) | INTRAMUSCULAR | Status: DC | PRN
  Administered 2018-01-14 – 2018-01-15 (×2): 4 mg via INTRAVENOUS
  Filled 2018-01-14 (×2): qty 2

## 2018-01-14 MED ORDER — DOCUSATE SODIUM 100 MG PO CAPS
100.0000 mg | ORAL_CAPSULE | Freq: Every day | ORAL | Status: DC
  Administered 2018-01-14 – 2018-01-15 (×2): 100 mg via ORAL
  Filled 2018-01-14 (×2): qty 1

## 2018-01-14 MED ORDER — LABETALOL HCL 5 MG/ML IV SOLN
20.0000 mg | INTRAVENOUS | Status: AC | PRN
  Administered 2018-01-14: 20 mg via INTRAVENOUS
  Administered 2018-01-14 (×2): 40 mg via INTRAVENOUS
  Filled 2018-01-14 (×2): qty 4
  Filled 2018-01-14: qty 8

## 2018-01-14 MED ORDER — CALCIUM CARBONATE ANTACID 500 MG PO CHEW: 2.0000 | CHEWABLE_TABLET | ORAL | Status: DC | PRN

## 2018-01-14 MED ORDER — LACTATED RINGERS IV SOLN
INTRAVENOUS | Status: DC
  Administered 2018-01-14 – 2018-01-15 (×4): via INTRAVENOUS

## 2018-01-14 MED ORDER — MAGNESIUM SULFATE 40 G IN LACTATED RINGERS - SIMPLE
2.0000 g/h | INTRAVENOUS | Status: DC
  Administered 2018-01-14: 2 g/h via INTRAVENOUS
  Filled 2018-01-14: qty 40
  Filled 2018-01-14: qty 500

## 2018-01-14 MED ORDER — LABETALOL HCL 200 MG PO TABS
400.0000 mg | ORAL_TABLET | Freq: Three times a day (TID) | ORAL | Status: DC
  Administered 2018-01-14: 400 mg via ORAL
  Filled 2018-01-14: qty 2

## 2018-01-14 MED ORDER — PRENATAL MULTIVITAMIN CH
1.0000 | ORAL_TABLET | Freq: Every day | ORAL | Status: DC
  Administered 2018-01-15: 1 via ORAL
  Filled 2018-01-14: qty 1

## 2018-01-14 MED ORDER — BETAMETHASONE SOD PHOS & ACET 6 (3-3) MG/ML IJ SUSP
12.0000 mg | INTRAMUSCULAR | Status: DC
  Administered 2018-01-15: 12 mg via INTRAMUSCULAR
  Filled 2018-01-14: qty 2

## 2018-01-14 MED ORDER — BETAMETHASONE SOD PHOS & ACET 6 (3-3) MG/ML IJ SUSP
12.0000 mg | Freq: Once | INTRAMUSCULAR | Status: AC
  Administered 2018-01-14: 6 mg via INTRAMUSCULAR

## 2018-01-14 MED ORDER — BETAMETHASONE SOD PHOS & ACET 6 (3-3) MG/ML IJ SUSP
INTRAMUSCULAR | Status: AC
  Administered 2018-01-14: 6 mg via INTRAMUSCULAR
  Filled 2018-01-14: qty 1

## 2018-01-14 MED ORDER — HYDRALAZINE HCL 20 MG/ML IJ SOLN
10.0000 mg | Freq: Once | INTRAMUSCULAR | Status: AC | PRN
  Administered 2018-01-14: 10 mg via INTRAVENOUS
  Filled 2018-01-14: qty 1

## 2018-01-14 MED ORDER — POTASSIUM CHLORIDE CRYS ER 20 MEQ PO TBCR
40.0000 meq | EXTENDED_RELEASE_TABLET | Freq: Two times a day (BID) | ORAL | Status: DC
  Administered 2018-01-15 (×3): 40 meq via ORAL
  Filled 2018-01-14 (×6): qty 2

## 2018-01-14 NOTE — H&P (Signed)
History and Physical  Dominique Shea is an 27 y.o. female.  HPI: Patient presents today complaining of a persistent headache for 3 days and pain in her epigastric area and right side which started several hours ago. She tried tylenol for her headache but it did not help.  She has had a difficult pregnancy complicated with severe nausea and vomiting and lower extremity swelling. She reports + fetal movement. She denies leakage of fluid. She denies vaginal bleeding. She denies vision changes.    OB History  Gravida Para Term Preterm AB Living  4 2 2   1 2   SAB TAB Ectopic Multiple Live Births  1       2    # Outcome Date GA Lbr Len/2nd Weight Sex Delivery Anes PTL Lv  4 Current           3 Term 05/17/13    F Vag-Spont   LIV  2 Term 09/23/11   7 lb (3.175 kg) F Vag-Spont  N LIV     Birth Comments: cholecystectomy during preg, thrombocytopenia  1 SAB 2011           She denies a history of gestational diabetes, gestational hypertension, or preeclampsia She reports that this pregnancy is by a different father than her previous two pregnancies She denies any complications with her vaginal deliveries such as hemorrhage, shoulder dystocia or 4th degree vaginal lacerations.   Past Medical History:  Diagnosis Date  . 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 History of gonorrhea 2014  . Thrombocytopenia (HCC)    DURING FIRST PREG    Past Surgical History:  Procedure Laterality Date  . CHOLECYSTECTOMY  2012   [facility]  . FRACTURE SURGERY      Family History  Problem Relation Age of Onset  . Hypertension Mother   . Migraines Mother   . Hypertension Father   . Migraines Father   . Migraines Sister   . Migraines Brother     Social History:  reports that she has been smoking cigarettes.  She has a 4.00 pack-year smoking history. She has never used smokeless tobacco. She reports that she drinks alcohol. She reports that she has current or past drug history. Drug:  Marijuana.  Allergies:  Allergies  Allergen Reactions  . Peanut-Containing Drug Products Anaphylaxis  . Tomato Anaphylaxis  . Benadryl [Diphenhydramine]     Pt reports throat swelling  . Doxycycline     Pt had facial swelling after receiving ms04, zofran, hydrocodone, tylenol, ivp dye, and doxycline. Unclear which was responsible agent.   . Ibuprofen Swelling  . Ivp Dye [Iodinated Diagnostic Agents]     Pt had facial swelling after receiving ms04, zofran, hydrocodone, tylenol, ivp dye, and doxycline. Unclear which was responsible agent.     Medications: I have reviewed the patient's current medications.  Results for orders placed or performed during the hospital encounter of 01/14/18 (from the past 48 hour(s))  Type and screen [facility]     Status: None (Preliminary result)   Collection Time: 01/14/18  6:39 PM  Result Value Ref Range   ABO/RH(D) PENDING    Antibody Screen PENDING    Sample Expiration      01/17/2018 Performed at [facility], [address], [place], [place] [address]     No results found.  Review of Systems  Constitutional: Negative for chills, fever, malaise/fatigue and weight loss.  HENT: Negative for congestion, hearing loss  and sinus pain.   Eyes: Negative for blurred vision and double vision.  Respiratory: Negative for cough, sputum production, shortness of breath and wheezing.   Cardiovascular: Negative for chest pain, palpitations and orthopnea.  Gastrointestinal: Positive for abdominal pain and heartburn. Negative for constipation, diarrhea, nausea and vomiting.  Genitourinary: Negative for dysuria, flank pain, frequency, hematuria and urgency.  Musculoskeletal: Negative for back pain, falls and joint pain.  Skin: Negative for itching and rash.  Neurological: Positive for headaches. Negative for dizziness.  Psychiatric/Behavioral: Negative for depression, substance abuse and suicidal ideas. The patient is not  nervous/anxious.    Blood pressure (!) 190/176, pulse 61, temperature 98.3 F (36.8 C), temperature source Oral, last menstrual period 06/29/2017, SpO2 100 %. Physical Exam  Nursing note and vitals reviewed. Constitutional: She is oriented to person, place, and time. She appears well-developed and well-nourished.  HENT:  Head: Normocephalic and atraumatic.  Cardiovascular: Normal rate and regular rhythm.  Respiratory: Effort normal and breath sounds normal.  GI: Soft. Bowel sounds are normal.  Musculoskeletal: Normal range of motion. She exhibits edema.  2+ edema of bilateral lower extremities   Neurological: She is alert and oriented to person, place, and time.  Skin: Skin is warm and dry.  Psychiatric: She has a normal mood and affect. Her behavior is normal. Judgment and thought content normal.   NST: 130s moderate variability, no decelerations, more than 2 10 x 10 accelerations.  Toco: Quiet  Assessment/Plan: 26yo [id number] at 27 weeks 5 day gestational age 70. Severe preeclampsia- patient started on magnesium and antihypertensive protocols. Will send CMP, CBC, type and screen and urine protein creatinine ration. UA in triage showed 4+ protein. 2.  Premature Gestational Age - will give betamethasone and transfer to tertiary care center once blood pressures are stabilized.    Christanna R Schuman 01/14/2018, 7:09 PM

## 2018-01-14 NOTE — Progress Notes (Signed)
Patient's laboratory data is consistent with HELLP. [facility] approximately 19:15. Dr. Adrian BlackwaterStinson accepting physician.  Adelene Idlerhristanna Aliesha Dolata MD [facility], [facility] 01/14/18 8:17 PM

## 2018-01-14 NOTE — H&P (Signed)
Faculty Practice Antenatal History and Physical  Dominique Shea:[id number] DOB: 19-Jun-1991 DOA: 01/14/2018  Chief Complaint: Headache, RUQ pain x3 days  HPI: Dominique Shea is a 27 y.o. female [phone] with IUP at [redacted]w[redacted]d (dated by [redacted]w[redacted]d [place] with EDC of 04/10/18) who was transferred to our facility from [facility] due to preeclampsia with severe features and possible HELLP syndrome. She is being followed by [facility] for prenatal care and started receiving care at approximately [redacted] weeks gestation. 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 She was referred to Heme/Onc and she has had serial CBCs to monitor platelet count. This pregnancy, the lowest her platelets have been were 79,000, but her last CBC on 11/03/17 was 108,000.  She presents to the [facility] with headache and RUQ pain for 3 days. Tylenol not helpful. Pain increasing. At [facility], her BP was 196/96. Her platelets were 50,000. Magnesium, BMZ were initiated, labetalol and hydralazine given. Patient transferred here for continued care.   Review of Systems:   Pt complains of HA, RUQ pain. Edema in legs bilaterally. Mild SOB, vomited here.  Pt denies any fevers, chest pain, diarrhea.  Review of systems are otherwise negative  Prenatal History/Complications: ITP  Past Medical History: Past Medical History:  Diagnosis Date  . 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 History of gonorrhea 2014  . Thrombocytopenia (HCC)    DURING FIRST PREG    Past Surgical History: Past Surgical History:  Procedure Laterality Date  . CHOLECYSTECTOMY  2012   [facility]  . FRACTURE SURGERY      Obstetrical History: OB History    Gravida  4   Para  2   Term  2   Preterm      AB  1   Living  2     SAB  1   TAB      Ectopic      Multiple      Live Births  2           Gynecological History: OB History    Gravida  4   Para  2   Term  2   Preterm      AB  1   Living  2     SAB  1   TAB      Ectopic      Multiple       Live Births  2           Social History: Social History   Socioeconomic History  . Marital status: Single    Spouse name: Not on file  . Number of children: 2  . Years of education: GED  . Highest education level: Not on file  Occupational History  . Not on file  Social Needs  . Financial resource strain: Not on file  . Food insecurity:    Worry: Not on file    Inability: Not on file  . Transportation needs:    Medical: Not on file    Non-medical: Not on file  Tobacco Use  . Smoking status: Current Every Day Smoker    Packs/day: 1.00    Years: 4.00    Pack years: 4.00    Types: Cigarettes  . Smokeless tobacco: Never Used  . Tobacco comment: stopped when found out she was pregnant  Substance and Sexual Activity  . Alcohol use: Yes    Comment: occ  . Drug use: Yes    Types: Marijuana  .  Sexual activity: Yes    Birth control/protection: None  Lifestyle  . Physical activity:    Days per week: Not on file    Minutes per session: Not on file  . Stress: Not on file  Relationships  . Social connections:    Talks on phone: Not on file    Gets together: Not on file    Attends religious service: Not on file    Active member of club or organization: Not on file    Attends meetings of clubs or organizations: Not on file    Relationship status: Not on file  Other Topics Concern  . Not on file  Social History Narrative  . Not on file    Family History: Family History  Problem Relation Age of Onset  . Hypertension Mother   . Migraines Mother   . Hypertension Father   . Migraines Father   . Migraines Sister   . Migraines Brother     Allergies: Allergies  Allergen Reactions  . Peanut-Containing Drug Products Anaphylaxis  . Tomato Anaphylaxis  . Benadryl [Diphenhydramine]     Pt reports throat swelling  . Doxycycline     Pt had facial swelling after receiving ms04, zofran, hydrocodone, tylenol, ivp dye, and doxycline. Unclear which was responsible agent.    . Ibuprofen Swelling  . Ivp Dye [Iodinated Diagnostic Agents]     Pt had facial swelling after receiving ms04, zofran, hydrocodone, tylenol, ivp dye, and doxycline. Unclear which was responsible agent.     Medications Prior to Admission  Medication Sig Dispense Refill Last Dose  . cyclobenzaprine (FLEXERIL) 10 MG tablet Take 1 tablet (10 mg total) by mouth 3 (three) times daily as needed for muscle spasms. (Patient not taking: Reported on 01/14/2018) 30 tablet 2 Not Taking at Unknown time  . Doxylamine-Pyridoxine (DICLEGIS) 10-10 MG TBEC Take 2 tablets by mouth at bedtime. (Patient not taking: Reported on 11/03/2017) 60 tablet 0 Not Taking  . famotidine (PEPCID) 20 MG tablet Take 1 tablet (20 mg total) by mouth 2 (two) times daily. (Patient not taking: Reported on 11/03/2017) 60 tablet 1 Not Taking  . metoCLOPramide (REGLAN) 10 MG tablet Take 0.5 tablet tid before each meal and at hs (Patient not taking: Reported on 11/03/2017) 60 tablet 1 Not Taking  . metroNIDAZOLE (FLAGYL) 500 MG tablet Take 1 tablet (500 mg total) by mouth 2 (two) times daily. (Patient not taking: Reported on 11/03/2017) 14 tablet 0 Not Taking  . Prenatal Vit-Fe Fumarate-FA (MULTIVITAMIN-PRENATAL) 27-0.8 MG TABS tablet Take 1 tablet by mouth daily at 12 noon.   01/13/2018 at Unknown time    Physical Exam: LMP 06/29/2017   Temp 98.1 F (36.7 C) (Oral)   LMP 06/29/2017  General appearance: alert, cooperative and no distress Head: Normocephalic, without obvious abnormality, atraumatic Eyes: conjunctivae/corneas clear. PERRL, EOM's intact. Fundi benign. Swelling in face Lungs: clear to auscultation bilaterally Heart: regular rate and rhythm, S1, S2 normal, no murmur, click, rub or gallop Abdomen: soft, tender in RUQ and epigastric area Extremities: extremities normal, atraumatic, no cyanosis or edema, edema 2+ pitting and Homans sign is negative, no sign of DVT Pulses: 2+ and symmetric Skin: Skin color, texture, turgor normal.  No rashes or lesions Lymph nodes: Cervical, supraclavicular, and axillary nodes normal. Neurologic: Grossly intact. DTRs 3+ cephalic Baseline: 130 bpm, Variability: Good {> 6 bpm), Accelerations: Reactive and Decelerations: Absent None             Labs on Admission:  Basic Metabolic Panel:  Recent Labs  Lab 01/14/18 1849  NA 136  K 3.4*  CL 111  CO2 19*  GLUCOSE 85  BUN 14  CREATININE 0.97  CALCIUM 8.1*   Liver Function Tests: Recent Labs  Lab 01/14/18 1849  AST 50*  ALT 24  ALKPHOS 82  BILITOT 1.5*  PROT 5.9*  ALBUMIN 2.9*   No results for input(s): LIPASE, AMYLASE in the last 168 hours. No results for input(s): AMMONIA in the last 168 hours. CBC: Recent Labs  Lab 01/14/18 1849  WBC 14.1*  HGB 14.2  HCT 40.4  MCV 93.3  PLT 53*    CBG: No results for input(s): GLUCAP in the last 168 hours.  Radiological Exams on Admission: No results found.   Assessment/Plan Active Problems:   Thrombocytopenia (HCC)   Substance abuse affecting pregnancy, antepartum   Severe preeclampsia, second trimester   [redacted] weeks gestation of pregnancy   Unwanted fertility  1. Severe Preeclampsia 1. ? HELLP 2. Repeat CBC, CMP, DIC now 3. Repeat CBC, CMP tomorrow AM 4. Continue Magnesium 2g/hr 5. NICU consult 6. BMZ administered at 1900 at [facility]. Rpt tomorrow, possibly at 12hrs if need to expedite delivery 7. Start labetalol 400mg  TID 8. Hydralazine protocol now 9. [place] tomorrow morning 2. Thrombocytopenia 1. ? Rpt platelets now and tomorrow AM 2. May improve due to steroid effect 3. Substance abuse in pregnancy 1. THC in urine 4. Unwanted fertility 1. BTL papers signed here today 5. [redacted] wks gestation 1. NST reasuring   Time spent: 80 min  Levie Heritage, DO 01/14/2018 9:40 PM Faculty Practice Attending Physician [facility] Attending Phone #: [phone]

## 2018-01-14 NOTE — OB Triage Note (Signed)
Patient here for headache and abdominal pain for last 3 days, pain is uppper mid epigastric patient rates these pains 9 of ten . Decreased  Fetal movement for last 12 hours. Patient has not had any food or water today.

## 2018-01-15 ENCOUNTER — Encounter (HOSPITAL_COMMUNITY): Payer: Self-pay

## 2018-01-15 ENCOUNTER — Inpatient Hospital Stay (HOSPITAL_COMMUNITY): Payer: Medicaid Other

## 2018-01-15 LAB — COMPREHENSIVE METABOLIC PANEL
ALBUMIN: 2.7 g/dL — AB (ref 3.5–5.0)
ALK PHOS: 81 U/L (ref 38–126)
ALK PHOS: 80 U/L (ref 38–126)
ALT: 26 U/L (ref 14–54)
ALT: 23 U/L (ref 14–54)
ANION GAP: 10 (ref 5–15)
AST: 49 U/L — ABNORMAL HIGH (ref 15–41)
AST: 35 U/L (ref 15–41)
Albumin: 2.6 g/dL — ABNORMAL LOW (ref 3.5–5.0)
Anion gap: 11 (ref 5–15)
BILIRUBIN TOTAL: 0.9 mg/dL (ref 0.3–1.2)
BUN: 16 mg/dL (ref 6–20)
BUN: 17 mg/dL (ref 6–20)
CALCIUM: 7.7 mg/dL — AB (ref 8.9–10.3)
CALCIUM: 7.3 mg/dL — AB (ref 8.9–10.3)
CO2: 16 mmol/L — ABNORMAL LOW (ref 22–32)
CO2: 16 mmol/L — AB (ref 22–32)
CREATININE: 1.14 mg/dL — AB (ref 0.44–1.00)
Chloride: 108 mmol/L (ref 101–111)
Chloride: 107 mmol/L (ref 101–111)
Creatinine, Ser: 1.07 mg/dL — ABNORMAL HIGH (ref 0.44–1.00)
GFR calc Af Amer: >60 mL/min (ref >60)
GFR calc non Af Amer: >60 mL/min (ref >60)
GLUCOSE: 124 mg/dL — AB (ref 65–99)
Glucose, Bld: 128 mg/dL — ABNORMAL HIGH (ref 65–99)
Potassium: 3.8 mmol/L (ref 3.5–5.1)
Potassium: 4 mmol/L (ref 3.5–5.1)
SODIUM: 133 mmol/L — AB (ref 135–145)
Sodium: 135 mmol/L (ref 135–145)
TOTAL PROTEIN: 5.7 g/dL — AB (ref 6.5–8.1)
Total Bilirubin: 1.1 mg/dL (ref 0.3–1.2)
Total Protein: 5.6 g/dL — ABNORMAL LOW (ref 6.5–8.1)

## 2018-01-15 LAB — CBC
HCT: 36.3 % (ref 36.0–46.0)
HEMATOCRIT: 36.7 % (ref 36.0–46.0)
HEMOGLOBIN: 13 g/dL (ref 12.0–15.0)
HEMOGLOBIN: 13 g/dL (ref 12.0–15.0)
MCH: 31.9 pg (ref 26.0–34.0)
MCH: 32.2 pg (ref 26.0–34.0)
MCHC: 35.4 g/dL (ref 30.0–36.0)
MCHC: 35.8 g/dL (ref 30.0–36.0)
MCV: 90 fL (ref 78.0–100.0)
MCV: 89.9 fL (ref 78.0–100.0)
Platelets: 57 10*3/uL — ABNORMAL LOW (ref 150–400)
Platelets: 64 10*3/uL — ABNORMAL LOW (ref 150–400)
RBC: 4.08 MIL/uL (ref 3.87–5.11)
RBC: 4.04 MIL/uL (ref 3.87–5.11)
RDW: 13.5 % (ref 11.5–15.5)
RDW: 13.6 % (ref 11.5–15.5)
WBC: 16.1 10*3/uL — ABNORMAL HIGH (ref 4.0–10.5)
WBC: 18.6 10*3/uL — ABNORMAL HIGH (ref 4.0–10.5)

## 2018-01-15 MED ORDER — SOD CITRATE-CITRIC ACID 500-334 MG/5ML PO SOLN: 30.0000 mL | ORAL | Status: DC | PRN

## 2018-01-15 MED ORDER — MISOPROSTOL 50MCG HALF TABLET
50.0000 ug | ORAL_TABLET | Freq: Once | ORAL | Status: AC
  Administered 2018-01-15: 50 ug via ORAL
  Filled 2018-01-15: qty 1

## 2018-01-15 MED ORDER — OXYTOCIN 40 UNITS IN LACTATED RINGERS INFUSION - SIMPLE MED
1.0000 m[IU]/min | INTRAVENOUS | Status: DC
  Administered 2018-01-15: 1 m[IU]/min via INTRAVENOUS

## 2018-01-15 MED ORDER — OXYCODONE-ACETAMINOPHEN 5-325 MG PO TABS: 1.0000 | ORAL_TABLET | ORAL | Status: DC | PRN

## 2018-01-15 MED ORDER — FLEET ENEMA 7-19 GM/118ML RE ENEM: 1.0000 | ENEMA | RECTAL | Status: DC | PRN

## 2018-01-15 MED ORDER — ACETAMINOPHEN 325 MG PO TABS: 650.0000 mg | ORAL_TABLET | ORAL | Status: DC | PRN

## 2018-01-15 MED ORDER — LIDOCAINE HCL (PF) 1 % IJ SOLN
30.0000 mL | INTRAMUSCULAR | Status: AC | PRN
  Administered 2018-01-16: 30 mL via SUBCUTANEOUS
  Filled 2018-01-15: qty 30

## 2018-01-15 MED ORDER — LACTATED RINGERS IV SOLN: 500.0000 mL | INTRAVENOUS | Status: DC | PRN

## 2018-01-15 MED ORDER — OXYCODONE-ACETAMINOPHEN 5-325 MG PO TABS: 2.0000 | ORAL_TABLET | ORAL | Status: DC | PRN

## 2018-01-15 MED ORDER — MENTHOL 3 MG MT LOZG
1.0000 | LOZENGE | OROMUCOSAL | Status: DC | PRN
  Administered 2018-01-15: 3 mg via ORAL
  Filled 2018-01-15: qty 9

## 2018-01-15 MED ORDER — LACTATED RINGERS IV SOLN: INTRAVENOUS | Status: DC

## 2018-01-15 MED ORDER — PENICILLIN G POT IN DEXTROSE 60000 UNIT/ML IV SOLN
3.0000 10*6.[IU] | INTRAVENOUS | Status: DC
  Administered 2018-01-16 (×2): 3 10*6.[IU] via INTRAVENOUS
  Filled 2018-01-15 (×4): qty 50

## 2018-01-15 MED ORDER — LABETALOL HCL 200 MG PO TABS
200.0000 mg | ORAL_TABLET | Freq: Once | ORAL | Status: AC
  Administered 2018-01-15: 200 mg via ORAL
  Filled 2018-01-15: qty 1

## 2018-01-15 MED ORDER — OXYTOCIN 40 UNITS IN LACTATED RINGERS INFUSION - SIMPLE MED
2.5000 [IU]/h | INTRAVENOUS | Status: DC
  Filled 2018-01-15: qty 1000

## 2018-01-15 MED ORDER — OXYTOCIN BOLUS FROM INFUSION
500.0000 mL | Freq: Once | INTRAVENOUS | Status: AC
  Administered 2018-01-16: 500 mL/h via INTRAVENOUS

## 2018-01-15 MED ORDER — SODIUM CHLORIDE 0.9 % IV SOLN
5.0000 10*6.[IU] | Freq: Once | INTRAVENOUS | Status: AC
  Administered 2018-01-15: 5 10*6.[IU] via INTRAVENOUS
  Filled 2018-01-15: qty 5

## 2018-01-15 MED ORDER — ONDANSETRON HCL 4 MG/2ML IJ SOLN: 4.0000 mg | Freq: Four times a day (QID) | INTRAMUSCULAR | Status: DC | PRN

## 2018-01-15 NOTE — Progress Notes (Signed)
Patient ID: Carla Drapehandler Y [place], female   DOB: 07/26/1991, 27 y.o.   MRN: [id number] Case reviewed with Dr. Otho PerlNitsche of MFM following her u/s. Due to her rising Cr, he recommended IOL. U/s shows appropriate growth and BPP 8/8. Her plts are low. He stated to give 2nd BMZ now and move her toward delivery. I came up and discussed with the patient and her significant other. The significant other was against IOL at this early gestational age. They have not spoken with NICU. I did advise of generally good outcomes at 28 wks but will have the Neonatologist come speak with the parents. I have agreed to stepping up timing of second BMZ and repeat labs at 1700 today. The parents will then decide how much longer they want to wait prior to IOL. Advised risks of elevated BP, seizure risk which is less considering she is on Magnesium Sulfate and risk of loss of renal function. They verbalized understanding. Nursing staff are aware of plan. FHR is reassuring.

## 2018-01-15 NOTE — Progress Notes (Signed)
Patient ID: Dominique Shea, female   DOB: 07/18/1991, 27 y.o.   MRN: [id number] FACULTY PRACTICE ANTEPARTUM NOTE  Dominique Shea is a 27 y.o. [id number] at 6179w6d  who is admitted for preeclampsia with severe features.   Fetal presentation is cephalic. Length of Stay:  1  Days  Subjective: Feels somewhat better, HA gone, swelling a little improved. BP controlled on magnesium and labetalol 200mg  TID Patient reports good fetal movement.   She reports no uterine contractions She reports no bleeding  She reports no loss of fluid per vagina.  Vitals:  Blood pressure 120/79, pulse 66, temperature 98.1 F (36.7 C), temperature source Oral, resp. rate 15, last menstrual period 06/29/2017, SpO2 96 %. Physical Examination:  General appearance - alert, well appearing, and in no distress Chest - clear to auscultation, no wheezes, rales or rhonchi, symmetric air entry Heart - normal rate, regular rhythm, normal S1, S2, no murmurs, rubs, clicks or gallops Abdomen - soft, nontender, nondistended, no masses or organomegaly Fundal Height:  size equals dates Extremities: extremities normal, atraumatic, no cyanosis or edema with DTRs 3+ bilaterally Membranes:intact  Fetal Monitoring:  Baseline: 130 bpm, Variability: Good {> 6 bpm), Accelerations: Reactive and Decelerations: Absent  Labs:  Results for orders placed or performed during the hospital encounter of 01/14/18 (from the past 24 hour(s))  CBC on admission   Collection Time: 01/14/18  9:47 PM  Result Value Ref Range   WBC 18.4 (H) 4.0 - 10.5 K/uL   RBC 4.22 3.87 - 5.11 MIL/uL   Hemoglobin 13.6 12.0 - 15.0 g/dL   HCT 13.037.6 86.536.0 - 78.446.0 %   MCV 89.1 78.0 - 100.0 fL   MCH 32.2 26.0 - 34.0 pg   MCHC 36.2 (H) 30.0 - 36.0 g/dL   RDW 69.613.3 29.511.5 - 28.415.5 %   Platelets 63 (L) 150 - 400 K/uL  Comprehensive metabolic panel   Collection Time: 01/14/18  9:47 PM  Result Value Ref Range   Sodium 138 135 - 145 mmol/L   Potassium 3.3 (L) 3.5 - 5.1 mmol/L    Chloride 112 (H) 101 - 111 mmol/L   CO2 15 (L) 22 - 32 mmol/L   Glucose, Bld 108 (H) 65 - 99 mg/dL   BUN 14 6 - 20 mg/dL   Creatinine, Ser 1.320.94 0.44 - 1.00 mg/dL   Calcium 8.2 (L) 8.9 - 10.3 mg/dL   Total Protein 5.7 (L) 6.5 - 8.1 g/dL   Albumin 2.8 (L) 3.5 - 5.0 g/dL   AST 55 (H) 15 - 41 U/L   ALT 29 14 - 54 U/L   Alkaline Phosphatase 90 38 - 126 U/L   Total Bilirubin 1.6 (H) 0.3 - 1.2 mg/dL   GFR calc non Af Amer >60 >60 mL/min   GFR calc Af Amer >60 >60 mL/min   Anion gap 11 5 - 15  Type and screen [facility]   Collection Time: 01/14/18  9:47 PM  Result Value Ref Range   ABO/RH(D) A POS    Antibody Screen NEG    Sample Expiration      01/17/2018 Performed at [facility], [address] [place], [place] [id number]   DIC (disseminated intravasc coag) panel   Collection Time: 01/14/18  9:49 PM  Result Value Ref Range   Prothrombin Time 12.3 11.4 - 15.2 seconds   INR 0.92    aPTT 33 24 - 36 seconds   Fibrinogen 373 210 - 475 mg/dL   D-Dimer, Sharene ButtersQuant  5.55 (H) 0.00 - 0.50 ug/mL-FEU   Platelets 63 (L) 150 - 400 K/uL   Smear Review NO SCHISTOCYTES SEEN   CBC   Collection Time: 01/15/18  5:08 AM  Result Value Ref Range   WBC 16.1 (H) 4.0 - 10.5 K/uL   RBC 4.08 3.87 - 5.11 MIL/uL   Hemoglobin 13.0 12.0 - 15.0 g/dL   HCT 16.1 09.6 - 04.5 %   MCV 90.0 78.0 - 100.0 fL   MCH 31.9 26.0 - 34.0 pg   MCHC 35.4 30.0 - 36.0 g/dL   RDW 40.9 81.1 - 91.4 %   Platelets 57 (L) 150 - 400 K/uL  Comprehensive metabolic panel   Collection Time: 01/15/18  5:08 AM  Result Value Ref Range   Sodium 135 135 - 145 mmol/L   Potassium 3.8 3.5 - 5.1 mmol/L   Chloride 108 101 - 111 mmol/L   CO2 16 (L) 22 - 32 mmol/L   Glucose, Bld 128 (H) 65 - 99 mg/dL   BUN 16 6 - 20 mg/dL   Creatinine, Ser 7.82 (H) 0.44 - 1.00 mg/dL   Calcium 7.7 (L) 8.9 - 10.3 mg/dL   Total Protein 5.6 (L) 6.5 - 8.1 g/dL   Albumin 2.6 (L) 3.5 - 5.0 g/dL   AST 49 (H) 15 - 41 U/L   ALT 26 14 - 54 U/L    Alkaline Phosphatase 81 38 - 126 U/L   Total Bilirubin 1.1 0.3 - 1.2 mg/dL   GFR calc non Af Amer >60 >60 mL/min   GFR calc Af Amer >60 >60 mL/min   Anion gap 11 5 - 15  Results for orders placed or performed during the hospital encounter of 01/14/18 (from the past 24 hour(s))  Protein / creatinine ratio, urine   Collection Time: 01/14/18  6:39 PM  Result Value Ref Range   Creatinine, Urine 53 mg/dL   Total Protein, Urine 826 mg/dL   Protein Creatinine Ratio 15.58 (H) 0.00 - 0.15 mg/mg[Cre]  Urine Drug Screen, Qualitative ([facility] only)   Collection Time: 01/14/18  6:39 PM  Result Value Ref Range   Tricyclic, Ur Screen NONE DETECTED NONE DETECTED   Amphetamines, Ur Screen NONE DETECTED NONE DETECTED   MDMA (Ecstasy)Ur Screen NONE DETECTED NONE DETECTED   Cocaine Metabolite,Ur Betances NONE DETECTED NONE DETECTED   Opiate, Ur Screen NONE DETECTED NONE DETECTED   Phencyclidine (PCP) Ur S NONE DETECTED NONE DETECTED   Cannabinoid 50 Ng, Ur Rodman POSITIVE (A) NONE DETECTED   Barbiturates, Ur Screen NONE DETECTED NONE DETECTED   Benzodiazepine, Ur Scrn NONE DETECTED NONE DETECTED   Methadone Scn, Ur NONE DETECTED NONE DETECTED  CBC   Collection Time: 01/14/18  6:49 PM  Result Value Ref Range   WBC 14.1 (H) 3.6 - 11.0 K/uL   RBC 4.33 3.80 - 5.20 MIL/uL   Hemoglobin 14.2 12.0 - 16.0 g/dL   HCT 95.6 21.3 - 08.6 %   MCV 93.3 80.0 - 100.0 fL   MCH 32.9 26.0 - 34.0 pg   MCHC 35.2 32.0 - 36.0 g/dL   RDW 57.8 46.9 - 62.9 %   Platelets 53 (L) 150 - 440 K/uL  Comprehensive metabolic panel   Collection Time: 01/14/18  6:49 PM  Result Value Ref Range   Sodium 136 135 - 145 mmol/L   Potassium 3.4 (L) 3.5 - 5.1 mmol/L   Chloride 111 101 - 111 mmol/L   CO2 19 (L) 22 - 32 mmol/L   Glucose, Bld 85  65 - 99 mg/dL   BUN 14 6 - 20 mg/dL   Creatinine, Ser 1.61 0.44 - 1.00 mg/dL   Calcium 8.1 (L) 8.9 - 10.3 mg/dL   Total Protein 5.9 (L) 6.5 - 8.1 g/dL   Albumin 2.9 (L) 3.5 - 5.0 g/dL   AST 50 (H) 15 -  41 U/L   ALT 24 14 - 54 U/L   Alkaline Phosphatase 82 38 - 126 U/L   Total Bilirubin 1.5 (H) 0.3 - 1.2 mg/dL   GFR calc non Af Amer >60 >60 mL/min   GFR calc Af Amer >60 >60 mL/min   Anion gap 6 5 - 15    Imaging Studies:       Medications:  Scheduled . betamethasone acetate-betamethasone sodium phosphate  12 mg Intramuscular Q24H  . docusate sodium  100 mg Oral Daily  . labetalol  200 mg Oral TID  . potassium chloride  40 mEq Oral BID  . prenatal multivitamin  1 tablet Oral Q1200   I have reviewed the patient's current medications.  ASSESSMENT: Active Problems:   Thrombocytopenia (HCC)   Substance abuse affecting pregnancy, antepartum   Severe preeclampsia, second trimester   [redacted] weeks gestation of pregnancy   Unwanted fertility   PLAN: 1. Preeclampsia with severe features in 3rd Trimester 1. ? Early HELLP 2. AST slightly elevated, although stable 3. Cr elevating: 0.97 --> 1.14 4. Recheck tonight 5. Continue magnesium 6. BMZ at 1900. 7. [place] today 2. Thrombocytopenia 1. Stable 2. ? Steroid/magnesium effect 3. Continue to monitor: CBC tonight 3. Substance abuse 1. THC in UDS 4. Unwanted fertility 1. BTL papers signed yesterday 5. 27 weeks geation 1. NST reactive   Continue routine antenatal care.   Levie Heritage, DO 01/15/2018,7:22 AM

## 2018-01-15 NOTE — Progress Notes (Signed)
Patient ID: Dominique Shea, female   DOB: 03/27/1991, 27 y.o.   MRN: [id number] Labs are slightly improved this pm, due to steroid effect. Patient still meets criteria for delivery. After conversation with Neonatology, she elects for IOL>

## 2018-01-15 NOTE — Progress Notes (Signed)
Patient ID: Dominique Shea, female   DOB: 05/28/1991, 27 y.o.   MRN: [id number]  Feels okay; s/p cyto x 1; recently was vomiting and now s/p Zofran; no H/A; on mag sulfate  BP 145/94, other VSS FHR 120s, +10x10 accels, no decels Irreg ctx/UI Cx 1/thick/-3  IUP@27 .6wks Severe pre-e/HELLP Cx unfavorable  Cook cervical foley placed; will start Pit and hold at 946mu/min until foley comes out, then titrate up to active labor Labs q 12h (next ~4am)  Cam HaiSHAW, Dalylah Ramey CNM 01/15/2018 10:46 PM

## 2018-01-15 NOTE — Consult Note (Signed)
Asked by Dr.Pratt to provide prenatal consultation for 27 y.o. G4 P2-0-1-0 mother with severe preeclampsia/HELLP at 27.[redacted] wks EGA.  MFM recommends IOL. Received 2nd dose of BMZ and has been on Mg since [facility].  FOB with reservations about early delivery   Spoke with patient and FOB in her room on Women's Unit. Discussed usual expectations for preterm infant at 2828 weeks gestation and emphasized very good overall prognosis with survival about 95%, with chances enhanced by BMZ.  Discussed possible needs for DR resuscitation, respiratory support, IV access, blood products and also possible length of stay in NICU until 37 - [redacted] wks EGA.   Also mentioned [facility] when baby no longer needs Level 3 care.  Discussed advantages of feeding with mother's milk and option to use donor milk as "bridge" until her supply is established.  She plans to pump postnatally.  With respect to the FOB's concern about early delivery I explained that all providers are in agreement that delivery should be delayed as long as possible but that both mother and baby could suffer serious complications if prolongation of pregnancy was unsafe.  FOB stated his [facility] and that he was now 27 yo and healthy.  Both patient and FOB were attentive, had appropriate questions, and expressed appreciation of my input.  Thank you for consulting Neonatology.  Total time 30 minutes (15 minutes face-to-face)  JWimmer, MD

## 2018-01-16 ENCOUNTER — Encounter (HOSPITAL_COMMUNITY): Payer: Self-pay

## 2018-01-16 ENCOUNTER — Encounter (HOSPITAL_COMMUNITY): Payer: Self-pay | Admitting: *Deleted

## 2018-01-16 DIAGNOSIS — F1220 Cannabis dependence, uncomplicated: Secondary | ICD-10-CM

## 2018-01-16 DIAGNOSIS — Z3A27 27 weeks gestation of pregnancy: Secondary | ICD-10-CM

## 2018-01-16 DIAGNOSIS — O1424 HELLP syndrome, complicating childbirth: Secondary | ICD-10-CM

## 2018-01-16 DIAGNOSIS — O99324 Drug use complicating childbirth: Secondary | ICD-10-CM

## 2018-01-16 LAB — CBC
HCT: 35 % — ABNORMAL LOW (ref 36.0–46.0)
HCT: 33.9 % — ABNORMAL LOW (ref 36.0–46.0)
HEMATOCRIT: 30.1 % — AB (ref 36.0–46.0)
HEMOGLOBIN: 12.2 g/dL (ref 12.0–15.0)
HEMOGLOBIN: 10.5 g/dL — AB (ref 12.0–15.0)
Hemoglobin: 12.6 g/dL (ref 12.0–15.0)
MCH: 32.1 pg (ref 26.0–34.0)
MCH: 32.6 pg (ref 26.0–34.0)
MCH: 31.7 pg (ref 26.0–34.0)
MCHC: 36 g/dL (ref 30.0–36.0)
MCHC: 36 g/dL (ref 30.0–36.0)
MCHC: 34.9 g/dL (ref 30.0–36.0)
MCV: 89.3 fL (ref 78.0–100.0)
MCV: 90.6 fL (ref 78.0–100.0)
MCV: 90.9 fL (ref 78.0–100.0)
PLATELETS: 68 10*3/uL — AB (ref 150–400)
PLATELETS: 74 10*3/uL — AB (ref 150–400)
Platelets: 56 10*3/uL — ABNORMAL LOW (ref 150–400)
RBC: 3.92 MIL/uL (ref 3.87–5.11)
RBC: 3.74 MIL/uL — AB (ref 3.87–5.11)
RBC: 3.31 MIL/uL — ABNORMAL LOW (ref 3.87–5.11)
RDW: 13.8 % (ref 11.5–15.5)
RDW: 13.9 % (ref 11.5–15.5)
RDW: 14.1 % (ref 11.5–15.5)
WBC: 19.3 10*3/uL — ABNORMAL HIGH (ref 4.0–10.5)
WBC: 28.5 10*3/uL — AB (ref 4.0–10.5)
WBC: 18.9 10*3/uL — ABNORMAL HIGH (ref 4.0–10.5)

## 2018-01-16 LAB — TYPE AND SCREEN
ABO/RH(D): A POS
ANTIBODY SCREEN: NEGATIVE

## 2018-01-16 LAB — COMPREHENSIVE METABOLIC PANEL
ALK PHOS: 73 U/L (ref 38–126)
ALT: 19 U/L (ref 14–54)
AST: 29 U/L (ref 15–41)
Albumin: 2.5 g/dL — ABNORMAL LOW (ref 3.5–5.0)
Anion gap: 10 (ref 5–15)
BILIRUBIN TOTAL: 0.8 mg/dL (ref 0.3–1.2)
BUN: 20 mg/dL (ref 6–20)
CALCIUM: 7 mg/dL — AB (ref 8.9–10.3)
CO2: 17 mmol/L — ABNORMAL LOW (ref 22–32)
Chloride: 104 mmol/L (ref 101–111)
Creatinine, Ser: 1.1 mg/dL — ABNORMAL HIGH (ref 0.44–1.00)
GFR calc Af Amer: >60 mL/min (ref >60)
Glucose, Bld: 124 mg/dL — ABNORMAL HIGH (ref 65–99)
Potassium: 4.1 mmol/L (ref 3.5–5.1)
Sodium: 131 mmol/L — ABNORMAL LOW (ref 135–145)
TOTAL PROTEIN: 5.4 g/dL — AB (ref 6.5–8.1)

## 2018-01-16 LAB — MAGNESIUM: Magnesium: 7.5 mg/dL (ref 1.7–2.4)

## 2018-01-16 MED ORDER — WITCH HAZEL-GLYCERIN EX PADS: 1.0000 "application " | MEDICATED_PAD | CUTANEOUS | Status: DC | PRN

## 2018-01-16 MED ORDER — SIMETHICONE 80 MG PO CHEW: 80.0000 mg | CHEWABLE_TABLET | ORAL | Status: DC | PRN

## 2018-01-16 MED ORDER — TETANUS-DIPHTH-ACELL PERTUSSIS 5-2.5-18.5 LF-MCG/0.5 IM SUSP: 0.5000 mL | Freq: Once | INTRAMUSCULAR | Status: DC

## 2018-01-16 MED ORDER — DIBUCAINE 1 % RE OINT
1.0000 "application " | TOPICAL_OINTMENT | RECTAL | Status: DC | PRN
  Filled 2018-01-16: qty 28

## 2018-01-16 MED ORDER — PRENATAL MULTIVITAMIN CH
1.0000 | ORAL_TABLET | Freq: Every day | ORAL | Status: DC
  Administered 2018-01-17 – 2018-01-21 (×5): 1 via ORAL
  Filled 2018-01-16 (×5): qty 1

## 2018-01-16 MED ORDER — SENNOSIDES-DOCUSATE SODIUM 8.6-50 MG PO TABS
2.0000 | ORAL_TABLET | ORAL | Status: DC
  Administered 2018-01-17: 2 via ORAL
  Filled 2018-01-16: qty 2

## 2018-01-16 MED ORDER — FENTANYL CITRATE (PF) 100 MCG/2ML IJ SOLN
100.0000 ug | INTRAMUSCULAR | Status: DC | PRN
  Administered 2018-01-16 (×3): 100 ug via INTRAVENOUS
  Filled 2018-01-16 (×3): qty 2

## 2018-01-16 MED ORDER — ONDANSETRON HCL 4 MG PO TABS
4.0000 mg | ORAL_TABLET | ORAL | Status: DC | PRN
  Administered 2018-01-17: 4 mg via ORAL
  Filled 2018-01-16: qty 1

## 2018-01-16 MED ORDER — SODIUM CHLORIDE 0.9 % IV SOLN
INTRAVENOUS | Status: DC
  Administered 2018-01-16 – 2018-01-17 (×2): via INTRAVENOUS

## 2018-01-16 MED ORDER — ONDANSETRON HCL 4 MG/2ML IJ SOLN: 4.0000 mg | INTRAMUSCULAR | Status: DC | PRN

## 2018-01-16 MED ORDER — ZOLPIDEM TARTRATE 5 MG PO TABS: 5.0000 mg | ORAL_TABLET | Freq: Every evening | ORAL | Status: DC | PRN

## 2018-01-16 MED ORDER — BENZOCAINE-MENTHOL 20-0.5 % EX AERO: 1.0000 "application " | INHALATION_SPRAY | CUTANEOUS | Status: DC | PRN

## 2018-01-16 MED ORDER — ACETAMINOPHEN 325 MG PO TABS
650.0000 mg | ORAL_TABLET | ORAL | Status: DC | PRN
  Filled 2018-01-16: qty 2

## 2018-01-16 MED ORDER — COCONUT OIL OIL: 1.0000 "application " | TOPICAL_OIL | Status: DC | PRN

## 2018-01-16 NOTE — Progress Notes (Signed)
Patient ID: Carla Drapehandler Y Schutter, female   DOB: 11/17/1990, 27 y.o.   MRN: [id number]  Foley out at approx 0330; Pit increasing since then  BP 118/76, other VSS FHR 115, +10x10accels, no decels Ctx irreg w/ Pit @ 296mu/min Cx deferred now, but was 4/80 at 0455  Plts 68@0145   IUP@28 .0wks Severe pre-e/HELLP IOL process  Will continue to increase Pit to achieve active labor Labs repeat q12h  Cam HaiSHAW, Twisha Vanpelt [facility] 01/16/2018 6:39 AM

## 2018-01-16 NOTE — Progress Notes (Signed)
Called NICU to evaluate for mother visit-baby receiving one on one care and unable to have multiple visitors at this time.

## 2018-01-16 NOTE — Progress Notes (Signed)
CRITICAL VALUE STICKER  CRITICAL VALUE: Magnesium 7.5  RECEIVER (on-site recipient of call): Oletta LamasAbigail Shivank Pinedo RN  DATE & TIME NOTIFIED: 01/16/18 @1212   MESSENGER (representative from lab):  MD NOTIFIED: Earlene Plateravis  TIME OF NOTIFICATION: 1226  RESPONSE: no new order

## 2018-01-16 NOTE — Anesthesia Pain Management Evaluation Note (Signed)
  CRNA Pain Management Visit Note  Patient: Dominique Shea, 27 y.o., female  "Hello I am a member of the anesthesia team at [facility]. We have an anesthesia team available at all times to provide care throughout the hospital, including epidural management and anesthesia for C-section. I don't know your plan for the delivery whether it a natural birth, water birth, IV sedation, nitrous supplementation, doula or epidural, but we want to meet your pain goals."   1.Was your pain managed to your expectations on prior hospitalizations?   Yes   2.What is your expectation for pain management during this hospitalization?     IV pain meds  3.How can we help you reach that goal? unsure  Record the patient's initial score and the patient's pain goal.   Pain: 8  Pain Goal: 8 [facility] wants you to be able to say your pain was always managed very well.  Cephus ShellingBURGER,Dominique Shea 01/16/2018

## 2018-01-17 ENCOUNTER — Inpatient Hospital Stay (HOSPITAL_COMMUNITY): Payer: Medicaid Other

## 2018-01-17 LAB — COMPREHENSIVE METABOLIC PANEL
ALBUMIN: 2.3 g/dL — AB (ref 3.5–5.0)
ALK PHOS: 61 U/L (ref 38–126)
ALT: 16 U/L (ref 14–54)
ALT: 60 U/L — ABNORMAL HIGH (ref 14–54)
ALT: 103 U/L — AB (ref 14–54)
ALT: 123 U/L — ABNORMAL HIGH (ref 14–54)
ANION GAP: 7 (ref 5–15)
ANION GAP: 9 (ref 5–15)
AST: 21 U/L (ref 15–41)
AST: 103 U/L — ABNORMAL HIGH (ref 15–41)
AST: 211 U/L — AB (ref 15–41)
AST: 246 U/L — ABNORMAL HIGH (ref 15–41)
Albumin: 2.2 g/dL — ABNORMAL LOW (ref 3.5–5.0)
Albumin: 2.3 g/dL — ABNORMAL LOW (ref 3.5–5.0)
Albumin: 2.2 g/dL — ABNORMAL LOW (ref 3.5–5.0)
Alkaline Phosphatase: 60 U/L (ref 38–126)
Alkaline Phosphatase: 59 U/L (ref 38–126)
Alkaline Phosphatase: 63 U/L (ref 38–126)
Anion gap: 7 (ref 5–15)
Anion gap: 8 (ref 5–15)
BILIRUBIN TOTAL: 0.7 mg/dL (ref 0.3–1.2)
BILIRUBIN TOTAL: 1.4 mg/dL — AB (ref 0.3–1.2)
BUN: 20 mg/dL (ref 6–20)
BUN: 20 mg/dL (ref 6–20)
BUN: 18 mg/dL (ref 6–20)
BUN: 17 mg/dL (ref 6–20)
CALCIUM: 6.6 mg/dL — AB (ref 8.9–10.3)
CHLORIDE: 106 mmol/L (ref 101–111)
CO2: 20 mmol/L — ABNORMAL LOW (ref 22–32)
CO2: 20 mmol/L — ABNORMAL LOW (ref 22–32)
CO2: 21 mmol/L — ABNORMAL LOW (ref 22–32)
CO2: 22 mmol/L (ref 22–32)
CREATININE: 0.84 mg/dL (ref 0.44–1.00)
Calcium: 6.5 mg/dL — ABNORMAL LOW (ref 8.9–10.3)
Calcium: 6.4 mg/dL — CL (ref 8.9–10.3)
Calcium: 7 mg/dL — ABNORMAL LOW (ref 8.9–10.3)
Chloride: 107 mmol/L (ref 101–111)
Chloride: 107 mmol/L (ref 101–111)
Chloride: 108 mmol/L (ref 101–111)
Creatinine, Ser: 0.97 mg/dL (ref 0.44–1.00)
Creatinine, Ser: 1.06 mg/dL — ABNORMAL HIGH (ref 0.44–1.00)
Creatinine, Ser: 0.77 mg/dL (ref 0.44–1.00)
GFR calc Af Amer: >60 mL/min (ref >60)
GFR calc non Af Amer: >60 mL/min (ref >60)
GFR calc non Af Amer: >60 mL/min (ref >60)
GFR calc non Af Amer: >60 mL/min (ref >60)
GLUCOSE: 90 mg/dL (ref 65–99)
GLUCOSE: 93 mg/dL (ref 65–99)
Glucose, Bld: 110 mg/dL — ABNORMAL HIGH (ref 65–99)
Glucose, Bld: 120 mg/dL — ABNORMAL HIGH (ref 65–99)
POTASSIUM: 4.1 mmol/L (ref 3.5–5.1)
POTASSIUM: 3.7 mmol/L (ref 3.5–5.1)
POTASSIUM: 3.6 mmol/L (ref 3.5–5.1)
Potassium: 3.3 mmol/L — ABNORMAL LOW (ref 3.5–5.1)
SODIUM: 136 mmol/L (ref 135–145)
SODIUM: 138 mmol/L (ref 135–145)
Sodium: 133 mmol/L — ABNORMAL LOW (ref 135–145)
Sodium: 135 mmol/L (ref 135–145)
TOTAL PROTEIN: 5 g/dL — AB (ref 6.5–8.1)
TOTAL PROTEIN: 4.8 g/dL — AB (ref 6.5–8.1)
TOTAL PROTEIN: 4.8 g/dL — AB (ref 6.5–8.1)
Total Bilirubin: 0.7 mg/dL (ref 0.3–1.2)
Total Bilirubin: 1.8 mg/dL — ABNORMAL HIGH (ref 0.3–1.2)
Total Protein: 4.8 g/dL — ABNORMAL LOW (ref 6.5–8.1)

## 2018-01-17 LAB — CBC
HCT: 29.2 % — ABNORMAL LOW (ref 36.0–46.0)
HEMATOCRIT: 30 % — AB (ref 36.0–46.0)
HEMATOCRIT: 28.9 % — AB (ref 36.0–46.0)
HEMOGLOBIN: 10.1 g/dL — AB (ref 12.0–15.0)
Hemoglobin: 10.4 g/dL — ABNORMAL LOW (ref 12.0–15.0)
Hemoglobin: 10.1 g/dL — ABNORMAL LOW (ref 12.0–15.0)
MCH: 32 pg (ref 26.0–34.0)
MCH: 32.2 pg (ref 26.0–34.0)
MCH: 31.9 pg (ref 26.0–34.0)
MCHC: 34.7 g/dL (ref 30.0–36.0)
MCHC: 34.9 g/dL (ref 30.0–36.0)
MCHC: 34.6 g/dL (ref 30.0–36.0)
MCV: 92.3 fL (ref 78.0–100.0)
MCV: 92 fL (ref 78.0–100.0)
MCV: 92.1 fL (ref 78.0–100.0)
Platelets: 53 10*3/uL — ABNORMAL LOW (ref 150–400)
Platelets: 46 10*3/uL — ABNORMAL LOW (ref 150–400)
Platelets: 38 10*3/uL — ABNORMAL LOW (ref 150–400)
RBC: 3.25 MIL/uL — ABNORMAL LOW (ref 3.87–5.11)
RBC: 3.14 MIL/uL — AB (ref 3.87–5.11)
RBC: 3.17 MIL/uL — ABNORMAL LOW (ref 3.87–5.11)
RDW: 14.3 % (ref 11.5–15.5)
RDW: 14.5 % (ref 11.5–15.5)
RDW: 14.6 % (ref 11.5–15.5)
WBC: 15.7 10*3/uL — ABNORMAL HIGH (ref 4.0–10.5)
WBC: 16.2 10*3/uL — AB (ref 4.0–10.5)
WBC: 13.7 10*3/uL — ABNORMAL HIGH (ref 4.0–10.5)

## 2018-01-17 LAB — FIBRINOGEN: Fibrinogen: 228 mg/dL (ref 210–475)

## 2018-01-17 LAB — MAGNESIUM: Magnesium: 6.8 mg/dL (ref 1.7–2.4)

## 2018-01-17 LAB — PROTIME-INR
INR: 0.97
PROTHROMBIN TIME: 12.8 s (ref 11.4–15.2)

## 2018-01-17 LAB — APTT: aPTT: 28 seconds (ref 24–36)

## 2018-01-17 MED ORDER — HYDRALAZINE HCL 20 MG/ML IJ SOLN
10.0000 mg | Freq: Once | INTRAMUSCULAR | Status: AC
  Administered 2018-01-17: 10 mg via INTRAVENOUS
  Filled 2018-01-17: qty 1

## 2018-01-17 MED ORDER — LABETALOL HCL 5 MG/ML IV SOLN
20.0000 mg | INTRAVENOUS | Status: AC | PRN
  Administered 2018-01-18: 40 mg via INTRAVENOUS
  Administered 2018-01-18: 20 mg via INTRAVENOUS
  Filled 2018-01-17: qty 4
  Filled 2018-01-17: qty 8

## 2018-01-17 MED ORDER — AMLODIPINE BESYLATE 10 MG PO TABS
10.0000 mg | ORAL_TABLET | Freq: Every day | ORAL | Status: DC
  Administered 2018-01-17 – 2018-01-21 (×5): 10 mg via ORAL
  Filled 2018-01-17 (×5): qty 1

## 2018-01-17 MED ORDER — SENNOSIDES-DOCUSATE SODIUM 8.6-50 MG PO TABS: 2.0000 | ORAL_TABLET | Freq: Every evening | ORAL | Status: DC | PRN

## 2018-01-17 MED ORDER — HYDRALAZINE HCL 20 MG/ML IJ SOLN
5.0000 mg | INTRAMUSCULAR | Status: AC | PRN
  Administered 2018-01-17: 5 mg via INTRAVENOUS
  Administered 2018-01-17: 10 mg via INTRAVENOUS
  Filled 2018-01-17: qty 1

## 2018-01-17 MED ORDER — LABETALOL HCL 5 MG/ML IV SOLN
20.0000 mg | Freq: Once | INTRAVENOUS | Status: AC
  Administered 2018-01-17: 20 mg via INTRAVENOUS
  Filled 2018-01-17: qty 4

## 2018-01-17 MED ORDER — OXYCODONE HCL 5 MG PO TABS
5.0000 mg | ORAL_TABLET | ORAL | Status: DC | PRN
Start: 2018-01-17 — End: 2018-01-17
  Administered 2018-01-17 (×2): 10 mg via ORAL
  Filled 2018-01-17 (×2): qty 2

## 2018-01-17 MED ORDER — CYCLOBENZAPRINE HCL 10 MG PO TABS
10.0000 mg | ORAL_TABLET | Freq: Three times a day (TID) | ORAL | Status: DC | PRN
  Administered 2018-01-17: 10 mg via ORAL
  Filled 2018-01-17 (×2): qty 1

## 2018-01-17 MED ORDER — POTASSIUM CHLORIDE CRYS ER 20 MEQ PO TBCR
30.0000 meq | EXTENDED_RELEASE_TABLET | Freq: Two times a day (BID) | ORAL | Status: AC
  Administered 2018-01-18 – 2018-01-19 (×4): 30 meq via ORAL
  Filled 2018-01-17 (×5): qty 1

## 2018-01-17 MED ORDER — OXYCODONE HCL 5 MG PO TABS
5.0000 mg | ORAL_TABLET | ORAL | Status: DC | PRN
  Administered 2018-01-18: 5 mg via ORAL
  Filled 2018-01-17 (×2): qty 1

## 2018-01-17 MED FILL — Hydralazine HCl Inj 20 MG/ML: INTRAMUSCULAR | Qty: 1 | Status: AC

## 2018-01-17 NOTE — Lactation Note (Signed)
This note was copied from a baby's chart. Lactation Consultation Note  Patient Name: Dominique Shea Today's Date: 01/17/2018 Reason for consult: Initial assessment;Preterm <34wks;NICU baby Mom is attempting to pump every 3 hours but not feeling well.  Instructed to do her best and discussed milk coming to volume.  Mom denies questions at present time.  [place] in [place] a time.  Recommended [facility] and notify them she has a NICU baby.  [facility].  Encouraged to call for assist prn.  Maternal Data    Feeding    LATCH Score                   Interventions    Lactation Tools Discussed/Used Pump Review: Setup, frequency, and cleaning Initiated by:: RN Date initiated:: 01/16/18   Consult Status Consult Status: Follow-up Date: 01/18/18 Follow-up type: In-patient    Huston FoleyMOULDEN, Kenden Brandt S 01/17/2018, 1:47 PM

## 2018-01-17 NOTE — Progress Notes (Signed)
Interval Postpartum Note  Admission Date: 01/14/2018 Current Date: 01/17/2018 6:00 PM  Dominique Shea is a 27 y.o. [id number] PPD#1 s/p SVD/1st (not repaired), admitted for HELLP.  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 Spontaneous vaginal delivery 01/16/2018  . Postpartum care following vaginal delivery 01/16/2018  . Elevated blood pressure affecting pregnancy in second trimester, antepartum 01/14/2018  . HELLP (hemolytic anemia/elev liver enzymes/low platelets in pregnancy), second trimester 01/14/2018  . Unwanted fertility 01/14/2018  . Substance abuse affecting pregnancy, antepartum 09/26/2017  . Ptyalism 09/14/2017  . Adjustment disorder with anxiety 09/08/2017  . Gonorrhea affecting pregnancy in first trimester 08/24/2017  . Nausea and vomiting during pregnancy prior to [redacted] weeks gestation 08/24/2017  . Supervision of other normal pregnancy, antepartum 08/16/2017  . Thrombocytopenia (HCC) 07/12/2011    Subjective:  Called about pt re: lower platelets. Per RN, pt is overall stable and endorsing right to midline upper back pain. Lochia is appropriate and pt overall feels fine.   Objective:    Current Vital Signs 24h Vital Sign Ranges  T 99.3 F (37.4 C) Temp  Avg: 98.3 F (36.8 C)  Min: 97.6 F (36.4 C)  Max: 99.3 F (37.4 C)  BP 133/86 BP  Min: 125/73  Max: 182/98  HR 90 Pulse  Avg: 70.1  Min: 54  Max: 90  RR 18 Resp  Avg: 17.9  Min: 17  Max: 18  SaO2 97 % Room Air SpO2  Avg: 98.3 %  Min: 97 %  Max: 100 %       24 Hour I/O Current Shift I/O  Time Ins Outs 04/22 0701 - 04/23 0700 In: 4237.8 [P.O.:2110; I.V.:1977.8] Out: 3720 [Urine:3470] 04/23 0701 - 04/23 1900 In: -  Out: 375 [Urine:375]   Patient Vitals for the past 24 hrs:  BP Temp Temp src Pulse Resp SpO2  01/17/18 1700 133/86 - - 90 - -  01/17/18 1630 (!) 147/91 - - 79 - -  01/17/18 1551 (!) 138/92 99.3 F (37.4 C)  Oral 78 18 97 %  01/17/18 1430 (!) 151/89 - - 81 - -  01/17/18 1400 (!) 144/93 - - 74 - -  01/17/18 1330 (!) 141/91 - - 69 - -  01/17/18 1300 (!) 174/104 - - 70 - -  01/17/18 1246 (!) 175/99 - - - - -  01/17/18 1205 (!) 182/98 97.6 F (36.4 C) Oral (!) 54 18 100 %  01/17/18 0848 (!) 146/84 - - 63 - -  01/17/18 0822 (!) 166/108 98 F (36.7 C) Oral 62 18 98 %  01/17/18 0700 - - - - 18 -  01/17/18 0600 - - - - 18 -  01/17/18 0500 - - - - 18 -  01/17/18 0400 (!) 129/91 98.5 F (36.9 C) Oral 60 18 98 %  01/17/18 0300 - - - - 18 -  01/17/18 0200 - - - - 18 -  01/17/18 0100 - - - - 18 -  01/17/18 0000 138/88 98.2 F (36.8 C) Oral 68 18 100 %  01/16/18 2300 - - - - 18 -  01/16/18 2200 - - - - 18 -  01/16/18 2100 - - - - 18 -  01/16/18 1952 (!) 129/95 98.6 F (37 C) Oral 60 17 97 %  01/16/18 1831 125/73 97.8 F (36.6 C) - 74 18 98 %  UOP: at least 50-17mL/hr  Physical exam: General: pt sleeping  Medications:  Current Facility-Administered Medications  Medication Dose Route Frequency Provider Last Rate Last Dose  . 0.9 %  sodium chloride infusion   Intravenous Continuous Conan Bowens, MD 75 mL/hr at 01/17/18 0206    . acetaminophen (TYLENOL) tablet 650 mg  650 mg Oral Q4H PRN Raelyn Mora, CNM      . amLODipine (NORVASC) tablet 10 mg  10 mg Oral Daily Anyanwu, Ugonna A, MD   10 mg at 01/17/18 1216  . benzocaine-Menthol (DERMOPLAST) 20-0.5 % topical spray 1 application  1 application Topical PRN Raelyn Mora, CNM      . coconut oil  1 application Topical PRN Raelyn Mora, CNM      . cyclobenzaprine (FLEXERIL) tablet 10 mg  10 mg Oral TID PRN Anyanwu, Jethro Bastos, MD   10 mg at 01/17/18 1113  . witch hazel-glycerin (TUCKS) pad 1 application  1 application Topical PRN Raelyn Mora, CNM       And  . dibucaine (NUPERCAINAL) 1 % rectal ointment 1 application  1 application Rectal PRN Arita Miss, Rolitta, CNM      . labetalol (NORMODYNE,TRANDATE) injection 20-40 mg  20-40 mg  Intravenous Q10 min PRN Anyanwu, Ugonna A, MD      . lidocaine (PF) (XYLOCAINE) 1 % injection 30 mL  30 mL Subcutaneous PRN Reva Bores, MD   30 mL at 01/16/18 0909  . ondansetron (ZOFRAN) tablet 4 mg  4 mg Oral Q4H PRN Raelyn Mora, CNM   4 mg at 01/17/18 0053   Or  . ondansetron (ZOFRAN) injection 4 mg  4 mg Intravenous Q4H PRN Raelyn Mora, CNM      . oxyCODONE (Oxy IR/ROXICODONE) immediate release tablet 5-10 mg  5-10 mg Oral Q3H PRN Anyanwu, Jethro Bastos, MD   10 mg at 01/17/18 1231  . potassium chloride (K-DUR,KLOR-CON) CR tablet 30 mEq  30 mEq Oral BID AC Aldine Grainger, Billey Gosling, MD      . prenatal multivitamin tablet 1 tablet  1 tablet Oral Q1200 Raelyn Mora, CNM   1 tablet at 01/17/18 1113  . senna-docusate (Senokot-S) tablet 2 tablet  2 tablet Oral Q24H Raelyn Mora, CNM   2 tablet at 01/17/18 0022  . simethicone (MYLICON) chewable tablet 80 mg  80 mg Oral PRN Raelyn Mora, CNM      . Tdap (BOOSTRIX) injection 0.5 mL  0.5 mL Intramuscular Once Raelyn Mora, CNM      . zolpidem (AMBIEN) tablet 5 mg  5 mg Oral QHS PRN Raelyn Mora, CNM        Labs:  Recent Labs  Lab 01/16/18 2315 01/17/18 1112 01/17/18 1707  WBC 18.9* 15.7* 16.2*  HGB 10.5* 10.4* 10.1*  HCT 30.1* 30.0* 28.9*  PLT 56* 53* 46*    Recent Labs  Lab 01/16/18 2315 01/17/18 1112 01/17/18 1707  NA 133* 136 135  K 4.1 3.7 3.3*  CL 106 107 107  CO2 20* 20* 21*  BUN 20 20 18   CREATININE 0.97 1.06* 0.84  CALCIUM 6.5* 6.4* 6.6*  PROT 4.8* 5.0* 4.8*  BILITOT 0.7 0.7 1.4*  ALKPHOS 60 59 61  ALT 16 60* 103*  AST 21 103* 211*  GLUCOSE 110* 90 120*    Radiology: no imaging  Assessment & Plan:  Pt clinically stable *Postpartum: routine care *HELLP: will do labs q4h and get a set of coags, fibrinogen with next lab check. Will also get liver u/s given increasing transaminases and slightly lower platelets. apap d/c'ed; it doesn't look like she's received any.  *  PPx: SCDs *FEN/GI: regular diet.    Cornelia Copaharlie Haydyn Liddell, Jr. MD Attending Center for [facility](Faculty Practice)

## 2018-01-17 NOTE — Progress Notes (Signed)
Faculty Attending Note  Post Partum Day 1  Subjective: Patient is feeling very well this am. She reports no pain. Denies headache or abdominal pain. She is ambulating and denies light-headedness or dizziness. She is  passing flatus. Voiding regularly. She is tolerating a regular diet without nausea/vomiting. Bleeding is moderate. She is breast pumping and bottle feeding. Baby is in NICU and doing well.  Objective: Blood pressure (!) 129/91, pulse 60, temperature 98.5 F (36.9 C), temperature source Oral, resp. rate 18, height 5\' 4"  (1.626 m), weight 175 lb (79.4 kg), last menstrual period 06/29/2017, SpO2 98 %, unknown if currently breastfeeding. Temp:  [97.4 F (36.3 C)-99 F (37.2 C)] 98.5 F (36.9 C) (04/23 0400) Pulse Rate:  [59-133] 60 (04/23 0400) Resp:  [16-20] 18 (04/23 0500) BP: (125-157)/(69-104) 129/91 (04/23 0400) SpO2:  [97 %-100 %] 98 % (04/23 0400) Weight:  [175 lb (79.4 kg)] 175 lb (79.4 kg) (04/22 1478)  Physical Exam:  General: alert, oriented, cooperative Chest: CTAB, normal respiratory effort Heart: RRR  Abdomen: soft, appropriately tender to palpation  Uterine Fundus: firm, 2 fingers below the umbilicus Lochia: moderate, rubra DVT Evaluation: no evidence of DVT Extremities: no edema, no calf tenderness   Current Facility-Administered Medications:  .  0.9 %  sodium chloride infusion, , Intravenous, Continuous, Conan Bowens, MD, Last Rate: 75 mL/hr at 01/17/18 0206 .  acetaminophen (TYLENOL) tablet 650 mg, 650 mg, Oral, Q4H PRN, Raelyn Mora, CNM .  benzocaine-Menthol (DERMOPLAST) 20-0.5 % topical spray 1 application, 1 application, Topical, PRN, Raelyn Mora, CNM .  coconut oil, 1 application, Topical, PRN, Raelyn Mora, CNM .  witch hazel-glycerin (TUCKS) pad 1 application, 1 application, Topical, PRN **AND** dibucaine (NUPERCAINAL) 1 % rectal ointment 1 application, 1 application, Rectal, PRN, Arita Miss, Rolitta, CNM .  lidocaine (PF) (XYLOCAINE) 1 %  injection 30 mL, 30 mL, Subcutaneous, PRN, Reva Bores, MD, 30 mL at 01/16/18 0909 .  magnesium sulfate 40 grams in LR 500 mL OB infusion, 2 g/hr, Intravenous, Titrated, Conan Bowens, MD, Last Rate: 25 mL/hr at 01/17/18 0200, 2 g/hr at 01/17/18 0200 .  ondansetron (ZOFRAN) tablet 4 mg, 4 mg, Oral, Q4H PRN, 4 mg at 01/17/18 0053 **OR** ondansetron (ZOFRAN) injection 4 mg, 4 mg, Intravenous, Q4H PRN, Raelyn Mora, CNM .  prenatal multivitamin tablet 1 tablet, 1 tablet, Oral, Q1200, Raelyn Mora, CNM, Stopped at 01/16/18 1540 .  senna-docusate (Senokot-S) tablet 2 tablet, 2 tablet, Oral, Q24H, Raelyn Mora, CNM, 2 tablet at 01/17/18 0022 .  simethicone (MYLICON) chewable tablet 80 mg, 80 mg, Oral, PRN, Raelyn Mora, CNM .  Tdap (BOOSTRIX) injection 0.5 mL, 0.5 mL, Intramuscular, Once, Raelyn Mora, CNM .  zolpidem (AMBIEN) tablet 5 mg, 5 mg, Oral, QHS PRN, Raelyn Mora, CNM Recent Labs    01/16/18 1052 01/16/18 2315  HGB 12.2 10.5*  HCT 33.9* 30.1*    Assessment/Plan:  Patient is 27 y.o. [id number] PPD#1 s/p SVD at [redacted]w[redacted]d s/p IOL for ITP and HELLP. Remains on MgSO4 until 9 am this morning. Cr has trended down and she is voiding regularly. Platelets have trended slightly down on last labs to 56. MgSO4 level appropriate. She is doing well with no complaints, bleeding moderate. Reviewed course today, for repeat lab work at 11 am. Reviewed no BTL will be done due to acuity of her course, she would like depo until she can have interval BTL done.   Continue routine post partum care Pain meds prn Regular diet Depo --> interval BTL for  birth control MgSO4 off at 9 am Repeat labs @ 11 am   Conan BowensKelly M Royce 01/17/2018, 6:30 AM

## 2018-01-17 NOTE — Progress Notes (Signed)
AP Note  AF VS normal and stable (occasional mild range BPs) with adequate uop.  S/s unchanged and minimal (normal) lochia Liver u/s negative AST/ALT continue to trend up and plts down; all other labs negative.  Continue with q4h labs. May consider plt transfusion if gets below 20k and would recommend if starts to bleed.  Cornelia Copaharlie Christyanna Mckeon, Jr MD Attending Center for [facility] (Faculty Practice) 01/17/2018 Time: 2222

## 2018-01-17 NOTE — Progress Notes (Signed)
Patient is sleeping in her room. Her back is hurting her but besides that has no complaints. Her blood pressure @ 2215 is 135/82.

## 2018-01-17 NOTE — Progress Notes (Signed)
CRITICAL VALUE ALERT  Critical Value: platelets 38  Date & Time Notied:  01-17-18 @ 2150  Provider Notified: Dr. Primitivo GauzeFletcher  Orders Received/Actions taken: In a surgery and will talk about what to do when they are done.

## 2018-01-17 NOTE — Progress Notes (Addendum)
Dr. Vergie LivingPickens was notified of lab results will continue to monitor patient until next lab draw in four hours. Patient is peeing WNL, Minimal bleeding, and blood pressures WNL.  Will keep Dr. Valorie RooseveltUpdated.

## 2018-01-17 NOTE — Progress Notes (Addendum)
CRITICAL VALUE ALERT  Critical Value:  6.4  Date & Time Notied:  01/17/2018  Provider Notified: Dr. Macon LargeAnyanwu   Orders Received/Actions taken: No new orders   Also notified of plts 53

## 2018-01-17 NOTE — Progress Notes (Signed)
Due to patients platelets being 46 she is a fall precaution. Will follow protocols

## 2018-01-18 LAB — COMPREHENSIVE METABOLIC PANEL
ALBUMIN: 2.6 g/dL — AB (ref 3.5–5.0)
ALK PHOS: 56 U/L (ref 38–126)
ALK PHOS: 58 U/L (ref 38–126)
ALK PHOS: 64 U/L (ref 38–126)
ALT: 135 U/L — AB (ref 14–54)
ALT: 137 U/L — ABNORMAL HIGH (ref 14–54)
ALT: 142 U/L — AB (ref 14–54)
ALT: 163 U/L — AB (ref 14–54)
ALT: 153 U/L — AB (ref 14–54)
ANION GAP: 7 (ref 5–15)
ANION GAP: 7 (ref 5–15)
ANION GAP: 9 (ref 5–15)
AST: 264 U/L — ABNORMAL HIGH (ref 15–41)
AST: 259 U/L — ABNORMAL HIGH (ref 15–41)
AST: 248 U/L — ABNORMAL HIGH (ref 15–41)
AST: 259 U/L — AB (ref 15–41)
AST: 226 U/L — ABNORMAL HIGH (ref 15–41)
Albumin: 2.2 g/dL — ABNORMAL LOW (ref 3.5–5.0)
Albumin: 2.1 g/dL — ABNORMAL LOW (ref 3.5–5.0)
Albumin: 2.3 g/dL — ABNORMAL LOW (ref 3.5–5.0)
Albumin: 2.5 g/dL — ABNORMAL LOW (ref 3.5–5.0)
Alkaline Phosphatase: 56 U/L (ref 38–126)
Alkaline Phosphatase: 65 U/L (ref 38–126)
Anion gap: 8 (ref 5–15)
Anion gap: 7 (ref 5–15)
BILIRUBIN TOTAL: 2.7 mg/dL — AB (ref 0.3–1.2)
BUN: 17 mg/dL (ref 6–20)
BUN: 17 mg/dL (ref 6–20)
BUN: 15 mg/dL (ref 6–20)
BUN: 16 mg/dL (ref 6–20)
BUN: 14 mg/dL (ref 6–20)
CALCIUM: 7 mg/dL — AB (ref 8.9–10.3)
CALCIUM: 7.2 mg/dL — AB (ref 8.9–10.3)
CALCIUM: 8.1 mg/dL — AB (ref 8.9–10.3)
CHLORIDE: 106 mmol/L (ref 101–111)
CHLORIDE: 107 mmol/L (ref 101–111)
CHLORIDE: 107 mmol/L (ref 101–111)
CO2: 21 mmol/L — ABNORMAL LOW (ref 22–32)
CO2: 21 mmol/L — ABNORMAL LOW (ref 22–32)
CO2: 22 mmol/L (ref 22–32)
CO2: 21 mmol/L — ABNORMAL LOW (ref 22–32)
CO2: 22 mmol/L (ref 22–32)
CREATININE: 0.87 mg/dL (ref 0.44–1.00)
CREATININE: 0.8 mg/dL (ref 0.44–1.00)
CREATININE: 0.77 mg/dL (ref 0.44–1.00)
CREATININE: 0.71 mg/dL (ref 0.44–1.00)
Calcium: 6.9 mg/dL — ABNORMAL LOW (ref 8.9–10.3)
Calcium: 7.9 mg/dL — ABNORMAL LOW (ref 8.9–10.3)
Chloride: 108 mmol/L (ref 101–111)
Chloride: 106 mmol/L (ref 101–111)
Creatinine, Ser: 0.8 mg/dL (ref 0.44–1.00)
GFR calc Af Amer: >60 mL/min (ref >60)
GFR calc Af Amer: >60 mL/min (ref >60)
GFR calc Af Amer: >60 mL/min (ref >60)
GFR calc non Af Amer: >60 mL/min (ref >60)
GFR calc non Af Amer: >60 mL/min (ref >60)
GFR calc non Af Amer: >60 mL/min (ref >60)
GLUCOSE: 85 mg/dL (ref 65–99)
GLUCOSE: 89 mg/dL (ref 65–99)
Glucose, Bld: 91 mg/dL (ref 65–99)
Glucose, Bld: 84 mg/dL (ref 65–99)
Glucose, Bld: 89 mg/dL (ref 65–99)
POTASSIUM: 3.7 mmol/L (ref 3.5–5.1)
POTASSIUM: 3.9 mmol/L (ref 3.5–5.1)
Potassium: 3.5 mmol/L (ref 3.5–5.1)
Potassium: 3.8 mmol/L (ref 3.5–5.1)
Potassium: 3.8 mmol/L (ref 3.5–5.1)
SODIUM: 137 mmol/L (ref 135–145)
Sodium: 135 mmol/L (ref 135–145)
Sodium: 135 mmol/L (ref 135–145)
Sodium: 137 mmol/L (ref 135–145)
Sodium: 135 mmol/L (ref 135–145)
TOTAL PROTEIN: 4.4 g/dL — AB (ref 6.5–8.1)
TOTAL PROTEIN: 4.7 g/dL — AB (ref 6.5–8.1)
TOTAL PROTEIN: 5.3 g/dL — AB (ref 6.5–8.1)
Total Bilirubin: 2.2 mg/dL — ABNORMAL HIGH (ref 0.3–1.2)
Total Bilirubin: 2.3 mg/dL — ABNORMAL HIGH (ref 0.3–1.2)
Total Bilirubin: 3.8 mg/dL — ABNORMAL HIGH (ref 0.3–1.2)
Total Bilirubin: 4 mg/dL — ABNORMAL HIGH (ref 0.3–1.2)
Total Protein: 4.4 g/dL — ABNORMAL LOW (ref 6.5–8.1)
Total Protein: 5.2 g/dL — ABNORMAL LOW (ref 6.5–8.1)

## 2018-01-18 LAB — CBC
HCT: 29.8 % — ABNORMAL LOW (ref 36.0–46.0)
HEMATOCRIT: 26.9 % — AB (ref 36.0–46.0)
HEMATOCRIT: 26.8 % — AB (ref 36.0–46.0)
HEMATOCRIT: 26.8 % — AB (ref 36.0–46.0)
HEMATOCRIT: 27.4 % — AB (ref 36.0–46.0)
HEMOGLOBIN: 9.2 g/dL — AB (ref 12.0–15.0)
HEMOGLOBIN: 9.5 g/dL — AB (ref 12.0–15.0)
HEMOGLOBIN: 9.6 g/dL — AB (ref 12.0–15.0)
HEMOGLOBIN: 10.5 g/dL — AB (ref 12.0–15.0)
Hemoglobin: 9.8 g/dL — ABNORMAL LOW (ref 12.0–15.0)
MCH: 32.1 pg (ref 26.0–34.0)
MCH: 32.6 pg (ref 26.0–34.0)
MCH: 33 pg (ref 26.0–34.0)
MCH: 32.4 pg (ref 26.0–34.0)
MCH: 32.8 pg (ref 26.0–34.0)
MCHC: 34.2 g/dL (ref 30.0–36.0)
MCHC: 35.4 g/dL (ref 30.0–36.0)
MCHC: 35.8 g/dL (ref 30.0–36.0)
MCHC: 35.2 g/dL (ref 30.0–36.0)
MCHC: 35.8 g/dL (ref 30.0–36.0)
MCV: 93.7 fL (ref 78.0–100.0)
MCV: 92.1 fL (ref 78.0–100.0)
MCV: 92.1 fL (ref 78.0–100.0)
MCV: 92 fL (ref 78.0–100.0)
MCV: 91.6 fL (ref 78.0–100.0)
PLATELETS: 23 10*3/uL — AB (ref 150–400)
PLATELETS: 23 10*3/uL — AB (ref 150–400)
Platelets: 26 10*3/uL — CL (ref 150–400)
Platelets: 26 10*3/uL — CL (ref 150–400)
Platelets: 26 10*3/uL — CL (ref 150–400)
RBC: 2.87 MIL/uL — ABNORMAL LOW (ref 3.87–5.11)
RBC: 2.91 MIL/uL — AB (ref 3.87–5.11)
RBC: 2.91 MIL/uL — ABNORMAL LOW (ref 3.87–5.11)
RBC: 3.24 MIL/uL — AB (ref 3.87–5.11)
RBC: 2.99 MIL/uL — AB (ref 3.87–5.11)
RDW: 14.7 % (ref 11.5–15.5)
RDW: 15 % (ref 11.5–15.5)
RDW: 15.1 % (ref 11.5–15.5)
RDW: 15.1 % (ref 11.5–15.5)
RDW: 15.2 % (ref 11.5–15.5)
WBC: 11.8 10*3/uL — ABNORMAL HIGH (ref 4.0–10.5)
WBC: 10.8 10*3/uL — ABNORMAL HIGH (ref 4.0–10.5)
WBC: 11.4 10*3/uL — ABNORMAL HIGH (ref 4.0–10.5)
WBC: 15.2 10*3/uL — ABNORMAL HIGH (ref 4.0–10.5)
WBC: 13.1 10*3/uL — AB (ref 4.0–10.5)

## 2018-01-18 MED ORDER — HYDRALAZINE HCL 20 MG/ML IJ SOLN: 10.0000 mg | Freq: Once | INTRAMUSCULAR | Status: DC

## 2018-01-18 MED ORDER — HYDRALAZINE HCL 20 MG/ML IJ SOLN
10.0000 mg | INTRAMUSCULAR | Status: DC | PRN
  Administered 2018-01-18 – 2018-01-20 (×8): 10 mg via INTRAVENOUS
  Filled 2018-01-18 (×7): qty 1

## 2018-01-18 MED ORDER — ENALAPRIL MALEATE 10 MG PO TABS
10.0000 mg | ORAL_TABLET | Freq: Every day | ORAL | Status: DC
  Administered 2018-01-18 – 2018-01-19 (×2): 10 mg via ORAL
  Filled 2018-01-18 (×3): qty 1

## 2018-01-18 MED ORDER — OXYCODONE HCL 5 MG PO TABS
5.0000 mg | ORAL_TABLET | ORAL | Status: DC | PRN
  Administered 2018-01-18 – 2018-01-20 (×8): 5 mg via ORAL
  Filled 2018-01-18 (×8): qty 1

## 2018-01-18 MED ORDER — HYDRALAZINE HCL 20 MG/ML IJ SOLN
INTRAMUSCULAR | Status: AC
  Filled 2018-01-18: qty 1

## 2018-01-18 MED ORDER — HYDROMORPHONE HCL 1 MG/ML IJ SOLN: 1.0000 mg | INTRAMUSCULAR | Status: DC | PRN

## 2018-01-18 NOTE — Progress Notes (Signed)
Patient stated she us feeling better, and was able to shower.

## 2018-01-18 NOTE — Progress Notes (Signed)
CRITICAL VALUE ALERT  Critical Value: platelets 23   Date & Time Notied: 01/18/18 13:51  Provider Notified: Dr Macon LargeAnyanwu  Orders Received/Actions taken: no  Action at this time
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
## 2018-01-18 NOTE — Progress Notes (Signed)
CRITICAL VALUE ALERT  Critical Value: platelets 26   Date & Time Notied: 01-18-18  0210  Provider Notified: Dr. Vergie LivingPickens  Orders Received/Actions taken: Dr. Vergie LivingPickens will come evaluate patient in 30 minutes no further indication to do anything.
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
## 2018-01-18 NOTE — Progress Notes (Addendum)
Postpartum Note  Admission Date: 01/14/2018 Current Date: 01/18/2018 10:14 AM  Dominique Shea is a 27 y.o. [id number] PPD#2 s/p SVD after IOL for HELLP. Had worsening HELLP labs on PPD#1, was checked q 4 hours, seem to be stabilizing.  Pregnancy complicated by: Principal Problem:   Hemolysis, elevated liver enzymes, and low platelet (HELLP) syndrome during pregnancy, delivered, with postpartum complication Active Problems:   Thrombocytopenia (HCC)   Unwanted fertility   Spontaneous vaginal delivery   Postpartum care following vaginal delivery    Subjective:  Patient still reporting intermittent right upper abdominal and back pain. No N/V, heartburn. Denies headaches, visual symptoms.  Lochia is appropriate and otherwise overall feels fine. Bottlefeeding. Baby doing well in NICU.  Objective:    Current Vital Signs 24h Vital Sign Ranges  T 98.6 F (37 C) Temp  Avg: 98.6 F (37 C)  Min: 97.6 F (36.4 C)  Max: 99.3 F (37.4 C)  BP (!) 154/95 BP  Min: 129/73  Max: 182/98  HR 66 Pulse  Avg: 71.9  Min: 54  Max: 90  RR 18 Resp  Avg: 17.4  Min: 16  Max: 18  SaO2 98 % Room Air SpO2  Avg: 97.6 %  Min: 96 %  Max: 100 %       24 Hour I/O Current Shift I/O  Time Ins Outs 04/23 0701 - 04/24 0700 In: -  Out: 825 [Urine:825] No intake/output data recorded.   Patient Vitals for the past 24 hrs:  BP Temp Temp src Pulse Resp SpO2  01/18/18 0929 (!) 154/95 98.6 F (37 C) Oral 66 18 98 %  01/18/18 0344 (!) 142/89 98.8 F (37.1 C) Oral 72 16 96 %  01/18/18 0131 129/73 - - 70 - -  01/18/18 0114 137/83 - - 72 - -  01/18/18 0101 (!) 161/98 - - 66 - -  01/18/18 0046 (!) 147/96 - - 76 - -  01/18/18 0045 (!) 161/95 - - 68 - -  01/18/18 0038 (!) 168/101 - - 80 - -  01/18/18 0022 (!) 155/96 - - - - -  01/18/18 0008 (!) 151/106 98.5 F (36.9 C) Oral 70 17 97 %  01/17/18 2214 135/82 - - 66 - -  01/17/18 2051 (!) 138/95 - - 71 - -  01/17/18 1950 (!) 151/97 - - 66 - -  01/17/18 1700 133/86 - -  90 - -  01/17/18 1630 (!) 147/91 - - 79 - -  01/17/18 1551 (!) 138/92 99.3 F (37.4 C) Oral 78 18 97 %  01/17/18 1430 (!) 151/89 - - 81 - -  01/17/18 1400 (!) 144/93 - - 74 - -  01/17/18 1330 (!) 141/91 - - 69 - -  01/17/18 1300 (!) 174/104 - - 70 - -  01/17/18 1246 (!) 175/99 - - - - -  01/17/18 1205 (!) 182/98 97.6 F (36.4 C) Oral (!) 54 18 100 %    Physical exam: General: alert, oriented, cooperative Chest: CTAB, normal respiratory effort Heart: RRR  Abdomen: soft, appropriately tender to palpation, +RUQ tenderness Back: Upper right tenderness to palpation Uterine Fundus: firm, 2 fingers below the umbilicus, NT Lochia: moderate, rubra DVT Evaluation: no evidence of DVT Extremities: no edema, no calf tenderness    Medications: Current Facility-Administered Medications  Medication Dose Route Frequency Provider Last Rate Last Dose  . 0.9 %  sodium chloride infusion   Intravenous Continuous Conan Bowens, MD 75 mL/hr at 01/17/18 0206    .  amLODipine (NORVASC) tablet 10 mg  10 mg Oral Daily Tyrone Balash A, MD   10 mg at 01/18/18 0934  . benzocaine-Menthol (DERMOPLAST) 20-0.5 % topical spray 1 application  1 application Topical PRN Raelyn Moraawson, Rolitta, CNM      . coconut oil  1 application Topical PRN Raelyn Moraawson, Rolitta, CNM      . witch hazel-glycerin (TUCKS) pad 1 application  1 application Topical PRN Raelyn Moraawson, Rolitta, CNM       And  . dibucaine (NUPERCAINAL) 1 % rectal ointment 1 application  1 application Rectal PRN Raelyn Moraawson, Rolitta, CNM      . HYDROmorphone (DILAUDID) injection 1-2 mg  1-2 mg Intravenous Q3H PRN Justice Milliron A, MD      . ondansetron (ZOFRAN) tablet 4 mg  4 mg Oral Q4H PRN Raelyn Moraawson, Rolitta, CNM   4 mg at 01/17/18 0053   Or  . ondansetron (ZOFRAN) injection 4 mg  4 mg Intravenous Q4H PRN Raelyn Moraawson, Rolitta, CNM      . oxyCODONE (Oxy IR/ROXICODONE) immediate release tablet 5 mg  5 mg Oral Q4H PRN Blu Lori A, MD      . potassium chloride (K-DUR,KLOR-CON) CR  tablet 30 mEq  30 mEq Oral BID AC [place] BingPickens, Charlie, MD   30 mEq at 01/18/18 0745  . prenatal multivitamin tablet 1 tablet  1 tablet Oral Q1200 Raelyn Moraawson, Rolitta, CNM   1 tablet at 01/18/18 0934  . senna-docusate (Senokot-S) tablet 2 tablet  2 tablet Oral QHS PRN [place] BingPickens, Charlie, MD      . simethicone (MYLICON) chewable tablet 80 mg  80 mg Oral PRN Raelyn Moraawson, Rolitta, CNM      . Tdap (BOOSTRIX) injection 0.5 mL  0.5 mL Intramuscular Once Raelyn MoraDawson, Rolitta, CNM        Labs:  Recent Labs  Lab 01/17/18 2117 01/18/18 0145 01/18/18 0558  WBC 13.7* 11.8* 10.8*  HGB 10.1* 9.2* 9.5*  HCT 29.2* 26.9* 26.8*  PLT 38* 26* 26*    Recent Labs  Lab 01/18/18 0145 01/18/18 0558 01/18/18 0850  NA 135 135 137  K 3.5 3.7 3.8  CL 106 107 108  CO2 21* 21* 22  BUN 17 17 15   CREATININE 0.87 0.80 0.80  CALCIUM 6.9* 7.0* 7.2*  PROT 4.4* 4.4* 4.7*  BILITOT 2.2* 2.3* 2.7*  ALKPHOS 56 56 58  ALT 135* 137* 142*  AST 264* 259* 248*  GLUCOSE 91 85 84    Radiology: [place] Liver Doppler  Result Date: 01/17/2018 CLINICAL DATA:  27 y/o  F; EXAM: DUPLEX ULTRASOUND OF LIVER TECHNIQUE: Color and duplex Doppler ultrasound was performed to evaluate the hepatic in-flow and out-flow vessels. COMPARISON:  None. FINDINGS: Portal Vein Main: Patent with normal direction of flow. Waveform within normal limits. Right: Patent with normal direction of flow. Waveform within normal limits. Left: Patent with normal direction of flow. Waveform within normal limits. Hepatic Vein Right: Patent with normal direction of flow. Waveform within normal limits. Middle: Patent with normal direction of flow. Waveform within normal limits. Left: Patent with normal direction of flow. Waveform within normal limits. Splenic Vein Velocity: Patent with normal direction of flow. Waveform within normal limits. Varices: None visualized. Ascites: None visualized. Small right pleural effusion. IMPRESSION: 1. Patent portal venous and hepatic venous systems  without evidence for thrombosis. Waveforms within normal limits. 2. Small right pleural effusion. Electronically Signed   By: Dominique HansenLance  Furusawa-Stratton M.D.   On: 01/17/2018 19:21    Assessment & Plan:  PPD#2 s/p SVD after IOL  for HELLP, still has abnormal labs *HELLP: Will continue to follow serial labs. Normal coags and liver ultrasound. Continue Norvasc for BP control, and can add more medication if needed. Continue inpatient observation. No need for transfusion. Analgesia as needed for pain.  *PPx: SCDs, OOB *FEN/GI: Regular diet.  *Postpartum: Bottlefeeding, desires interval BTL. Continue routine antenatal care.   Jaynie Collins, MD, FACOG Obstetrician & Gynecologist, [facility] for [employer], [facility]

## 2018-01-18 NOTE — Lactation Note (Signed)
This note was copied from a baby's chart. Lactation Consultation Note  Patient Name: [name] WUJWJ'XToday's Date: 01/18/2018 Reason for consult: Follow-up assessment;NICU baby;Preterm <34wks;Infant < 6lbs   Follow up with mom of 48 hour old NICU infant. Mom has pump in the room. Mom reports she has not pumped today. Enc mom to pump 8-12 x in 24 hours and follow with hand expression. Mom reports she knows how to hand express and is getting a little out of the right breast.   Mom reports she has no questions/concerns at this time. Mom to call out for feeding assistance as needed.    Maternal Data Formula Feeding for Exclusion: No Has patient been taught Hand Expression?: Yes  Feeding    LATCH Score                   Interventions    Lactation Tools Discussed/Used Initiated by:: reviewed and encouraged 8-12 x a day   Consult Status Consult Status: Follow-up Date: 01/19/18 Follow-up type: In-patient    Silas FloodSharon S Fynley Chrystal 01/18/2018, 9:18 AM

## 2018-01-18 NOTE — Progress Notes (Signed)
CRITICAL VALUE ALERT  Critical Value: platelets 26  Date & Time Notied: 01-18-18 @ [id number]   Provider Notified: Dr. Vergie LivingPickens    Orders Received/Actions taken: Keep monitoring patient. Dr. Dewayne HatchWants to keep plts above 20 with no active bleeding. Labs will stay q4h.

## 2018-01-18 NOTE — Progress Notes (Signed)
CRITICAL VALUE ALERT  Critical Value:  26  Date & Time Notied:  01/18/18 1055  Provider Notified: Dr Macon Largeanyanwu  Orders Received/Actions taken: new lab work ordered

## 2018-01-19 LAB — COMPREHENSIVE METABOLIC PANEL
ALT: 129 U/L — AB (ref 14–54)
AST: 159 U/L — ABNORMAL HIGH (ref 15–41)
Albumin: 2.3 g/dL — ABNORMAL LOW (ref 3.5–5.0)
Alkaline Phosphatase: 60 U/L (ref 38–126)
Anion gap: 10 (ref 5–15)
BILIRUBIN TOTAL: 3.3 mg/dL — AB (ref 0.3–1.2)
BUN: 10 mg/dL (ref 6–20)
CHLORIDE: 105 mmol/L (ref 101–111)
CO2: 21 mmol/L — ABNORMAL LOW (ref 22–32)
CREATININE: 0.59 mg/dL (ref 0.44–1.00)
Calcium: 8 mg/dL — ABNORMAL LOW (ref 8.9–10.3)
GFR calc Af Amer: >60 mL/min (ref >60)
GLUCOSE: 91 mg/dL (ref 65–99)
Potassium: 3.8 mmol/L (ref 3.5–5.1)
Sodium: 136 mmol/L (ref 135–145)
TOTAL PROTEIN: 4.9 g/dL — AB (ref 6.5–8.1)

## 2018-01-19 LAB — CBC
HEMATOCRIT: 26.4 % — AB (ref 36.0–46.0)
Hemoglobin: 9.4 g/dL — ABNORMAL LOW (ref 12.0–15.0)
MCH: 32.5 pg (ref 26.0–34.0)
MCHC: 35.6 g/dL (ref 30.0–36.0)
MCV: 91.3 fL (ref 78.0–100.0)
Platelets: 26 10*3/uL — CL (ref 150–400)
RBC: 2.89 MIL/uL — AB (ref 3.87–5.11)
RDW: 15.4 % (ref 11.5–15.5)
WBC: 10.8 10*3/uL — AB (ref 4.0–10.5)

## 2018-01-19 NOTE — Progress Notes (Addendum)
Postpartum Note  Admission Date: 01/14/2018 Current Date: 01/19/2018 11:08 AM  Dominique Shea is a 27 y.o. [id number] PPD#3 s/p SVD after IOL for HELLP. Had worsening HELLP labs on PPD#1-2, was checked  frequently, seem to be stabilizing.  Pregnancy complicated by: Principal Problem:   Hemolysis, elevated liver enzymes, and low platelet (HELLP) syndrome during pregnancy, delivered, with postpartum complication Active Problems:   Thrombocytopenia (HCC)   Unwanted fertility   Spontaneous vaginal delivery   Postpartum care following vaginal delivery    Subjective:  Patient reports feeling much better. Denies  right upper abdominal and back pain. No N/V, heartburn. Denies headaches, visual symptoms.  Lochia is appropriate and otherwise overall feels fine. Bottlefeeding. Baby doing well in NICU.  Objective:    Current Vital Signs 24h Vital Sign Ranges  T 99.2 F (37.3 C) Temp  Avg: 98.5 F (36.9 C)  Min: 97.9 F (36.6 C)  Max: 99.2 F (37.3 C)  BP (!) 146/83 BP  Min: 139/78  Max: 185/110  HR 78 Pulse  Avg: 75.4  Min: 62  Max: 90  RR 18 Resp  Avg: 17  Min: 16  Max: 18  SaO2 99 % Room Air SpO2  Avg: 98.1 %  Min: 96 %  Max: 100 %       24 Hour I/O Current Shift I/O  Time Ins Outs 04/24 0701 - 04/25 0700 In: -  Out: 1  No intake/output data recorded.   Patient Vitals for the past 24 hrs:  BP Temp Temp src Pulse Resp SpO2  01/19/18 0805 (!) 146/83 99.2 F (37.3 C) Oral 78 18 99 %  01/19/18 0434 (!) 141/86 98.8 F (37.1 C) Oral 74 16 98 %  01/19/18 0000 (!) 159/100 98.3 F (36.8 C) Oral 90 17 98 %  01/18/18 2130 (!) 158/93 - - 79 - -  01/18/18 2120 (!) 181/93 - - 78 - -  01/18/18 2051 (!) 163/121 98.2 F (36.8 C) Oral 69 16 100 %  01/18/18 1621 (!) 155/95 98.1 F (36.7 C) - 84 18 99 %  01/18/18 1552 139/78 98.9 F (37.2 C) Oral 79 16 -  01/18/18 1551 - - - - - 96 %  01/18/18 1331 (!) 159/95 - - 71 - -  01/18/18 1319 (!) 185/110 - - 65 - -  01/18/18 1151 (!) 167/111 - -  62 - -  01/18/18 1146 (!) 162/116 97.9 F (36.6 C) Oral 76 18 97 %  UOP: at least 50-475mL/hr  Physical exam: General: alert, oriented, cooperative Chest: CTAB, normal respiratory effort Heart: RRR  Abdomen: soft, appropriately tender to palpation  Uterine Fundus: firm, 2 fingers below the umbilicus Lochia: moderate, rubra DVT Evaluation: no evidence of DVT Extremities: no edema, no calf tenderness  Medications: Current Facility-Administered Medications  Medication Dose Route Frequency Provider Last Rate Last Dose  . 0.9 %  sodium chloride infusion   Intravenous Continuous Conan Bowensavis, Kelly M, MD 75 mL/hr at 01/17/18 0206    . amLODipine (NORVASC) tablet 10 mg  10 mg Oral Daily Manfred Laspina A, MD   10 mg at 01/19/18 0957  . benzocaine-Menthol (DERMOPLAST) 20-0.5 % topical spray 1 application  1 application Topical PRN Raelyn Moraawson, Rolitta, CNM      . coconut oil  1 application Topical PRN Raelyn Moraawson, Rolitta, CNM      . witch hazel-glycerin (TUCKS) pad 1 application  1 application Topical PRN Raelyn Moraawson, Rolitta, CNM       And  . dibucaine (NUPERCAINAL)  1 % rectal ointment 1 application  1 application Rectal PRN Raelyn Mora, CNM      . enalapril (VASOTEC) tablet 10 mg  10 mg Oral Daily Tehilla Coffel A, MD   10 mg at 01/19/18 0958  . hydrALAZINE (APRESOLINE) injection 10 mg  10 mg Intravenous Q2H PRN Wilfredo Canterbury, Jethro Bastos, MD   10 mg at 01/18/18 2120  . HYDROmorphone (DILAUDID) injection 1-2 mg  1-2 mg Intravenous Q3H PRN Desmon Hitchner A, MD      . ondansetron (ZOFRAN) tablet 4 mg  4 mg Oral Q4H PRN Raelyn Mora, CNM   4 mg at 01/17/18 0053   Or  . ondansetron (ZOFRAN) injection 4 mg  4 mg Intravenous Q4H PRN Raelyn Mora, CNM      . oxyCODONE (Oxy IR/ROXICODONE) immediate release tablet 5 mg  5 mg Oral Q4H PRN Favian Kittleson, Jethro Bastos, MD   5 mg at 01/19/18 0055  . potassium chloride (K-DUR,KLOR-CON) CR tablet 30 mEq  30 mEq Oral BID AC [place] Bing, MD   30 mEq at 01/19/18 0957  . prenatal  multivitamin tablet 1 tablet  1 tablet Oral Q1200 Raelyn Mora, CNM   1 tablet at 01/18/18 0934  . senna-docusate (Senokot-S) tablet 2 tablet  2 tablet Oral QHS PRN Powell Bing, MD      . simethicone (MYLICON) chewable tablet 80 mg  80 mg Oral PRN Raelyn Mora, CNM      . Tdap (BOOSTRIX) injection 0.5 mL  0.5 mL Intramuscular Once Raelyn Mora, CNM        Labs:  Recent Labs  Lab 01/18/18 1310 01/18/18 2112 01/19/18 0538  WBC 15.2* 13.1* 10.8*  HGB 10.5* 9.8* 9.4*  HCT 29.8* 27.4* 26.4*  PLT 23* 23* 26*    Recent Labs  Lab 01/18/18 1310 01/18/18 2112 01/19/18 0538  NA 135 137 136  K 3.9 3.8 3.8  CL 107 106 105  CO2 21* 22 21*  BUN 16 14 10   CREATININE 0.77 0.71 0.59  CALCIUM 7.9* 8.1* 8.0*  PROT 5.2* 5.3* 4.9*  BILITOT 3.8* 4.0* 3.3*  ALKPHOS 65 64 60  ALT 163* 153* 129*  AST 259* 226* 159*  GLUCOSE 89 89 91    Radiology: No results found.  Assessment & Plan:  PPD#3 s/p SVD after IOL for HELLP *HELLP: Stabilizing labs. Normal coags and liver ultrasound. Continue Norvasc and Enalapril for BP control, and can add more medication if needed. Continue inpatient observation.  *PPx: SCDs, OOB *FEN/GI: Regular diet.  *Postpartum: Bottlefeeding, desires interval BTL. Continue routine antenatal care. *Disposition: May be able to go home tomorrow with close outpatient follow up.   Jaynie Collins, MD, FACOG Obstetrician & Gynecologist, [facility] for [employer], [facility]

## 2018-01-19 NOTE — Lactation Note (Signed)
This note was copied from a baby's chart. Lactation Consultation Note  Patient Name: Dominique Shea: 01/19/2018 Reason for consult: Follow-up assessment;Preterm <34wks;Infant < 6lbs;NICU baby   Follow up with mom of 4773 hour old NICU infant. Mom reports she is pumping and hand expressing. She is not getting colostrum at this time. Mom declined need for assistance today. Mom reports she has no questions/concerns . Enc mom to continue pumping 8-12 x in 24 hours and follow with hand expression.    Maternal Data Formula Feeding for Exclusion: No Has patient been taught Hand Expression?: Yes  Feeding    LATCH Score                   Interventions    Lactation Tools Discussed/Used Pump Review: Setup, frequency, and cleaning Initiated by:: Reviewed and encouraged 8-12 x in 24 hours   Consult Status Consult Status: Follow-up Shea: 01/20/18 Follow-up type: In-patient    Silas FloodSharon S Charnay Nazario 01/19/2018, 10:28 AM

## 2018-01-20 ENCOUNTER — Telehealth: Payer: Self-pay | Admitting: Obstetrics and Gynecology

## 2018-01-20 ENCOUNTER — Inpatient Hospital Stay (HOSPITAL_COMMUNITY): Payer: Medicaid Other

## 2018-01-20 ENCOUNTER — Inpatient Hospital Stay: Payer: Medicaid Other | Admitting: Oncology

## 2018-01-20 ENCOUNTER — Inpatient Hospital Stay: Payer: Medicaid Other

## 2018-01-20 LAB — COMPREHENSIVE METABOLIC PANEL
ALBUMIN: 3 g/dL — AB (ref 3.5–5.0)
ALK PHOS: 90 U/L (ref 38–126)
ALK PHOS: 95 U/L (ref 38–126)
ALT: 161 U/L — ABNORMAL HIGH (ref 14–54)
ALT: 139 U/L — AB (ref 14–54)
ALT: 124 U/L — AB (ref 14–54)
ANION GAP: 11 (ref 5–15)
AST: 203 U/L — ABNORMAL HIGH (ref 15–41)
AST: 128 U/L — AB (ref 15–41)
AST: 96 U/L — AB (ref 15–41)
Albumin: 3 g/dL — ABNORMAL LOW (ref 3.5–5.0)
Albumin: 3 g/dL — ABNORMAL LOW (ref 3.5–5.0)
Alkaline Phosphatase: 94 U/L (ref 38–126)
Anion gap: 12 (ref 5–15)
Anion gap: 12 (ref 5–15)
BILIRUBIN TOTAL: 6.8 mg/dL — AB (ref 0.3–1.2)
BILIRUBIN TOTAL: 5.6 mg/dL — AB (ref 0.3–1.2)
BILIRUBIN TOTAL: 4.7 mg/dL — AB (ref 0.3–1.2)
BUN: 12 mg/dL (ref 6–20)
BUN: 14 mg/dL (ref 6–20)
BUN: 13 mg/dL (ref 6–20)
CALCIUM: 8.9 mg/dL (ref 8.9–10.3)
CALCIUM: 8.7 mg/dL — AB (ref 8.9–10.3)
CHLORIDE: 99 mmol/L — AB (ref 101–111)
CO2: 22 mmol/L (ref 22–32)
CO2: 21 mmol/L — ABNORMAL LOW (ref 22–32)
CO2: 23 mmol/L (ref 22–32)
CREATININE: 0.76 mg/dL (ref 0.44–1.00)
CREATININE: 0.72 mg/dL (ref 0.44–1.00)
Calcium: 8.4 mg/dL — ABNORMAL LOW (ref 8.9–10.3)
Chloride: 104 mmol/L (ref 101–111)
Chloride: 101 mmol/L (ref 101–111)
Creatinine, Ser: 0.69 mg/dL (ref 0.44–1.00)
GFR calc Af Amer: >60 mL/min (ref >60)
GFR calc Af Amer: >60 mL/min (ref >60)
GFR calc non Af Amer: >60 mL/min (ref >60)
GFR calc non Af Amer: >60 mL/min (ref >60)
GLUCOSE: 107 mg/dL — AB (ref 65–99)
Glucose, Bld: 99 mg/dL (ref 65–99)
Glucose, Bld: 159 mg/dL — ABNORMAL HIGH (ref 65–99)
POTASSIUM: 4 mmol/L (ref 3.5–5.1)
Potassium: 3.5 mmol/L (ref 3.5–5.1)
Potassium: 3.7 mmol/L (ref 3.5–5.1)
SODIUM: 137 mmol/L (ref 135–145)
Sodium: 134 mmol/L — ABNORMAL LOW (ref 135–145)
Sodium: 134 mmol/L — ABNORMAL LOW (ref 135–145)
TOTAL PROTEIN: 6.2 g/dL — AB (ref 6.5–8.1)
TOTAL PROTEIN: 6.5 g/dL (ref 6.5–8.1)
Total Protein: 5.8 g/dL — ABNORMAL LOW (ref 6.5–8.1)

## 2018-01-20 LAB — CBC
HCT: 30.1 % — ABNORMAL LOW (ref 36.0–46.0)
HEMATOCRIT: 28.8 % — AB (ref 36.0–46.0)
HEMATOCRIT: 27.1 % — AB (ref 36.0–46.0)
HEMOGLOBIN: 10.7 g/dL — AB (ref 12.0–15.0)
HEMOGLOBIN: 10.1 g/dL — AB (ref 12.0–15.0)
Hemoglobin: 9.5 g/dL — ABNORMAL LOW (ref 12.0–15.0)
MCH: 32.3 pg (ref 26.0–34.0)
MCH: 32 pg (ref 26.0–34.0)
MCH: 31.9 pg (ref 26.0–34.0)
MCHC: 35.5 g/dL (ref 30.0–36.0)
MCHC: 35.1 g/dL (ref 30.0–36.0)
MCHC: 35.1 g/dL (ref 30.0–36.0)
MCV: 90.9 fL (ref 78.0–100.0)
MCV: 91.1 fL (ref 78.0–100.0)
MCV: 90.9 fL (ref 78.0–100.0)
Platelets: 34 10*3/uL — ABNORMAL LOW (ref 150–400)
Platelets: 21 10*3/uL — CL (ref 150–400)
Platelets: 29 10*3/uL — CL (ref 150–400)
RBC: 3.31 MIL/uL — ABNORMAL LOW (ref 3.87–5.11)
RBC: 3.16 MIL/uL — AB (ref 3.87–5.11)
RBC: 2.98 MIL/uL — AB (ref 3.87–5.11)
RDW: 15.6 % — AB (ref 11.5–15.5)
RDW: 15.9 % — ABNORMAL HIGH (ref 11.5–15.5)
RDW: 16 % — ABNORMAL HIGH (ref 11.5–15.5)
WBC: 14.3 10*3/uL — ABNORMAL HIGH (ref 4.0–10.5)
WBC: 14.3 10*3/uL — AB (ref 4.0–10.5)
WBC: 14.7 10*3/uL — AB (ref 4.0–10.5)

## 2018-01-20 LAB — DIC (DISSEMINATED INTRAVASCULAR COAGULATION) PANEL
PROTHROMBIN TIME: 12.4 s (ref 11.4–15.2)
SMEAR REVIEW: NONE SEEN

## 2018-01-20 LAB — DIC (DISSEMINATED INTRAVASCULAR COAGULATION)PANEL
D-Dimer, Quant: 11.12 ug/mL-FEU — ABNORMAL HIGH (ref 0.00–0.50)
Fibrinogen: 439 mg/dL (ref 210–475)
INR: 0.93
Platelets: 28 10*3/uL — CL (ref 150–400)
aPTT: 34 seconds (ref 24–36)

## 2018-01-20 LAB — BRAIN NATRIURETIC PEPTIDE: B Natriuretic Peptide: 61.6 pg/mL (ref 0.0–100.0)

## 2018-01-20 MED ORDER — MAGNESIUM SULFATE 40 G IN LACTATED RINGERS - SIMPLE
2.0000 g/h | INTRAVENOUS | Status: DC
  Administered 2018-01-21: 2 g/h via INTRAVENOUS
  Filled 2018-01-20: qty 500
  Filled 2018-01-20: qty 40

## 2018-01-20 MED ORDER — LACTATED RINGERS IV SOLN
INTRAVENOUS | Status: DC
  Administered 2018-01-20: 11:00:00 via INTRAVENOUS

## 2018-01-20 MED ORDER — FUROSEMIDE 10 MG/ML IJ SOLN
20.0000 mg | Freq: Once | INTRAMUSCULAR | Status: AC
  Administered 2018-01-20: 20 mg via INTRAVENOUS
  Filled 2018-01-20: qty 2

## 2018-01-20 MED ORDER — HYDRALAZINE HCL 20 MG/ML IJ SOLN: 20.0000 mg | INTRAMUSCULAR | Status: DC | PRN

## 2018-01-20 MED ORDER — HYDROCHLOROTHIAZIDE 25 MG PO TABS
25.0000 mg | ORAL_TABLET | Freq: Every day | ORAL | Status: DC
Start: 2018-01-20 — End: 2018-01-21
  Administered 2018-01-20 – 2018-01-21 (×2): 25 mg via ORAL
  Filled 2018-01-20 (×2): qty 1

## 2018-01-20 MED ORDER — ENALAPRIL MALEATE 10 MG PO TABS
10.0000 mg | ORAL_TABLET | Freq: Two times a day (BID) | ORAL | Status: DC
  Administered 2018-01-20 – 2018-01-21 (×3): 10 mg via ORAL
  Filled 2018-01-20 (×5): qty 1

## 2018-01-20 MED ORDER — HYDRALAZINE HCL 20 MG/ML IJ SOLN
10.0000 mg | Freq: Once | INTRAMUSCULAR | Status: AC
  Administered 2018-01-20: 10 mg via INTRAVENOUS
  Filled 2018-01-20: qty 1

## 2018-01-20 MED ORDER — HYDRALAZINE HCL 20 MG/ML IJ SOLN: 10.0000 mg | INTRAMUSCULAR | Status: DC | PRN

## 2018-01-20 MED ORDER — MAGNESIUM SULFATE BOLUS VIA INFUSION
4.0000 g | Freq: Once | INTRAVENOUS | Status: AC
  Administered 2018-01-20: 4 g via INTRAVENOUS
  Filled 2018-01-20: qty 500

## 2018-01-20 NOTE — Progress Notes (Signed)
Dr Ladean Rayaonstance notified about persistent elevated BP and increased edema, especially to face and eyes. Pt c/o headache and difficulty opening eyes due to swelling. Denies any SOB. New orders received for Lasix (see MAR). When RN in to reevaluate, pt found resting in bed. Will continue to monitor

## 2018-01-20 NOTE — Telephone Encounter (Signed)
Patient contacted after hour nurse line on 01/19/18 at 3:46pm . Caller stated she gave birth on the 22nd at [redacted] weeks pregnant. She is still in hospital but possibly being discharge tomorrow. Need appt to follow up on BP.

## 2018-01-20 NOTE — Progress Notes (Signed)
MD notified of elevated BP and headache. Told to give 10 mg hydralazine IV push and an additional dose of 5 mg oxycodone. Will continue to monitor.

## 2018-01-20 NOTE — Progress Notes (Signed)
CRITICAL VALUE ALERT  Critical Value: platelets 21  Date & Time Notied:  01/20/2018  Provider Notified: Dr Macon LargeAnyanwu  Orders Received/Actions taken: none at this time

## 2018-01-20 NOTE — Lactation Note (Signed)
This note was copied from a baby's chart. Lactation Consultation Note  Patient Name: Girl Darla LeschesChandler Sleep ZOXWR'UToday's Date: 01/20/2018 Reason for consult: Follow-up assessment(per mom has not pumped due to not feeling well - see LC note , comfirmed with RN ) Pecola LeisureBaby is 524 days old  Mom resting in bed, per  Mom eyes have been swollen, and hasn't pumped.  LC encouraged mom to call when she is ready to pump, LC or RN.  RN Victorino DikeJennifer confirmed why mom is not feeling well.    Maternal Data    Feeding    LATCH Score                   Interventions Interventions: DEBP  Lactation Tools Discussed/Used Tools: Pump Breast pump type: Double-Electric Breast Pump   Consult Status Consult Status: Follow-up Date: 01/21/18 Follow-up type: In-patient    Matilde SprangMargaret Ann Gisela Lea 01/20/2018, 4:30 PM

## 2018-01-20 NOTE — Progress Notes (Signed)
Postpartum Note  Admission Date: 01/14/2018 Current Date: 01/20/2018 1:09 PM  Dominique Shea is a 27 y.o. [id number] PPD#4 s/p SVD after IOL for HELLP Had worsening HELLP labs on PPD#1-2, was checked  frequently, seemed to be stabilizing on PPD#3, now getting worse again.  Also has received three doses of Hydralazine 10 mg IV since last night; also reported severe headache.  Pregnancy complicated by: Principal Problem:   Hemolysis, elevated liver enzymes, and low platelet (HELLP) syndrome during pregnancy, delivered, with postpartum complication Active Problems:   Thrombocytopenia (HCC)   Unwanted fertility   Spontaneous vaginal delivery   Postpartum care following vaginal delivery    Subjective:  Patient reports feeling worse with severe headache; ameliorated after BP treatment but still present. Denies  right upper abdominal and back pain. No N/V, heartburn. No visual symptoms.  Had facial swelling since last night, received Lasix 20 mg IV x 1 which helped.  Lochia is appropriate. Bottlefeeding. Baby doing well in NICU.  Objective:    Current Vital Signs 24h Vital Sign Ranges  T 97.7 F (36.5 C) Temp  Avg: 98.5 F (36.9 C)  Min: 97.7 F (36.5 C)  Max: 99.2 F (37.3 C)  BP (!) 152/106 BP  Min: 151/109  Max: 188/126  HR 95 Pulse  Avg: 91.9  Min: 76  Max: 104  RR 17 Resp  Avg: 17.2  Min: 16  Max: 18  SaO2 100 % Room Air SpO2  Avg: 98.7 %  Min: 97 %  Max: 100 %       24 Hour I/O Current Shift I/O  Time Ins Outs 04/25 0701 - 04/26 0700 In: 120 [P.O.:120] Out: 200 [Urine:200] 04/26 0701 - 04/26 1900 In: 192.1 [I.V.:192.1] Out: 300 [Urine:300]   Patient Vitals for the past 24 hrs:  BP Temp Temp src Pulse Resp SpO2  01/20/18 1238 - 97.7 F (36.5 C) Oral - - 100 %  01/20/18 1146 (!) 152/106 98.9 F (37.2 C) Oral 95 17 99 %  01/20/18 1121 (!) 154/104 - - 91 16 100 %  01/20/18 1112 (!) 153/99 - - 100 18 98 %  01/20/18 1100 (!) 151/109 - - 95 18 97 %  01/20/18 1011 (!) 168/123  - - (!) 104 - -  01/20/18 0901 (!) 164/113 - - 90 - -  01/20/18 0813 (!) 172/124 - - 88 - -  01/20/18 0755 (!) 163/123 99 F (37.2 C) - 91 18 100 %  01/20/18 0400 (!) 154/105 - - 94 - -  01/20/18 0345 (!) 152/90 - - 91 - -  01/20/18 0327 (!) 167/109 - - 93 - -  01/19/18 2330 (!) 154/101 - - 98 - -  01/19/18 2315 (!) 158/103 - - 97 - -  01/19/18 2255 (!) 165/111 - - 85 - -  01/19/18 2228 (!) 188/126 98.8 F (37.1 C) Oral 88 16 97 %  01/19/18 2128 (!) 180/123 97.9 F (36.6 C) Oral 96 18 98 %  01/19/18 1939 (!) 156/102 99.2 F (37.3 C) Oral 83 16 98 %  01/19/18 1719 (!) 158/101 98.2 F (36.8 C) Oral 76 18 100 %    Intake/Output Summary (Last 24 hours) at 01/20/2018 1309 Last data filed at 01/20/2018 1204 Gross per 24 hour  Intake 312.08 ml  Output 500 ml  Net -187.92 ml   Physical exam: General: alert, oriented, cooperative Chest: CTAB, normal respiratory effort Heart: RRR  Abdomen: soft, appropriately tender to palpation  Uterine Fundus: firm, 2 fingers  below the umbilicus Lochia: moderate, rubra DVT Evaluation: no evidence of DVT Extremities: no edema, no calf tenderness, 3+DTRs, no clonus  Medications: Current Facility-Administered Medications  Medication Dose Route Frequency Provider Last Rate Last Dose  . 0.9 %  sodium chloride infusion   Intravenous Continuous Conan Bowens, MD 75 mL/hr at 01/17/18 0206    . amLODipine (NORVASC) tablet 10 mg  10 mg Oral Daily Takenya Travaglini A, MD   10 mg at 01/20/18 1017  . benzocaine-Menthol (DERMOPLAST) 20-0.5 % topical spray 1 application  1 application Topical PRN Raelyn Mora, CNM      . coconut oil  1 application Topical PRN Raelyn Mora, CNM      . witch hazel-glycerin (TUCKS) pad 1 application  1 application Topical PRN Raelyn Mora, CNM       And  . dibucaine (NUPERCAINAL) 1 % rectal ointment 1 application  1 application Rectal PRN Raelyn Mora, CNM      . enalapril (VASOTEC) tablet 10 mg  10 mg Oral BID  Naysa Puskas A, MD   10 mg at 01/20/18 1016  . hydrALAZINE (APRESOLINE) injection 10 mg  10 mg Intravenous Q2H PRN Mabel Roll A, MD   10 mg at 01/20/18 0826  . hydrochlorothiazide (HYDRODIURIL) tablet 25 mg  25 mg Oral Daily Adelia Baptista A, MD   25 mg at 01/20/18 1016  . HYDROmorphone (DILAUDID) injection 1-2 mg  1-2 mg Intravenous Q3H PRN Alfreda Hammad A, MD      . lactated ringers infusion   Intravenous Continuous Amela Handley A, MD 100 mL/hr at 01/20/18 1116    . magnesium sulfate 40 grams in LR 500 mL OB infusion  2 g/hr Intravenous Continuous Kandie Keiper A, MD 25 mL/hr at 01/20/18 1121 2 g/hr at 01/20/18 1121  . ondansetron (ZOFRAN) tablet 4 mg  4 mg Oral Q4H PRN Raelyn Mora, CNM   4 mg at 01/17/18 0053   Or  . ondansetron (ZOFRAN) injection 4 mg  4 mg Intravenous Q4H PRN Raelyn Mora, CNM      . oxyCODONE (Oxy IR/ROXICODONE) immediate release tablet 5 mg  5 mg Oral Q4H PRN Naela Nodal, Jethro Bastos, MD   5 mg at 01/20/18 1191  . prenatal multivitamin tablet 1 tablet  1 tablet Oral Q1200 Raelyn Mora, CNM   1 tablet at 01/19/18 1311  . senna-docusate (Senokot-S) tablet 2 tablet  2 tablet Oral QHS PRN Drexel Bing, MD      . simethicone (MYLICON) chewable tablet 80 mg  80 mg Oral PRN Raelyn Mora, CNM      . Tdap (BOOSTRIX) injection 0.5 mL  0.5 mL Intramuscular Once Raelyn Mora, CNM        Labs:  Recent Labs  Lab 01/18/18 2112 01/19/18 0538 01/20/18 0557  WBC 13.1* 10.8* 14.3*  HGB 9.8* 9.4* 10.7*  HCT 27.4* 26.4* 30.1*  PLT 23* 26* 34*    Recent Labs  Lab 01/18/18 2112 01/19/18 0538 01/20/18 0557  NA 137 136 137  K 3.8 3.8 4.0  CL 106 105 104  CO2 22 21* 22  BUN 14 10 12   CREATININE 0.71 0.59 0.69  CALCIUM 8.1* 8.0* 8.9  PROT 5.3* 4.9* 6.2*  BILITOT 4.0* 3.3* 6.8*  ALKPHOS 64 60 90  ALT 153* 129* 161*  AST 226* 159* 203*  GLUCOSE 89 91 99    Radiology: No results found.  Assessment & Plan:  PPD#4 s/p SVD after IOL for  HELLP *HELLP:  - Given new  onset severe headache,  worsening BP and LFTs, magnesium sulfate eclampsia prophylaxis restarted.  Head CT scan also ordered, will follow up results and manage accordingly. - For BP control, Enalapril increased to 10 mg po bid, continue Norvasc 10 mg qd and HCTZ 25 mg qd added. Continue Hydralazine 10 mg IV prn. - LFTs increasing again. Normal coags and liver ultrasound on 4/23.  DIC panel, acute Hepatitis panel to be rechecked today. Will also recheck CBC and CMET.  Will continue inpatient observation.  *PPx: SCDs, OOB. No Lovenox due to thrombocytopenia. *FEN/GI: Regular diet.  *Postpartum: Bottlefeeding, desires interval BTL. Continue routine antenatal care. *Disposition: Unclear at this moment  Jaynie Collins, MD, FACOG Obstetrician & Gynecologist, [facility] for [employer], [facility]

## 2018-01-20 NOTE — Telephone Encounter (Signed)
Called and left voicemail for patient to call back.

## 2018-01-20 NOTE — Progress Notes (Addendum)
Nurse called to room as patient was complaining of numbness and tingling in her hands and feet. Patient was getting up to toilet and refused bedpan despite nurses strong suggestion. Bp taken 159/113. Dr Macon LargeAnyanwu notified of symptoms and BP. Then patient ambulated to bathroom with a steady gait. Pt to CT for previously ordered CT Head. Patient nurse and charge nurse accompanied pt to and from.

## 2018-01-21 DIAGNOSIS — O1425 HELLP syndrome, complicating the puerperium: Secondary | ICD-10-CM

## 2018-01-21 LAB — CBC
HCT: 26.9 % — ABNORMAL LOW (ref 36.0–46.0)
Hemoglobin: 9.6 g/dL — ABNORMAL LOW (ref 12.0–15.0)
MCH: 32.2 pg (ref 26.0–34.0)
MCHC: 35.7 g/dL (ref 30.0–36.0)
MCV: 90.3 fL (ref 78.0–100.0)
Platelets: 40 10*3/uL — ABNORMAL LOW (ref 150–400)
RBC: 2.98 MIL/uL — ABNORMAL LOW (ref 3.87–5.11)
RDW: 16.2 % — ABNORMAL HIGH (ref 11.5–15.5)
WBC: 14 10*3/uL — ABNORMAL HIGH (ref 4.0–10.5)

## 2018-01-21 LAB — COMPREHENSIVE METABOLIC PANEL
ALBUMIN: 3 g/dL — AB (ref 3.5–5.0)
ALK PHOS: 90 U/L (ref 38–126)
ALT: 105 U/L — ABNORMAL HIGH (ref 14–54)
AST: 65 U/L — ABNORMAL HIGH (ref 15–41)
Anion gap: 12 (ref 5–15)
BUN: 14 mg/dL (ref 6–20)
CALCIUM: 7.7 mg/dL — AB (ref 8.9–10.3)
CHLORIDE: 99 mmol/L — AB (ref 101–111)
CO2: 23 mmol/L (ref 22–32)
Creatinine, Ser: 0.75 mg/dL (ref 0.44–1.00)
GFR calc non Af Amer: >60 mL/min (ref >60)
GLUCOSE: 102 mg/dL — AB (ref 65–99)
Potassium: 3.8 mmol/L (ref 3.5–5.1)
SODIUM: 134 mmol/L — AB (ref 135–145)
Total Bilirubin: 2.8 mg/dL — ABNORMAL HIGH (ref 0.3–1.2)
Total Protein: 6.1 g/dL — ABNORMAL LOW (ref 6.5–8.1)

## 2018-01-21 LAB — HEPATITIS PANEL, ACUTE
HEP A IGM: NEGATIVE
HEP B C IGM: NEGATIVE
Hepatitis B Surface Ag: NEGATIVE

## 2018-01-21 MED ORDER — HYDROCHLOROTHIAZIDE 25 MG PO TABS: 25.0000 mg | ORAL_TABLET | Freq: Every day | ORAL | 2 refills | Status: DC

## 2018-01-21 MED ORDER — AMLODIPINE BESYLATE 10 MG PO TABS: 10.0000 mg | ORAL_TABLET | Freq: Every day | ORAL | 2 refills | Status: DC

## 2018-01-21 MED ORDER — ENALAPRIL MALEATE 10 MG PO TABS: 10.0000 mg | ORAL_TABLET | Freq: Two times a day (BID) | ORAL | 2 refills | Status: DC

## 2018-01-21 NOTE — Progress Notes (Signed)
Post Partum Day 5s/p SVD after IOL for HELLP Had worsening HELLP labs on PPD#1-2, was checked  frequently, seemed to be stabilizing on PPD#3, now getting worse again. Last 16 hour has been progress.   Subjective: no complaints and headache much better, down to 3 last pm, slept thru night. feels subjectively better  Objective: Blood pressure (!) 139/103, pulse (!) 102, temperature 97.8 F (36.6 C), temperature source Oral, resp. rate 16, height  (1.626 m), weight 175 lb (79.4 kg), last menstrual period 06/29/2017, SpO2 97 %, unknown if currently breastfeeding.   Temp:  [97.7 F (36.5 C)-98.9 F (37.2 C)] 97.8 F (36.6 C) (04/27 0340) Pulse Rate:  [88-134] 102 (04/27 0340) Resp:  [16-18] 16 (04/27 0340) BP: (126-173)/(88-124) 139/103 (04/27 0340) SpO2:  [97 %-100 %] 97 % (04/27 0340)  Physical Exam:  General: alert, appears stated age and no distress Lochia: appropriate Uterine Fundus: firm Incision:  DVT Evaluation: No evidence of DVT seen on physical exam. Reflexes 1+  Recent Labs    01/20/18 2011 01/21/18 0611  HGB 9.5* 9.6*  HCT 27.1* 26.9*   CBC Latest Ref Rng & Units 01/21/2018 01/20/2018 01/20/2018  WBC 4.0 - 10.5 K/uL 14.0(H) 14.7(H) -  Hemoglobin 12.0 - 15.0 g/dL 1.6(X) 0.9(U) -  Hematocrit 36.0 - 46.0 % 26.9(L) 27.1(L) -  Platelets 150 - 400 K/uL 40(L) 29(LL) 28(LL)    CMP Latest Ref Rng & Units 01/21/2018 01/20/2018 01/20/2018  Glucose 65 - 99 mg/dL 045(W) 098(J) 191(Y)  BUN 6 - 20 mg/dL Creatinine 0.44 - 1.00 mg/dL 7.82 9.56 2.13  Sodium 135 - 145 mmol/L 134(L) 134(L) 134(L)  Potassium 3.5 - 5.1 mmol/L 3.8 3.7 3.5  Chloride 101 - 111 mmol/L 99(L) 99(L) 101  CO2 22 - 32 mmol/L 23 23 21(L)  Calcium 8.9 - 10.3 mg/dL 7.7(L) 8.4(L) 8.7(L)  Total Protein 6.5 - 8.1 g/dL 6.1(L) 5.8(L) 6.5  Total Bilirubin 0.3 - 1.2 mg/dL 2.8(H) 4.7(H) 5.6(H)  Alkaline Phos 38 - 126 U/L 90 94 95  AST 15 - 41 U/L 65(H) 96(H) 128(H)  ALT 14 - 54 U/L 105(H) 124(H) 139(H)      Assessment/Plan: PPD 5 s/p HELLP restarted on MG ++ 10 am yesterday , now trending toward recovery D.c mag at 11:00 Continue vasotec, Norvasc, Hctz, and prn Hydralazine Labs q day   LOS: 7 days   Tilda Burrow 01/21/2018, 7:56 AM

## 2018-01-21 NOTE — Progress Notes (Addendum)
Risks of leaving AMA reviewed with pt and understanding verbalized. Pre-e s/s reviewed and pamphlet given. Pt understands to f/u with MD ASAP. Pt also verbalizes understanding of medications and the [facility] location where medication [facility] E[facility]scribed to. Pt understands the risks and signs the AMA paper.

## 2018-01-21 NOTE — Lactation Note (Signed)
This note was copied from a baby's chart. Lactation Consultation Note  Mother is feeling better today. She has been to NICU today to see infant. Infant is now 5 days. Infant was [redacted] weeks gestation at birth. Mother reports having breastfed her second child for 1 1/2 yrs. Mother states she has not pumped today and the last time she pumped she didn't get anything. Mother reports she can hand express a few drops of clear colostrum. Mother lives in [place]. She is not active with WIC. Mother plans to stay in [place]with her mother when discharged. She reports that her cousin is giving her an electric pump to use. She also plans to go and certify with [facility] I gave mother a harmony hand pump with instructions on use.  Advised mother to do good breast massage, hand express and then pump every 2-3 hours. Reviewed importance of pumping regularly to establish milk supply. Mother reports that her breast were firm yesterday , but soft again today.  Mother is aware of available LC services and community support.   Patient Name: Girl Camera Krienke RUEAV'W Date: 01/21/2018 Reason for consult: Follow-up assessment   Maternal Data    Feeding    LATCH Score                   Interventions    Lactation Tools Discussed/Used Tools: Pump Breast pump type: Double-Electric Breast Pump;Manual WIC Program: No   Consult Status Consult Status: Follow-up Date: 01/22/18 Follow-up type: In-patient    Stevan Born [facility] 01/21/2018, 2:03 PM

## 2018-01-21 NOTE — Discharge Summary (Signed)
Physician Discharge Summary  Patient ID: Dominique Shea MRN: [id number] DOB/AGE: Feb 15, 1991 27 y.o.  Admit date: 01/14/2018 Discharge date: 01/21/2018 - patient left AMA   Discharge Diagnoses:  Principal Problem:   Hemolysis, elevated liver enzymes, and low platelet (HELLP) syndrome during pregnancy, delivered, with postpartum complication Active Problems:   Thrombocytopenia (HCC)   Unwanted fertility   Spontaneous vaginal delivery   Postpartum care following vaginal delivery   Hospital Course: Please see HPI dated 01/14/2018 for details. This is a 27 y.o. [phone] now PPD#5 from a spontaneous vaginal delivery after induction of labor for pre-eclampsia with severe features and HELLP syndrome at [redacted]w[redacted]d. 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 Initially, she was admitted with goal to get 48 hrs of steroids in place, was placed on MgSO4, however her conditioned worsened and she was induced on HD#2. She had an uncomplicated delivery HD#3 and received 24 hrs MgSO4 post partum. She initially improved and seemed to be stableizing by PPD#3 with BP controlled on 3 agents, however on PPD#4 she complains of headache, had worsening BP and labs were worsening. She was again placed on MgSO4 and had a negative head CT. Her liver enzymes became significantly elevated and platelets dropped again. Today, her MgSO4 came off around 11 am and BP elevated but remains in mild range. Labs this morning had up trending platelets (although still quite low) and downtrending LFTs which remain elevated.   She was feeling well this am, called to room by patient request. She is feeling well, denies pain, headache, vision issues. Reports she is voiding well and passing gas. Bleeding is minimal this am.  Patient asked to speak with me regarding discharge and states she cannot stay here tonight and is prepared to leave AMA. I reviewed her course with her, emphasizing how ill she is, how lab work remains deranged and that even though she  is feeling better, she is still at very high risk of worsening again, seizures, stroke, bleeding, liver/kidney/blood dysfunction. She states that her children are in the care of family members and she cannot stay in the hospital any longer because she has to leave to care for them as family cannot continue to do so. I offered to contact social services to try to find temporary placement for children or assistance with temporary childcare and she refuses this. I reviewed the risks of her leaving multiple times including high risk of seizures, other risks of stroke, death, liver and bleeding issues. She verbalizes understanding of the above and desires to leave AMA. She understands this is completely against medical advice and that she remains in very serious condition.  Patient has agreed to return to MAU tomorrow for BP check and lab work. I reviewed her symptoms and have asked her to return to closest hospital with any symptoms. She verbalizes understanding of this. I have sent her blood pressure medication to the pharmacy, she understands she is to take it as directed and that prescribing her BP meds does not constitute adequate medical care or substitute for monitoring in the hospital, which again, is the recommended course. She verbalizes understanding of this. Patient signed AMA papers with RN and left hospital. I also reassured her that she is welcome to visit baby, who remains in NICU, at any time. Instructions for follow up given.    Physical exam  Vitals:   01/21/18 0825 01/21/18 0900 01/21/18 1000 01/21/18 1206  BP: (!) 148/101   (!) 141/105  Pulse: (!) 106   (!) 106  Resp: Temp: 98.5 F (36.9 C)   97.7 F (36.5 C)  TempSrc: Oral   Oral  SpO2: 99%   100%  Weight:      Height:       Physical Exam:  General: alert, oriented, cooperative Chest: CTAB, normal respiratory effort Heart: RRR  Abdomen: +BS, soft, appropriately tender to palpation  Uterine Fundus: firm, 2 fingers  below the umbilicus Lochia: moderate, rubra DVT Evaluation: no evidence of DVT Extremities: no edema, no calf tenderness   Postpartum contraception: Tubal Ligation to be done as interval  Disposition: patient left AMA  Discharged Condition: serious   Allergies as of 01/21/2018      Reactions   Peanut-containing Drug Products Anaphylaxis   Tomato Anaphylaxis   Benadryl [diphenhydramine]    Pt reports throat swelling   Doxycycline    Pt had facial swelling after receiving ms04, zofran, hydrocodone, tylenol, ivp dye, and doxycline. Unclear which was responsible agent.    Ibuprofen Swelling   Ivp Dye [iodinated Diagnostic Agents]    Pt had facial swelling after receiving ms04, zofran, hydrocodone, tylenol, ivp dye, and doxycline. Unclear which was responsible agent.       Medication List    TAKE these medications   amLODipine 10 MG tablet Commonly known as:  NORVASC Take 1 tablet (10 mg total) by mouth daily. Start taking on:  01/22/2018   enalapril 10 MG tablet Commonly known as:  VASOTEC Take 1 tablet (10 mg total) by mouth 2 (two) times daily.   hydrochlorothiazide 25 MG tablet Commonly known as:  HYDRODIURIL Take 1 tablet (25 mg total) by mouth daily. Start taking on:  01/22/2018   multivitamin-prenatal 27-0.8 MG Tabs tablet Take 1 tablet by mouth daily at 12 noon.      Follow-up Information    [facility]. Schedule an appointment as soon as possible for a visit.   Specialty:  Obstetrics and Gynecology Contact information: [address] [place] [address] [phone]           Total face-to-face time with patient: 25 minutes. Over 50% of encounter was spent on counseling and coordination of care.  Signed: ALDENA WORM 01/21/2018, 4:07 PM

## 2018-01-22 ENCOUNTER — Inpatient Hospital Stay (HOSPITAL_COMMUNITY)
Admission: AD | Admit: 2018-01-22 | Discharge: 2018-01-22 | Disposition: A | Discharge status: Home / Self Care | Payer: Medicaid Other | Source: Ambulatory Visit | Attending: Obstetrics and Gynecology | Admitting: Obstetrics and Gynecology

## 2018-01-22 ENCOUNTER — Encounter (HOSPITAL_COMMUNITY): Payer: Self-pay

## 2018-01-22 DIAGNOSIS — O1424 HELLP syndrome, complicating childbirth: Secondary | ICD-10-CM | POA: Diagnosis not present

## 2018-01-22 DIAGNOSIS — O1420 HELLP syndrome (HELLP), unspecified trimester: Secondary | ICD-10-CM

## 2018-01-22 DIAGNOSIS — O1410 Severe pre-eclampsia, unspecified trimester: Secondary | ICD-10-CM

## 2018-01-22 DIAGNOSIS — O9089 Other complications of the puerperium, not elsewhere classified: Secondary | ICD-10-CM | POA: Diagnosis present

## 2018-01-22 DIAGNOSIS — R748 Abnormal levels of other serum enzymes: Secondary | ICD-10-CM | POA: Insufficient documentation

## 2018-01-22 DIAGNOSIS — Z3480 Encounter for supervision of other normal pregnancy, unspecified trimester: Secondary | ICD-10-CM

## 2018-01-22 HISTORY — DX: HELLP syndrome (HELLP), unspecified trimester: O14.20

## 2018-01-22 LAB — COMPREHENSIVE METABOLIC PANEL
ALBUMIN: 3.2 g/dL — AB (ref 3.5–5.0)
ALT: 77 U/L — ABNORMAL HIGH (ref 14–54)
ANION GAP: 7 (ref 5–15)
AST: 34 U/L (ref 15–41)
Alkaline Phosphatase: 93 U/L (ref 38–126)
BUN: 19 mg/dL (ref 6–20)
CHLORIDE: 107 mmol/L (ref 101–111)
CO2: 22 mmol/L (ref 22–32)
Calcium: 8.2 mg/dL — ABNORMAL LOW (ref 8.9–10.3)
Creatinine, Ser: 0.85 mg/dL (ref 0.44–1.00)
GFR calc Af Amer: >60 mL/min (ref >60)
GFR calc non Af Amer: >60 mL/min (ref >60)
GLUCOSE: 99 mg/dL (ref 65–99)
Potassium: 4 mmol/L (ref 3.5–5.1)
Sodium: 136 mmol/L (ref 135–145)
TOTAL PROTEIN: 6.3 g/dL — AB (ref 6.5–8.1)
Total Bilirubin: 1.6 mg/dL — ABNORMAL HIGH (ref 0.3–1.2)

## 2018-01-22 LAB — CBC
HCT: 28.2 % — ABNORMAL LOW (ref 36.0–46.0)
Hemoglobin: 9.8 g/dL — ABNORMAL LOW (ref 12.0–15.0)
MCH: 32.1 pg (ref 26.0–34.0)
MCHC: 34.8 g/dL (ref 30.0–36.0)
MCV: 92.5 fL (ref 78.0–100.0)
PLATELETS: 69 10*3/uL — AB (ref 150–400)
RBC: 3.05 MIL/uL — ABNORMAL LOW (ref 3.87–5.11)
RDW: 16.4 % — AB (ref 11.5–15.5)
WBC: 14.4 10*3/uL — AB (ref 4.0–10.5)

## 2018-01-22 LAB — POCT PREGNANCY, URINE: PREG TEST UR: POSITIVE — AB

## 2018-01-22 MED ORDER — AMLODIPINE BESYLATE 10 MG PO TABS
10.0000 mg | ORAL_TABLET | Freq: Every day | ORAL | Status: DC
  Administered 2018-01-22: 10 mg via ORAL
  Filled 2018-01-22: qty 1

## 2018-01-22 MED ORDER — HYDROCHLOROTHIAZIDE 25 MG PO TABS
25.0000 mg | ORAL_TABLET | Freq: Once | ORAL | Status: AC
  Administered 2018-01-22: 25 mg via ORAL
  Filled 2018-01-22: qty 1

## 2018-01-22 MED ORDER — ENALAPRIL MALEATE 10 MG PO TABS
10.0000 mg | ORAL_TABLET | Freq: Once | ORAL | Status: AC
  Administered 2018-01-22: 10 mg via ORAL
  Filled 2018-01-22: qty 1

## 2018-01-22 NOTE — MAU Note (Signed)
States she is here for BP check and lab work. Left AMA yesterday. Has not been able to pick up BP meds, states pharmacy was closed yesterday when she left hospital. No HA, no vision changes, no epigastric pain, no swelling. Reports minimal PP bleeding

## 2018-01-22 NOTE — MAU Provider Note (Signed)
History     CSN: [id number]  Arrival date and time: 01/22/18 4098   First Provider Initiated Contact with Patient 01/22/18 [phone]      Chief Complaint  Patient presents with  . Hypertension  . Lab Work   Dominique Shea is a 27 y.o. [phone] who is 6 days PP S/P NSVD at 28 weeks 2/2 pre-eclampsia with severe features, and HELLP syndrome. She denies CHTN or pre-eclampsia with her prior pregnancies. She denies any HA, visual disturbances or RUQ pain. She has not been able to pick up her BP meds from the pharmacy at this time. She was given her meds yesterday prior to DC, and only the vasotec is twice per day. So, she missed the evening dose of vasotec, and has not had any BP meds today. She denies any other concerns today. Pumping milk for preterm baby in NICU, denies breast pain, abdominal pain. Bleeding is scant.   Hypertension  This is a new problem. The current episode started 1 to 4 weeks ago. The problem has been gradually improving since onset. Pertinent negatives include no headaches. Associated agents: pregnancy  Compliance problems: "pharmacy closed" unable to get medications yesterday      Past Medical History:  Diagnosis Date  . 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 History of gonorrhea 2014  . Thrombocytopenia (HCC)    DURING FIRST PREG    Past Surgical History:  Procedure Laterality Date  . CHOLECYSTECTOMY  2012   [facility]  . FRACTURE SURGERY      Family History  Problem Relation Age of Onset  . Hypertension Mother   . Migraines Mother   . Hypertension Father   . Migraines Father   . Migraines Sister   . Migraines Brother     Social History   Tobacco Use  . Smoking status: Former Smoker    Packs/day: 1.00    Years: 4.00    Pack years: 4.00    Types: Cigarettes  . Smokeless tobacco: Never Used  . Tobacco comment: stopped when found out she was pregnant  Substance Use Topics   . Alcohol use: Yes    Comment: occ  . Drug use: Yes    Types: Marijuana    Comment: last time was a couple months ago    Allergies:  Allergies  Allergen Reactions  . Peanut-Containing Drug Products Anaphylaxis  . Tomato Anaphylaxis  . Benadryl [Diphenhydramine] Swelling    Pt reports throat swelling  . Doxycycline Swelling    Pt had facial swelling after receiving ms04, zofran, hydrocodone, tylenol, ivp dye, and doxycline. Unclear which was responsible agent.   . Ibuprofen Swelling  . Ivp Dye [Iodinated Diagnostic Agents] Swelling    Pt had facial swelling after receiving ms04, zofran, hydrocodone, tylenol, ivp dye, and doxycline. Unclear which was responsible agent.     No medications prior to admission.    Review of Systems  Constitutional: Negative for fever.  Eyes: Negative for visual disturbance.  Gastrointestinal: Negative for abdominal pain.  Genitourinary: Positive for vaginal bleeding (decreasing ).  Neurological: Negative for headaches.   Physical Exam   Blood pressure (!) 153/104, pulse 82, temperature 98.3 F (36.8 C), temperature source Oral, resp. rate 18, height  (1.626 m), weight 138 lb 12 oz (62.9 kg), last menstrual period 06/29/2017, unknown if currently breastfeeding.  Physical Exam  Nursing note and vitals reviewed. Constitutional: She is oriented  to person, place, and time. She appears well-developed and well-nourished. No distress.  HENT:  Head: Normocephalic.  Cardiovascular: Normal rate.  Respiratory: Effort normal.  GI: Soft. There is no tenderness. There is no rebound.  Musculoskeletal: She exhibits no edema.  Neurological: She is alert and oriented to person, place, and time. She has normal reflexes. She exhibits normal muscle tone (no clonus ).  Skin: Skin is warm and dry.  Psychiatric: She has a normal mood and affect.   Results for orders placed or performed during the hospital encounter of 01/22/18 (from the past 24 hour(s))   Pregnancy, urine POC     Status: Abnormal   Collection Time: 01/22/18  9:32 AM  Result Value Ref Range   Preg Test, Ur POSITIVE (A) NEGATIVE  CBC     Status: Abnormal   Collection Time: 01/22/18  9:36 AM  Result Value Ref Range   WBC 14.4 (H) 4.0 - 10.5 K/uL   RBC 3.05 (L) 3.87 - 5.11 MIL/uL   Hemoglobin 9.8 (L) 12.0 - 15.0 g/dL   HCT 16.1 (L) 09.6 - 04.5 %   MCV 92.5 78.0 - 100.0 fL   MCH 32.1 26.0 - 34.0 pg   MCHC 34.8 30.0 - 36.0 g/dL   RDW 40.9 (H) 81.1 - 91.4 %   Platelets 69 (L) 150 - 400 K/uL  Comprehensive metabolic panel     Status: Abnormal   Collection Time: 01/22/18  9:36 AM  Result Value Ref Range   Sodium 136 135 - 145 mmol/L   Potassium 4.0 3.5 - 5.1 mmol/L   Chloride 107 101 - 111 mmol/L   CO2 22 22 - 32 mmol/L   Glucose, Bld 99 65 - 99 mg/dL   BUN 19 6 - 20 mg/dL   Creatinine, Ser 7.82 0.44 - 1.00 mg/dL   Calcium 8.2 (L) 8.9 - 10.3 mg/dL   Total Protein 6.3 (L) 6.5 - 8.1 g/dL   Albumin 3.2 (L) 3.5 - 5.0 g/dL   AST 34 15 - 41 U/L   ALT 77 (H) 14 - 54 U/L   Alkaline Phosphatase 93 38 - 126 U/L   Total Bilirubin 1.6 (H) 0.3 - 1.2 mg/dL   GFR calc non Af Amer >60 >60 mL/min   GFR calc Af Amer >60 >60 mL/min   Anion gap 7 5 - 15   Vitals:   01/22/18 0936 01/22/18 1005 01/22/18 1032 01/22/18 1054  BP: (!) 146/102 (!) 147/116 (!) 159/114 (!) 153/104  Pulse: 91 78 83 82  Resp: 18     Temp: 98.3 F (36.8 C)     TempSrc: Oral     Weight:      Height:       MAU Course  Procedures  MDM 1039: DW Dr. Emelda Fear, and reviewed lab work and current blood pressure readings. OK for patient to be DC home with BP check in the clinic later this week. Stress the importance of taking medications.   Assessment and Plan   1. Hemolysis, elevated liver enzymes, and low platelet (HELLP) syndrome during pregnancy, antepartum   2. Spontaneous vaginal delivery   3. Postpartum care following vaginal delivery   4. Supervision of other normal pregnancy, antepartum   5.  Severe pre-eclampsia, antepartum   6. Preterm delivery     DC home Warning signs reviewed  RX: stressed importance of taking medications, and to call us later today if there is a problem with being able to get medications at the pharmacy  today.  Return to MAU as needed FU with OB as planned  Follow-up Information    [facility].   Specialty:  Obstetrics and Gynecology Why:  They will call you for an appointment for a blood pressure check this week  Please continue to take all your blood pressure medications that have been prescribed  Contact information: [address] [place] [address] [phone]           Thressa Sheller 01/22/2018, 10:58 AM

## 2018-01-22 NOTE — Discharge Instructions (Signed)
HELLP Syndrome HELLP syndrome is a life-threatening liver disorder thought to be a type of severe preeclampsia during pregnancy. Preeclampsia is a disorder of pregnancy that causes high blood pressure and protein in the urine. It develops after the 20th week of pregnancy. The name HELLP stands for:  H - hemolytic anemia, hemolysis (destruction of blood cells).  EL - elevated liver enzymes (sign of liver damage).  LP - low platelet count (blood cells that help stop bleeding).  HELLP syndrome often occurs without warning and can be difficult to recognize. In most cases, HELLP syndrome occurs before 35 weeks of pregnancy, but it can also develop right after childbirth. It can be fatal to both the mother and baby. What are the causes? The cause of this condition is not known at this time. What are the signs or symptoms? Symptoms of this condition include:  Headache.  Blurry vision.  Pain in the upper right abdomen.  Shoulder, neck, and upper body pain.  Fatigue.  Feeling sick.  Seizures.  Extreme weight gain or swelling.  How is this diagnosed? This condition is diagnosed based on results of a blood test, which include:  A complete blood count.  Liver enzymes test (liver function tests).  Kidney function test.  Measurement of salts and other chemicals in your blood (electrolytes).  Blood coagulation tests.  How is this treated?  This condition is treated by delivering your baby as soon as possible. This may be done by giving you medicines to start contractions (induction of labor) or cesarean delivery.  Before delivery, you can be treated for a short time with an injection of magnesium sulphate. This medicine reduces muscle contractions and blocks the impulse from nerves to the muscles, which helps prevent seizures.  Medicines to lower and control blood pressure may also be used. Steroid hormones (corticosteroids) may be given to help your baby's lungs mature  faster.  Your health care provider may recommend that you take one low-dose aspirin (81 mg) each day to help prevent high blood pressure during your pregnancy if you are at risk for preeclampsia. You may be at risk for preeclampsia if: ? You had preeclampsia or eclampsia (high blood pressure with seizures) during a previous pregnancy. ? Your baby did not grow as expected during a previous pregnancy. ? You experienced preterm birth with a previous pregnancy. ? You experienced a separation of the placenta from the uterus (placental abruption) during a previous pregnancy. ? You experienced the loss of your baby during a previous pregnancy. ? You are pregnant with more than one baby. ? You have other medical conditions (such as diabetes or autoimmune disease).  Continuous medical management and monitoring of you and your baby is needed. This is true during pregnancy, during labor, during delivery, and after delivery (postpartum). A blood transfusion may be required if bleeding problems become severe. Get help right away if: You have symptoms of HELLP syndrome during your pregnancy.  You may need to call your local emergency services (911 in [place] to get to the hospital as soon as possible.  Do not drive yourself to the hospital.  This information is not intended to replace advice given to you by your health care provider. Make sure you discuss any questions you have with your health care provider. Document Released: 12/20/2006 Document Revised: 02/19/2016 Document Reviewed: 03/01/2013 Elsevier Interactive Patient Education  2017 Else[employer] Preeclampsia and Eclampsia Preeclampsia is a serious condition that develops only during pregnancy. It is also called toxemia of pregnancy.  This condition causes high blood pressure along with other symptoms, such as swelling and headaches. These symptoms may develop as the condition gets worse. Preeclampsia may occur at 20 weeks of pregnancy or  later. Diagnosing and treating preeclampsia early is very important. If not treated early, it can cause serious problems for you and your baby. One problem it can lead to is eclampsia, which is a condition that causes muscle jerking or shaking (convulsions or seizures) in the mother. Delivering your baby is the best treatment for preeclampsia or eclampsia. Preeclampsia and eclampsia symptoms usually go away after your baby is born. What are the causes? The cause of preeclampsia is not known. What increases the risk? The following risk factors make you more likely to develop preeclampsia:  Being pregnant for the first time.  Having had preeclampsia during a past pregnancy.  Having a family history of preeclampsia.  Having high blood pressure.  Being pregnant with twins or triplets.  Being 48 or older.  Being African-American.  Having kidney disease or diabetes.  Having medical conditions such as lupus or blood diseases.  Being very overweight (obese).  What are the signs or symptoms? The earliest signs of preeclampsia are:  High blood pressure.  Increased protein in your urine. Your health care provider will check for this at every visit before you give birth (prenatal visit).  Other symptoms that may develop as the condition gets worse include:  Severe headaches.  Sudden weight gain.  Swelling of the hands, face, legs, and feet.  Nausea and vomiting.  Vision problems, such as blurred or double vision.  Numbness in the face, arms, legs, and feet.  Urinating less than usual.  Dizziness.  Slurred speech.  Abdominal pain, especially upper abdominal pain.  Convulsions or seizures.  Symptoms generally go away after giving birth. How is this diagnosed? There are no screening tests for preeclampsia. Your health care provider will ask you about symptoms and check for signs of preeclampsia during your prenatal visits. You may also have tests that include:  Urine  tests.  Blood tests.  Checking your blood pressure.  Monitoring your babys heart rate.  Ultrasound.  How is this treated? You and your health care provider will determine the treatment approach that is best for you. Treatment may include:  Having more frequent prenatal exams to check for signs of preeclampsia, if you have an increased risk for preeclampsia.  Bed rest.  Reducing how much salt (sodium) you eat.  Medicine to lower your blood pressure.  Staying in the hospital, if your condition is severe. There, treatment will focus on controlling your blood pressure and the amount of fluids in your body (fluid retention).  You may need to take medicine (magnesium sulfate) to prevent seizures. This medicine may be given as an injection or through an IV tube.  Delivering your baby early, if your condition gets worse. You may have your labor started with medicine (induced), or you may have a cesarean delivery.  Follow these instructions at home: Eating and drinking   Drink enough fluid to keep your urine clear or pale yellow.  Eat a healthy diet that is low in sodium. Do not add salt to your food. Check nutrition labels to see how much sodium a food or beverage contains.  Avoid caffeine. Lifestyle  Do not use any products that contain nicotine or tobacco, such as cigarettes and e-cigarettes. If you need help quitting, ask your health care provider.  Do not use alcohol or drugs.  Avoid stress as much as possible. Rest and get plenty of sleep. General instructions  Take over-the-counter and prescription medicines only as told by your health care provider.  When lying down, lie on your side. This keeps pressure off of your baby.  When sitting or lying down, raise (elevate) your feet. Try putting some pillows underneath your lower legs.  Exercise regularly. Ask your health care provider what kinds of exercise are best for you.  Keep all follow-up and prenatal visits as  told by your health care provider. This is important. How is this prevented? To prevent preeclampsia or eclampsia from developing during another pregnancy:  Get proper medical care during pregnancy. Your health care provider may be able to prevent preeclampsia or diagnose and treat it early.  Your health care provider may have you take a low-dose aspirin or a calcium supplement during your next pregnancy.  You may have tests of your blood pressure and kidney function after giving birth.  Maintain a healthy weight. Ask your health care provider for help managing weight gain during pregnancy.  Work with your health care provider to manage any long-term (chronic) health conditions you have, such as diabetes or kidney problems.  Contact a health care provider if:  You gain more weight than expected.  You have headaches.  You have nausea or vomiting.  You have abdominal pain.  You feel dizzy or light-headed. Get help right away if:  You develop sudden or severe swelling anywhere in your body. This usually happens in the legs.  You gain 5 lbs (2.3 kg) or more during one week.  You have severe: ? Abdominal pain. ? Headaches. ? Dizziness. ? Vision problems. ? Confusion. ? Nausea or vomiting.  You have a seizure.  You have trouble moving any part of your body.  You develop numbness in any part of your body.  You have trouble speaking.  You have any abnormal bleeding.  You pass out. This information is not intended to replace advice given to you by your health care provider. Make sure you discuss any questions you have with your health care provider. Document Released: 09/10/2000 Document Revised: 05/11/2016 Document Reviewed: 04/19/2016 Elsevier Interactive Patient Education  [employer].   *If unable to fill prescription today, please call us back at [phone]

## 2018-01-23 ENCOUNTER — Ambulatory Visit: Payer: Medicaid Other

## 2018-01-23 ENCOUNTER — Institutional Professional Consult (permissible substitution): Payer: Medicaid Other

## 2018-01-23 NOTE — BH Specialist Note (Deleted)
Integrated Behavioral Health Initial Visit  MRN: [id number] Name: Dominique Shea  Number of Integrated Behavioral Health Clinician visits:: 1/6 Session Start time: ***  Session End time: *** Total time: {IBH Total Time:21014050}  Type of Service: Integrated Behavioral Health- Individual/Family Interpretor:No. Interpretor Name and Language: n/a   Warm Hand Off Completed.       SUBJECTIVE: Dominique Shea is a 27 y.o. female accompanied by {CHL AMB ACCOMPANIED NW:2956213086} Patient was referred by *** for ***. Patient reports the following symptoms/concerns: *** Duration of problem: ***; Severity of problem: {Mild/Moderate/Severe:20260}  OBJECTIVE: Mood: {BHH MOOD:22306} and Affect: {BHH AFFECT:22307} Risk of harm to self or others: {CHL AMB BH Suicide Current Mental Status:21022748}  LIFE CONTEXT: Family and Social: *** School/Work: *** Self-Care: *** Life Changes: ***  GOALS ADDRESSED: Patient will: 1. Reduce symptoms of: {IBH Symptoms:21014056} 2. Increase knowledge and/or ability of: {IBH Patient Tools:21014057}  3. Demonstrate ability to: {IBH Goals:21014053}  INTERVENTIONS: Interventions utilized: {IBH Interventions:21014054}  Standardized Assessments completed: {IBH Screening Tools:21014051}  ASSESSMENT: Patient currently experiencing ***.   Patient may benefit from ***.  PLAN: 1. Follow up with behavioral health clinician on : *** 2. Behavioral recommendations: *** 3. Referral(s): {IBH Referrals:21014055} 4. "From scale of 1-10, how likely are you to follow plan?": ***  Rae Lips, LCSW   Referral to [facility] [phone])  Referral(s) placed for:  ***

## 2018-01-26 ENCOUNTER — Ambulatory Visit: Payer: Medicaid Other | Admitting: *Deleted

## 2018-01-26 ENCOUNTER — Inpatient Hospital Stay (HOSPITAL_COMMUNITY)
Admission: AD | Admit: 2018-01-26 | Discharge: 2018-01-26 | Disposition: A | Discharge status: Home / Self Care | Payer: Medicaid Other | Source: Ambulatory Visit | Attending: Obstetrics & Gynecology | Admitting: Obstetrics & Gynecology

## 2018-01-26 VITALS — BP 106/81 | Ht 64.0 in | Wt 130.0 lb

## 2018-01-26 DIAGNOSIS — Z0130 Encounter for examination of blood pressure without abnormal findings: Secondary | ICD-10-CM

## 2018-01-26 NOTE — MAU Note (Signed)
Per MAU provider pt was to follow up in the clinic, pt walked down and same reported to [place].

## 2018-01-26 NOTE — MAU Note (Signed)
Pt reports she is here for a b/p check. Denies headache or other symptoms. States she is having some problems with anxiety

## 2018-01-26 NOTE — Progress Notes (Signed)
Patient went to MAU instead of coming to clinic.  Haywood Lasso, RN in MAU brought patient to clinic.  BP 106/81.  Patient denies headache, abdominal pain and visual disturbances.  Verified medications and spoke with Dr. Marice Potter.  Recommends discontinuing enalapril/Vasotec.  Patient states understanding.  Appointments scheduled for BP check next week on 02/02/18. Marland Kitchen  Patient has complaints of anxiety.  States she has had anxiety all her life.  States she wonders if maybe this is causing her elevated BP.  I explained having a baby in the NICU will cause anxiety as well and it would be a great idea to meet with Asher Muir to further discuss if she is agreeable.  Patient is agreeable.  Appointment scheduled for 02/02/18 with Asher Muir.  AVS printed and given to patient.  Patient leaving office to visit baby in NICU appears very excited.

## 2018-01-27 NOTE — Progress Notes (Signed)
I have reviewed this chart and agree with the RN/CMA assessment and management.    Terald Jump C Yadhira Mckneely, MD, FACOG Attending Physician, [facility]  

## 2018-02-01 NOTE — Discharge Summary (Signed)
Physician Discharge Summary   Patient ID: Dominique Shea [id number] 27 y.o. [birth year]  Admit date: 01/14/2018  Discharge date and time: 01/14/2018  8:35 PM   Admitting Physician: Natale Milch, MD   Discharge Physician: Adelene Idler MD  Admission Diagnoses: Back Pain 2 Weeks  Discharge Diagnoses: Severe Preeclampsia  Admission Condition: fair  Discharged Condition: fair  Indication for Admission: Severe preeclampsia  Hospital Course: Patient was admitted ans stabilized then transferred for care to [facility] hosptial because of her early gestational age  Consults: None  Significant Diagnostic Studies: labs: please see results review  Treatments: Antihypertensive medications and magnessium  Discharge Exam: BP (!) 157/91   Pulse 72   Temp 98 F (36.7 C) (Oral)   Resp 18   LMP 06/29/2017   SpO2 100%   General Appearance:    Alert, cooperative, no distress, appears stated age  Head:    Normocephalic, without obvious abnormality, atraumatic  Eyes:    PERRL, conjunctiva/corneas clear, EOM's intact, fundi    benign, both eyes  Ears:    Normal TM's and external ear canals, both ears  Nose:   Nares normal, septum midline, mucosa normal, no drainage    or sinus tenderness  Throat:   Lips, mucosa, and tongue normal; teeth and gums normal  Neck:   Supple, symmetrical, trachea midline, no adenopathy;    thyroid:  no enlargement/tenderness/nodules; no carotid   bruit or JVD  Back:     Symmetric, no curvature, ROM normal, no CVA tenderness  Lungs:     Clear to auscultation bilaterally, respirations unlabored  Chest Wall:    No tenderness or deformity   Heart:    Regular rate and rhythm, S1 and S2 normal, no murmur, rub   or gallop  Breast Exam:    No tenderness, masses, or nipple abnormality  Abdomen:     Soft, non-tender, bowel sounds active all four quadrants,    no masses, no organomegaly  Genitalia:    Normal female without lesion, discharge or tenderness   Rectal:    Normal tone, normal prostate, no masses or tenderness;   guaiac negative stool  Extremities:   Extremities normal, atraumatic, no cyanosis or edema  Pulses:   2+ and symmetric all extremities  Skin:   Skin color, texture, turgor normal, no rashes or lesions  Lymph nodes:   Cervical, supraclavicular, and axillary nodes normal  Neurologic:   CNII-XII intact, normal strength, sensation and reflexes    throughout    Disposition:  There are no questions and answers to display.        Patient Instructions:  Allergies as of 01/14/2018      Reactions   Peanut-containing Drug Products Anaphylaxis   Tomato Anaphylaxis   Benadryl [diphenhydramine]    Pt reports throat swelling   Doxycycline    Pt had facial swelling after receiving ms04, zofran, hydrocodone, tylenol, ivp dye, and doxycline. Unclear which was responsible agent.    Ibuprofen Swelling   Ivp Dye [iodinated Diagnostic Agents]    Pt had facial swelling after receiving ms04, zofran, hydrocodone, tylenol, ivp dye, and doxycline. Unclear which was responsible agent.       Medication List    You have not been prescribed any medications.    Activity: bedrest Diet: regular diet Wound Care: none needed  Signed: Gentry Pilson R Steen Bisig 02/01/2018 10:14 PM

## 2018-02-02 ENCOUNTER — Institutional Professional Consult (permissible substitution): Payer: Medicaid Other

## 2018-02-02 ENCOUNTER — Ambulatory Visit: Payer: Medicaid Other

## 2018-02-02 NOTE — BH Specialist Note (Deleted)
Integrated Behavioral Health Initial Visit  MRN: [id number] Name: Dominique Shea  Number of Integrated Behavioral Health Clinician visits:: 1/6 Session Start time: ***  Session End time: *** Total time: {IBH Total Time:21014050}  Type of Service: Integrated Behavioral Health- Individual/Family Interpretor:No. Interpretor Name and Language: n/a   Warm Hand Off Completed.       SUBJECTIVE: Dominique Shea is a 27 y.o. female accompanied by {CHL AMB ACCOMPANIED QI:6962952841} Patient was referred by Dr Marice Potter for anxiety. Patient reports the following symptoms/concerns: *** Duration of problem: ***; Severity of problem: {Mild/Moderate/Severe:20260}  OBJECTIVE: Mood: {BHH MOOD:22306} and Affect: {BHH AFFECT:22307} Risk of harm to self or others: No plan to harm self or others ***  LIFE CONTEXT: Family and Social: *** School/Work: *** Self-Care: *** Life Changes: Childbirth, baby in NICU *** (hyperemesis and marijuana use in pregnancy) *** GOALS ADDRESSED: Patient will: 1. Reduce symptoms of: {IBH Symptoms:21014056} 2. Increase knowledge and/or ability of: {IBH Patient Tools:21014057}  3. Demonstrate ability to: {IBH Goals:21014053}  INTERVENTIONS: Interventions utilized: {IBH Interventions:21014054}  Standardized Assessments completed: {IBH Screening Tools:21014051}  ASSESSMENT: Patient currently experiencing ***.   Patient may benefit from psychoeducation and brief therapeutic interventions regarding coping with symptoms of *** .  PLAN: 1. Follow up with behavioral health clinician on : *** 2. Behavioral recommendations:  -*** -*** Read educational materials regarding coping with symptoms of ***  3. Referral(s): {IBH Referrals:21014055} 4. "From scale of 1-10, how likely are you to follow plan?": ***  Kenzie Flakes C Auriah Hollings, LCSW    No flowsheet data found.  No flowsheet data found.

## 2018-02-28 ENCOUNTER — Telehealth: Payer: Self-pay

## 2018-02-28 ENCOUNTER — Ambulatory Visit: Payer: Medicaid Other | Admitting: Medical

## 2018-02-28 ENCOUNTER — Encounter: Payer: Self-pay | Admitting: Family Medicine

## 2018-02-28 NOTE — Telephone Encounter (Signed)
Patient called to find out when she is scheduled to have her BTL.

## 2018-03-03 ENCOUNTER — Ambulatory Visit: Payer: Medicaid Other | Admitting: Obstetrics and Gynecology

## 2018-03-03 NOTE — Telephone Encounter (Signed)
Called patient stating I am returning her phone call. Discussed with patient she currently isn't scheduled for BTL and she will need to come in for pp visit to set that up. Patient verbalized understanding and will await phone call from front office staff with an appt.

## 2018-03-07 ENCOUNTER — Ambulatory Visit (INDEPENDENT_AMBULATORY_CARE_PROVIDER_SITE_OTHER): Payer: Medicaid Other | Admitting: Obstetrics and Gynecology

## 2018-03-07 ENCOUNTER — Encounter: Payer: Self-pay | Admitting: Obstetrics and Gynecology

## 2018-03-07 VITALS — BP 140/92 | HR 62 | Ht 64.0 in | Wt 143.0 lb

## 2018-03-07 DIAGNOSIS — Z3009 Encounter for other general counseling and advice on contraception: Secondary | ICD-10-CM

## 2018-03-07 NOTE — Progress Notes (Signed)
Patient left without being seen and plans to reschedule appointment.  Dominique Idlerhristanna Kramer Hanrahan MD [facility], [facility] 03/07/18 6:39 PM

## 2018-03-08 ENCOUNTER — Encounter: Payer: Self-pay | Admitting: Obstetrics and Gynecology

## 2018-03-14 ENCOUNTER — Ambulatory Visit (INDEPENDENT_AMBULATORY_CARE_PROVIDER_SITE_OTHER): Payer: Medicaid Other | Admitting: Obstetrics and Gynecology

## 2018-03-14 ENCOUNTER — Encounter: Payer: Self-pay | Admitting: Obstetrics and Gynecology

## 2018-03-14 VITALS — BP 118/82 | HR 79 | Ht 64.0 in | Wt 146.0 lb

## 2018-03-14 DIAGNOSIS — D696 Thrombocytopenia, unspecified: Secondary | ICD-10-CM

## 2018-03-14 DIAGNOSIS — Z3009 Encounter for other general counseling and advice on contraception: Secondary | ICD-10-CM | POA: Diagnosis not present

## 2018-03-14 DIAGNOSIS — Z30013 Encounter for initial prescription of injectable contraceptive: Secondary | ICD-10-CM

## 2018-03-14 MED ORDER — MEDROXYPROGESTERONE ACETATE 150 MG/ML IM SUSP: 150.0000 mg | INTRAMUSCULAR | 3 refills | Status: DC

## 2018-03-14 NOTE — Progress Notes (Signed)
Patient ID: Dominique Shea, female   DOB: 1991/01/15, 27 y.o.   MRN: [id number]  Reason for Consult: Contraception (discuss birth control)   Referred by Dominique Running, MD  Subjective:     HPI:  Dominique Shea is a 27 y.o. female she is 8 weeks postpartum from a vaginal delivery complicated by preeclampsia and long standing thrombocytopenia. She has been recovering well. She has questions regarding her plan for contraception. She desired sterilization but it was not done while she was in the hospital because of her low platelet counts. If possible she would still like to have a tubal ligation or salpingectomy. She has not had a period since delivery. She is not breast feeding.   Past Medical History:  Diagnosis Date  . 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 History of gonorrhea 2014  . Thrombocytopenia (HCC)    DURING FIRST PREG   Family History  Problem Relation Age of Onset  . Hypertension Mother   . Migraines Mother   . Hypertension Father   . Migraines Father   . Migraines Sister   . Migraines Brother    Past Surgical History:  Procedure Laterality Date  . CHOLECYSTECTOMY  2012   [facility]  . FRACTURE SURGERY      Short Social History:  Social History   Tobacco Use  . Smoking status: Former Smoker    Packs/day: 1.00    Years: 4.00    Pack years: 4.00    Types: Cigarettes  . Smokeless tobacco: Never Used  . Tobacco comment: stopped when found out she was pregnant  Substance Use Topics  . Alcohol use: Yes    Comment: occ    Allergies  Allergen Reactions  . Peanut-Containing Drug Products Anaphylaxis  . Tomato Anaphylaxis  . Benadryl [Diphenhydramine] Swelling    Pt reports throat swelling  . Doxycycline Swelling    Pt had facial swelling after receiving ms04, zofran, hydrocodone, tylenol, ivp dye, and doxycline. Unclear which was responsible agent.   . Ibuprofen  Swelling  . Ivp Dye [Iodinated Diagnostic Agents] Swelling    Pt had facial swelling after receiving ms04, zofran, hydrocodone, tylenol, ivp dye, and doxycline. Unclear which was responsible agent.     Current Outpatient Medications  Medication Sig Dispense Refill  . amLODipine (NORVASC) 10 MG tablet Take 1 tablet (10 mg total) by mouth daily. 30 tablet 2  . hydrochlorothiazide (HYDRODIURIL) 25 MG tablet Take 1 tablet (25 mg total) by mouth daily. 30 tablet 2  . Prenatal Vit-Fe Fumarate-FA (PRENATAL MULTIVITAMIN) TABS tablet Take 1 tablet by mouth daily at 12 noon.    . medroxyPROGESTERone (DEPO-PROVERA) 150 MG/ML injection Inject 1 mL (150 mg total) into the muscle every 3 (three) months. 1 mL 3   No current facility-administered medications for this visit.     Review of Systems  Constitutional: Negative for chills, fatigue, fever and unexpected weight change.  HENT: Negative for trouble swallowing.  Eyes: Negative for loss of vision.  Respiratory: Negative for cough, shortness of breath and wheezing.  Cardiovascular: Negative for chest pain, leg swelling, palpitations and syncope.  GI: Negative for abdominal pain, blood in stool, diarrhea, nausea and vomiting.  GU: Negative for difficulty urinating, dysuria, frequency and hematuria.  Musculoskeletal: Negative for back pain, leg pain and joint pain.  Skin: Negative for rash.  Neurological: Negative for dizziness, headaches, light-headedness, numbness and seizures.  Psychiatric: Negative for behavioral problem, confusion, depressed mood and sleep disturbance.        Objective:  Objective   Vitals:   03/14/18 1537  BP: 118/82  Pulse: 79  Weight: 146 lb (66.2 kg)  Height: 5\' 4"  (1.626 m)   Body mass index is 25.06 kg/m.  Physical Exam  Constitutional: She is oriented to person, place, and time. She appears well-developed and well-nourished.  HENT:  Head: Normocephalic and atraumatic.  Eyes: EOM are normal.    Cardiovascular: Normal rate, regular rhythm and normal heart sounds.  Pulmonary/Chest: Effort normal and breath sounds normal.  Neurological: She is alert and oriented to person, place, and time.  Skin: Skin is warm and dry.  Psychiatric: She has a normal mood and affect. Her behavior is normal. Judgment and thought content normal.  Nursing note and vitals reviewed.       Assessment/Plan:    27 yo 8 weeks postpartum Birth control Patient would like to start depo. She denies sexual activity since the birth of her daughter. She has not had a period yet. She is breast feeding. Will need a urine pregnancy test before injection.  Thrombocytopenia She has a history of thrombocytopenia and abnormal A2since the birth of her previous child. She saw Dominique Shea in February for this year but has not followed up since. Referral resent.   Sterilization She would still like a tubal ligation if her platelet counts are  okay and she has insurance. Consent obtained today.  Will have depo as a bridge and to manage history of heavy vaginal bleeding.  More than 25 minutes were spent face to face with the patient in the room with more than 50% of the time spent providing counseling and discussing the plan of management. We discussed her options for birth control management and heavy menstrual bleeding which may be related to thrombocytopenia.   Dominique Idlerhristanna Ahlam Piscitelli MD [facility], [facility] 03/14/18 4:12 PM

## 2018-03-15 ENCOUNTER — Telehealth: Payer: Self-pay | Admitting: Obstetrics and Gynecology

## 2018-03-15 LAB — CBC
Hematocrit: 37 % (ref 34.0–46.6)
Hemoglobin: 11.7 g/dL (ref 11.1–15.9)
MCH: 29.5 pg (ref 26.6–33.0)
MCHC: 31.6 g/dL (ref 31.5–35.7)
MCV: 93 fL (ref 79–97)
PLATELETS: 101 10*3/uL — AB (ref 150–450)
RBC: 3.96 x10E6/uL (ref 3.77–5.28)
RDW: 13 % (ref 12.3–15.4)
WBC: 7 10*3/uL (ref 3.4–10.8)

## 2018-03-15 NOTE — Telephone Encounter (Signed)
-----   Message from Natale Milchhristanna R Schuman, MD sent at 03/15/2018  9:08 AM EDT ----- Surgery Booking Request Patient Full Name:  Dominique Shea  MRN: [id number]  DOB: 10/03/1990  Surgeon: Natale Milchhristanna R Schuman, MD  Requested Surgery Date and Time: July (after the 18th) Primary Diagnosis AND Code: desires sterilization Secondary Diagnosis and Code:  Surgical Procedure: laparoscopic bilateral salpingectomy L&D Notification: No Admission Status: same day surgery Length of Surgery: 1.5 hours Special Case Needs: none H&P: yes (date) Phone Interview???: yes Interpreter: Language:  Medical Clearance: no Special Scheduling Instructions:   Rosanne SackKasey signed consent with patient 03/14/18

## 2018-03-15 NOTE — Telephone Encounter (Signed)
Patient is aware [facility] @ 10:50am w/ Dr. Jerene PitchSchuman, Pre-admit Testing phone interview to be scheduled, and Or on 04/20/18. Patient is aware she may receive [facility] and [facility]. Patient is aware, per Dr. Jerene PitchSchuman, that she will need 1-2 days of childcare assistance, and generally 1 week off from work. Ext given.

## 2018-03-15 NOTE — Progress Notes (Signed)
Platelets sufficient for surgery, released to mychart. Would be okay for tubal ligation surgery.

## 2018-03-16 ENCOUNTER — Ambulatory Visit (INDEPENDENT_AMBULATORY_CARE_PROVIDER_SITE_OTHER): Payer: Medicaid Other

## 2018-03-16 DIAGNOSIS — Z3042 Encounter for surveillance of injectable contraceptive: Secondary | ICD-10-CM

## 2018-03-16 DIAGNOSIS — N911 Secondary amenorrhea: Secondary | ICD-10-CM

## 2018-03-16 LAB — POCT URINE PREGNANCY: PREG TEST UR: NEGATIVE

## 2018-03-16 MED ORDER — MEDROXYPROGESTERONE ACETATE 150 MG/ML IM SUSP
150.0000 mg | Freq: Once | INTRAMUSCULAR | Status: AC
  Administered 2018-03-16: 150 mg via INTRAMUSCULAR

## 2018-03-21 ENCOUNTER — Inpatient Hospital Stay: Payer: Medicaid Other | Admitting: Hematology and Oncology

## 2018-03-23 ENCOUNTER — Telehealth: Payer: Self-pay | Admitting: *Deleted

## 2018-03-23 ENCOUNTER — Telehealth: Payer: Self-pay

## 2018-03-23 ENCOUNTER — Inpatient Hospital Stay: Payer: Medicaid Other | Attending: Oncology | Admitting: Oncology

## 2018-03-23 ENCOUNTER — Other Ambulatory Visit: Payer: Self-pay

## 2018-03-23 ENCOUNTER — Inpatient Hospital Stay: Payer: Medicaid Other

## 2018-03-23 VITALS — BP 160/120 | Temp 97.3°F | Resp 18 | Ht 64.0 in | Wt 144.0 lb

## 2018-03-23 DIAGNOSIS — O99012 Anemia complicating pregnancy, second trimester: Secondary | ICD-10-CM

## 2018-03-23 DIAGNOSIS — I10 Essential (primary) hypertension: Secondary | ICD-10-CM | POA: Diagnosis not present

## 2018-03-23 DIAGNOSIS — D509 Iron deficiency anemia, unspecified: Secondary | ICD-10-CM

## 2018-03-23 DIAGNOSIS — D696 Thrombocytopenia, unspecified: Secondary | ICD-10-CM | POA: Diagnosis not present

## 2018-03-23 DIAGNOSIS — Z79899 Other long term (current) drug therapy: Secondary | ICD-10-CM | POA: Insufficient documentation

## 2018-03-23 LAB — CBC WITH DIFFERENTIAL/PLATELET
Basophils Absolute: 0 10*3/uL (ref 0–0.1)
Basophils Relative: 1 %
Eosinophils Absolute: 0.1 10*3/uL (ref 0–0.7)
Eosinophils Relative: 2 %
HEMATOCRIT: 39.4 % (ref 35.0–47.0)
Hemoglobin: 13.3 g/dL (ref 12.0–16.0)
LYMPHS ABS: 1.5 10*3/uL (ref 1.0–3.6)
LYMPHS PCT: 30 %
MCH: 31.4 pg (ref 26.0–34.0)
MCHC: 33.8 g/dL (ref 32.0–36.0)
MCV: 92.8 fL (ref 80.0–100.0)
MONO ABS: 0.5 10*3/uL (ref 0.2–0.9)
Monocytes Relative: 9 %
NEUTROS ABS: 3 10*3/uL (ref 1.4–6.5)
Neutrophils Relative %: 58 %
Platelets: 77 10*3/uL — ABNORMAL LOW (ref 150–400)
RBC: 4.25 MIL/uL (ref 3.80–5.20)
RDW: 13.2 % (ref 11.5–14.5)
WBC: 5.1 10*3/uL (ref 3.6–11.0)

## 2018-03-23 LAB — IRON AND TIBC
IRON: 35 ug/dL (ref 28–170)
Saturation Ratios: 9 % — ABNORMAL LOW (ref 10.4–31.8)
TIBC: 386 ug/dL (ref 250–450)
UIBC: 351 ug/dL

## 2018-03-23 LAB — FERRITIN: Ferritin: 11 ng/mL (ref 11–307)

## 2018-03-23 NOTE — Progress Notes (Signed)
Recheck b/p manual 180/130

## 2018-03-23 NOTE — Telephone Encounter (Signed)
-----   Message from Creig HinesArchana C Rao, MD sent at 03/23/2018  1:24 PM EDT ----- She is still iron deficient. Not anemic. She can either take oral iron and we should check ferritin and iron studies after 2 months or we can give her IV iron. Please let me know what she decides. Thanks, Investment banker, corporatearchana

## 2018-03-23 NOTE — Progress Notes (Signed)
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 Patient has been known to have thrombocytopenia at least dating back to 2014 with a platelet counts were between 110s to 120. Most recent CBC is from 02/21/2017 showed white count of 12.3, H&H of 14/40.6 and a platelet count of 82 and on 03/18/2017 a platelet count was 78. Denies any family history of bleeding disorder. Denies any bruising or easy bleeding. She has had prior surgeries including gallbladder surgery without any bleeding complications.  Results of blood work from 04/01/2017 were as follows: CBC showed white count of 7.6, H&H of 13.8/41, platelet count of 89. CMP was within normal limits except for mildly elevated bilirubin of 1.5. B12 and folate were within normal limits. HIV testing was negative. Hep C antibody was negative. Peripheral blood smear review showed thrombocytopenia without clumping. RBCs are unremarkable. WBC and differential within normal limits. No abnormal or immaturecells seen  She was last seen in early feb 2019 when her platelet count was in the 100's. Shortly following that she was admitted for pre eclampisa and HELLP syndrome and delivered her third child at 4728 weeks of gestation   Interval history- she reports doing well presently. Her 3rd child is still in NICU and will likely be discharged in the next 2 weeks. Patient denies any bleeding or bruising  ECOG PS- 0 Pain scale- 0   Review of systems- Review of Systems  Constitutional:  Negative for chills, fever, malaise/fatigue and weight loss.  HENT: Negative for congestion, ear discharge and nosebleeds.   Eyes: Negative for blurred vision.  Respiratory: Negative for cough, hemoptysis, sputum production, shortness of breath and wheezing.   Cardiovascular: Negative for chest pain, palpitations, orthopnea and claudication.  Gastrointestinal: Negative for abdominal pain, blood in stool, constipation, diarrhea, heartburn, melena, nausea and vomiting.  Genitourinary: Negative for dysuria, flank pain, frequency, hematuria and urgency.  Musculoskeletal: Negative for back pain, joint pain and myalgias.  Skin: Negative for rash.  Neurological: Negative for dizziness, tingling, focal weakness, seizures, weakness and headaches.  Endo/Heme/Allergies: Does not bruise/bleed easily.  Psychiatric/Behavioral: Negative for depression and suicidal ideas. The patient does not have insomnia.      Allergies  Allergen Reactions  . Peanut-Containing Drug Products Anaphylaxis  . Tomato Anaphylaxis  . Benadryl [Diphenhydramine] Swelling    Pt reports throat swelling  . Doxycycline Swelling    Pt had facial swelling after receiving ms04, zofran, hydrocodone, tylenol, ivp dye, and doxycline. Unclear which was responsible agent.   . Ibuprofen Swelling  . Ivp Dye [Iodinated Diagnostic Agents] Swelling    Pt had facial swelling after receiving ms04, zofran, hydrocodone, tylenol, ivp dye, and doxycline. Unclear which was responsible agent.      Past Medical History:  Diagnosis Date  . 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 History of gonorrhea 2014  . Thrombocytopenia (HCC)    DURING FIRST PREG     Past Surgical History:  Procedure Laterality Date  . CHOLECYSTECTOMY  2012  [facility]  . FRACTURE SURGERY      Social History   Socioeconomic History  . Marital status: Single    Spouse name: Not on file  .  Number of children: 2  . Years of education: GED  . Highest education level: Not on file  Occupational History  . Not on file  Social Needs  . Financial resource strain: Not on file  . Food insecurity:    Worry: Not on file    Inability: Not on file  . Transportation needs:    Medical: Not on file    Non-medical: Not on file  Tobacco Use  . Smoking status: Former Smoker    Packs/day: 1.00    Years: 4.00    Pack years: 4.00    Types: Cigarettes  . Smokeless tobacco: Never Used  . Tobacco comment: stopped when found out she was pregnant  Substance and Sexual Activity  . Alcohol use: Yes    Comment: occ  . Drug use: Yes    Types: Marijuana    Comment: last time was a couple months ago  . Sexual activity: Not Currently    Birth control/protection: None  Lifestyle  . Physical activity:    Days per week: Not on file    Minutes per session: Not on file  . Stress: Not on file  Relationships  . Social connections:    Talks on phone: Not on file    Gets together: Not on file    Attends religious service: Not on file    Active member of club or organization: Not on file    Attends meetings of clubs or organizations: Not on file    Relationship status: Not on file  . Intimate partner violence:    Fear of current or ex partner: Not on file    Emotionally abused: Not on file    Physically abused: Not on file    Forced sexual activity: Not on file  Other Topics Concern  . Not on file  Social History Narrative  . Not on file    Family History  Problem Relation Age of Onset  . Hypertension Mother   . Migraines Mother   . Hypertension Father   . Migraines Father   . Migraines Sister   . Migraines Brother      Current Outpatient Medications:  .  amLODipine (NORVASC) 10 MG tablet, Take 1 tablet (10 mg total) by mouth daily., Disp: 30 tablet, Rfl: 2 .  hydrochlorothiazide (HYDRODIURIL) 25 MG tablet, Take 1 tablet (25 mg total) by mouth daily., Disp: 30 tablet, Rfl: 2 .   medroxyPROGESTERone (DEPO-PROVERA) 150 MG/ML injection, Inject 1 mL (150 mg total) into the muscle every 3 (three) months., Disp: 1 mL, Rfl: 3 .  Prenatal Vit-Fe Fumarate-FA (PRENATAL MULTIVITAMIN) TABS tablet, Take 1 tablet by mouth daily at 12 noon., Disp: , Rfl:   Physical exam:  Vitals:   03/23/18 1020 03/23/18 1028  BP:  (!) 160/120  Resp: 18   Temp: (!) 97.3 F (36.3 C)   TempSrc: Tympanic   Weight: 144 lb (65.3 kg)   Height: 5\' 4"  (1.626 m)    Physical Exam  Constitutional: She is oriented to person, place, and time. She appears well-developed and well-nourished.  HENT:  Head: Normocephalic and atraumatic.  Eyes: Pupils are equal, round, and reactive to light. EOM are normal.  Neck: Normal range of motion.  Cardiovascular: Normal rate, regular rhythm and normal heart sounds.  Pulmonary/Chest: Effort normal and breath  sounds normal.  Abdominal: Soft. Bowel sounds are normal.  Musculoskeletal: She exhibits no edema.  Neurological: She is alert and oriented to person, place, and time.  Skin: Skin is warm and dry.     CMP Latest Ref Rng & Units 01/22/2018  Glucose 65 - 99 mg/dL 99  BUN 6 - 20 mg/dL 19  Creatinine 1.61 - 0.96 mg/dL 0.45  Sodium 409 - 811 mmol/L 136  Potassium 3.5 - 5.1 mmol/L 4.0  Chloride 101 - 111 mmol/L 107  CO2 22 - 32 mmol/L 22  Calcium 8.9 - 10.3 mg/dL 8.2(L)  Total Protein 6.5 - 8.1 g/dL 6.3(L)  Total Bilirubin 0.3 - 1.2 mg/dL 9.1(Y)  Alkaline Phos 38 - 126 U/L 93  AST 15 - 41 U/L 34  ALT 14 - 54 U/L 77(H)   CBC Latest Ref Rng & Units 03/23/2018  WBC 3.6 - 11.0 K/uL 5.1  Hemoglobin 12.0 - 16.0 g/dL 78.2  Hematocrit 95.6 - 47.0 % 39.4  Platelets 150 - 400 K/uL 77(L)      Assessment and plan- Patient is a 27 y.o. female with following issues:  1. Thrombocytopenia- platelet counts 2 months post delivery are still 77. I therefore think she has a component of ITP even outisde her pregnancy. Will repeat cbc with diff in 2 months and see her  following that  2. b12 deficiency- levels from today are pending. Hb improved to 13.3 from 11.7  3. Uncontrolled HTN- patient reports feeling nervous during doctors visits. Her repeat BP check still showed systolic BP in 170's. Diastolic in 110's. We have recommended that she should go to ER. She is asymptomatic, understands risks of not getting this evaluated. She wishes to hold off on going to ER. We will inform [facility] about this who has been following her.    Visit Diagnosis 1. Thrombocytopenia (HCC)      Dr. Owens Shark, MD, MPH [facility] at [facility] [phone] 03/23/2018 10:56 AM

## 2018-03-23 NOTE — Telephone Encounter (Signed)
Pt came in to office and her b/p elevated and after visit we manually checked it 180/130.pt states that  She had bad experience in hospital and now she gets anxious when she is in the building.  We let her try to relax in consult room and then took her downstairs where they were doing a craft and let her do the craft and I rechecked it and it was 176/115.  I called Smith RobertRao and let her know what level was and what to do. Dr. Smith Robertao says she needs to go to ER  Especially when she had baby early because of preeclampsia.  I spoke to pt and she has to go and pick up her kids at 11:30 and she is not sx of htn. I told her that if she experiences a HA, numbness and tingling of extremity, slurring of speech,drooping of her face, chest pain, and pt states she will go if it gets higher or if she has any above sx. Patient states she has b/p at home and will recheck it. I did tell pt by not going to ER where dr. Smith Robertao wanted her to go and pt refused she is taking matters of her health  Into her own self. .Marland Kitchen

## 2018-03-23 NOTE — Telephone Encounter (Signed)
Patient was contacted by Dominique Shea and the patient was agreeable to get IV Feraheme x 2 doses and agreed to have Toni AmendCourtney find her a PCP to manage her blood pressure. Ander Gaster( [facility]) The patient was agreeable and understanding.

## 2018-03-24 ENCOUNTER — Telehealth: Payer: Self-pay

## 2018-03-24 LAB — VITAMIN B12: VITAMIN B 12: 217 pg/mL (ref 180–914)

## 2018-03-24 NOTE — Addendum Note (Signed)
Addended by: Owens SharkAO, ARCHANA C on: 03/24/2018 09:51 AM   Modules accepted: Orders

## 2018-03-24 NOTE — Telephone Encounter (Signed)
Spoke with the patient to inform her that her B-12  levels are low 214. The patient has agreed to get B-12 on the next visit then monthly. The patient does state she is taking oral B-12, but levels are still low. The patient was agreeable and understanding to get B-12 shots

## 2018-03-27 ENCOUNTER — Telehealth: Payer: Self-pay

## 2018-03-27 ENCOUNTER — Telehealth: Payer: Self-pay | Admitting: Obstetrics and Gynecology

## 2018-03-27 ENCOUNTER — Encounter: Payer: Self-pay | Admitting: Oncology

## 2018-03-27 NOTE — Telephone Encounter (Signed)
Can you please call her and tell her what to expect?   Thanks, Ovidio KinArchana

## 2018-03-27 NOTE — Telephone Encounter (Signed)
Patient is aware her Medicaid is inactive this month. Patient will contact Medicaid to try and resolve the issue prior to her upcoming surgery. Ext given.

## 2018-03-27 NOTE — Telephone Encounter (Signed)
I have made contact with Ms. Dominique Shea and explain to her what to expect for feraheme infusion / it will be 1 -2 hour to infuse through IV and she may have to wait at least 10-15 minute to make sure no reaction and she will also receive a B-12 injection.It is for her Iron level which are still low and B- 12 level are still low. The patient was understanding and agreeable for treatment.

## 2018-03-28 ENCOUNTER — Inpatient Hospital Stay: Payer: Self-pay | Attending: Oncology

## 2018-03-28 VITALS — BP 147/92 | HR 69 | Temp 98.4°F | Resp 20

## 2018-03-28 DIAGNOSIS — D509 Iron deficiency anemia, unspecified: Secondary | ICD-10-CM

## 2018-03-28 DIAGNOSIS — D693 Immune thrombocytopenic purpura: Secondary | ICD-10-CM | POA: Insufficient documentation

## 2018-03-28 DIAGNOSIS — D649 Anemia, unspecified: Secondary | ICD-10-CM | POA: Insufficient documentation

## 2018-03-28 MED ORDER — CYANOCOBALAMIN 1000 MCG/ML IJ SOLN
1000.0000 ug | INTRAMUSCULAR | Status: DC
  Administered 2018-03-28: 1000 ug via INTRAMUSCULAR
  Filled 2018-03-28: qty 1

## 2018-03-28 MED ORDER — SODIUM CHLORIDE 0.9 % IV SOLN
Freq: Once | INTRAVENOUS | Status: AC
  Administered 2018-03-28: 15:00:00 via INTRAVENOUS
  Filled 2018-03-28: qty 1000

## 2018-03-28 MED ORDER — SODIUM CHLORIDE 0.9 % IV SOLN
510.0000 mg | Freq: Once | INTRAVENOUS | Status: AC
  Administered 2018-03-28: 510 mg via INTRAVENOUS
  Filled 2018-03-28: qty 17

## 2018-03-28 NOTE — Progress Notes (Signed)
BP elevated today 157/109. Per Dr Smith Robertao okay to proceed with feraheme today.

## 2018-03-29 ENCOUNTER — Encounter: Payer: Self-pay | Admitting: Pharmacy Technician

## 2018-03-29 NOTE — Progress Notes (Unsigned)
Patient has been approved for drug assistance by [employer] for Feraheme. The enrollment period is from 03/28/18-03/29/19 based on self pay. First DOS covered is 03/28/2018.

## 2018-04-04 ENCOUNTER — Inpatient Hospital Stay: Payer: Self-pay

## 2018-04-04 VITALS — BP 122/69 | HR 65 | Temp 97.1°F | Resp 18

## 2018-04-04 DIAGNOSIS — D509 Iron deficiency anemia, unspecified: Secondary | ICD-10-CM

## 2018-04-04 MED ORDER — SODIUM CHLORIDE 0.9 % IV SOLN
Freq: Once | INTRAVENOUS | Status: AC
  Administered 2018-04-04: 14:00:00 via INTRAVENOUS
  Filled 2018-04-04: qty 1000

## 2018-04-04 MED ORDER — SODIUM CHLORIDE 0.9 % IV SOLN
510.0000 mg | Freq: Once | INTRAVENOUS | Status: AC
  Administered 2018-04-04: 510 mg via INTRAVENOUS
  Filled 2018-04-04: qty 17

## 2018-04-14 ENCOUNTER — Inpatient Hospital Stay: Admission: RE | Admit: 2018-04-14 | Payer: Medicaid Other | Source: Ambulatory Visit

## 2018-04-14 ENCOUNTER — Encounter: Payer: Medicaid Other | Admitting: Obstetrics and Gynecology

## 2018-04-14 NOTE — Telephone Encounter (Signed)
Patient called to say she has not resolved the issue w/ Medicaid, that she has been to the [facility] and they are unable to tell her why it is no longer active, and that it is tied to her son's Medicaid, which she says is still active. I suggested she go [facility] office again and try to reapply. Patient decided to cancel the surgery and will call to reschedule when the Medicaid issue is resolved. Ext given.

## 2018-04-14 NOTE — Telephone Encounter (Signed)
okay

## 2018-04-14 NOTE — Telephone Encounter (Signed)
There has been a lot of back and forth. If she wants to come in for the ParaGard removal and replacement I would be happy to do that.

## 2018-04-20 ENCOUNTER — Ambulatory Visit: Admit: 2018-04-20 | Payer: Medicaid Other | Admitting: Obstetrics and Gynecology

## 2018-04-20 SURGERY — SALPINGECTOMY, BILATERAL, LAPAROSCOPIC
Anesthesia: Choice | Laterality: Bilateral

## 2018-04-28 ENCOUNTER — Inpatient Hospital Stay: Payer: Self-pay | Attending: Oncology

## 2018-04-28 DIAGNOSIS — D509 Iron deficiency anemia, unspecified: Secondary | ICD-10-CM | POA: Insufficient documentation

## 2018-04-28 MED ORDER — CYANOCOBALAMIN 1000 MCG/ML IJ SOLN
1000.0000 ug | INTRAMUSCULAR | Status: DC
  Administered 2018-04-28: 1000 ug via INTRAMUSCULAR

## 2018-05-23 ENCOUNTER — Inpatient Hospital Stay: Payer: Self-pay

## 2018-05-23 ENCOUNTER — Inpatient Hospital Stay: Payer: Self-pay | Admitting: Oncology

## 2018-05-23 NOTE — Progress Notes (Deleted)
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 Patient has been known to have thrombocytopenia at least dating back to 2014 with a platelet counts were between 110s to 120. Most recent CBC is from 02/21/2017 showed white count of 12.3, H&H of 14/40.6 and a platelet count of 82 and on 03/18/2017 a platelet count was 78. Denies any family history of bleeding disorder. Denies any bruising or easy bleeding. She has had prior surgeries including gallbladder surgery without any bleeding complications.  Results of blood work from 04/01/2017 were as follows: CBC showed white count of 7.6, H&H of 13.8/41, platelet count of 89. CMP was within normal limits except for mildly elevated bilirubin of 1.5. B12 and folate were within normal limits. HIV testing was negative. Hep C antibody was negative. Peripheral blood smear review showed thrombocytopenia without clumping. RBCs are unremarkable. WBC and differential within normal limits. No abnormal or immaturecells seen  She was last seen in early feb 2019 when her platelet count was in the 100's. Shortly following that she was admitted for pre eclampisa and HELLP syndrome and delivered her third child at 71 weeks of gestation  Interval history- ***  ECOG PS- *** Pain scale- *** Opioid associated constipation- ***  Review of systems- ROS   Current treatment- ***  Allergies  Allergen Reactions  . Peanut-Containing Drug Products Anaphylaxis  . Tomato Anaphylaxis  . Benadryl  [Diphenhydramine] Swelling    Pt reports throat swelling  . Doxycycline Swelling    Pt had facial swelling after receiving ms04, zofran, hydrocodone, tylenol, ivp dye, and doxycline. Unclear which was responsible agent.   . Ibuprofen Swelling  . Ivp Dye [Iodinated Diagnostic Agents] Swelling    Pt had facial swelling after receiving ms04, zofran, hydrocodone, tylenol, ivp dye, and doxycline. Unclear which was responsible agent.      Past Medical History:  Diagnosis Date  . 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 History of gonorrhea 2014  . Thrombocytopenia (HCC)    DURING FIRST PREG     Past Surgical History:  Procedure Laterality Date  . CHOLECYSTECTOMY  2012   [facility]  . FRACTURE SURGERY      Social History   Socioeconomic History  . Marital status: Single    Spouse name: Not on file  . Number of children: 2  . Years of education: GED  . Highest education level: Not on file  Occupational History  . Not on file  Social Needs  . Financial resource strain: Not on file  . Food insecurity:    Worry: Not on file    Inability: Not on file  . Transportation needs:    Medical: Not on file    Non-medical: Not on file  Tobacco Use  . Smoking status: Former Smoker    Packs/day: 1.00    Years: 4.00    Pack years: 4.00    Types: Cigarettes  . Smokeless tobacco: Never Used  . Tobacco comment: stopped when found out she  was pregnant  Substance and Sexual Activity  . Alcohol use: Yes    Comment: occ  . Drug use: Yes    Types: Marijuana    Comment: last time was a couple months ago  . Sexual activity: Not Currently    Birth control/protection: None  Lifestyle  . Physical activity:    Days per week: Not on file    Minutes per session: Not on file  . Stress: Not on file  Relationships  . Social connections:    Talks on phone: Not on file    Gets together: Not on file    Attends religious  service: Not on file    Active member of club or organization: Not on file    Attends meetings of clubs or organizations: Not on file    Relationship status: Not on file  . Intimate partner violence:    Fear of current or ex partner: Not on file    Emotionally abused: Not on file    Physically abused: Not on file    Forced sexual activity: Not on file  Other Topics Concern  . Not on file  Social History Narrative  . Not on file    Family History  Problem Relation Age of Onset  . Hypertension Mother   . Migraines Mother   . Hypertension Father   . Migraines Father   . Migraines Sister   . Migraines Brother      Current Outpatient Medications:  .  amLODipine (NORVASC) 10 MG tablet, Take 1 tablet (10 mg total) by mouth daily., Disp: 30 tablet, Rfl: 2 .  hydrochlorothiazide (HYDRODIURIL) 25 MG tablet, Take 1 tablet (25 mg total) by mouth daily., Disp: 30 tablet, Rfl: 2 .  medroxyPROGESTERone (DEPO-PROVERA) 150 MG/ML injection, Inject 1 mL (150 mg total) into the muscle every 3 (three) months., Disp: 1 mL, Rfl: 3 .  Prenatal Vit-Fe Fumarate-FA (PRENATAL MULTIVITAMIN) TABS tablet, Take 1 tablet by mouth daily at 12 noon., Disp: , Rfl:   Physical exam: There were no vitals filed for this visit. Physical Exam   CMP Latest Ref Rng & Units 01/22/2018  Glucose 65 - 99 mg/dL 99  BUN 6 - 20 mg/dL 19  Creatinine 1.610.44 - 0.961.00 mg/dL 0.450.85  Sodium 409135 - 811145 mmol/L 136  Potassium 3.5 - 5.1 mmol/L 4.0  Chloride 101 - 111 mmol/L 107  CO2 22 - 32 mmol/L 22  Calcium 8.9 - 10.3 mg/dL 8.2(L)  Total Protein 6.5 - 8.1 g/dL 6.3(L)  Total Bilirubin 0.3 - 1.2 mg/dL 9.1(Y1.6(H)  Alkaline Phos 38 - 126 U/L 93  AST 15 - 41 U/L 34  ALT 14 - 54 U/L 77(H)   CBC Latest Ref Rng & Units 03/23/2018  WBC 3.6 - 11.0 K/uL 5.1  Hemoglobin 12.0 - 16.0 g/dL 78.213.3  Hematocrit 95.635.0 - 47.0 % 39.4  Platelets 150 - 400 K/uL 77(L)    No images are attached to the encounter.  No results found.   Assessment and plan-  Patient is a 27 y.o. female with following issues:  1. Thrombocytopenia likely due to ITP  2: B12 deficiency: leevls from 2 months ago were still low at 217.    Visit Diagnosis 1. Chronic ITP (idiopathic thrombocytopenic purpura) (HCC)   2. B12 deficiency      Dr. Owens SharkArchana Houston Zapien, MD, MPH [facility] [phone] 05/23/2018 8:05 AM

## 2018-05-26 ENCOUNTER — Telehealth: Payer: Self-pay | Admitting: Advanced Practice Midwife

## 2018-05-26 NOTE — Telephone Encounter (Signed)
Appointment Request From: Carla Drapehandler Y Kroon    With Provider: Tresea MallJane Gledhill, CNM [[facility]]    Preferred Date Range: From 05/30/2018 To 05/31/2018    Preferred Times: Any    Reason: To address the following health maintenance concerns.  Tetanus/Tdap  Influenza Vaccine    Comments:  Any time before 2   Called and left voicemail for patient to call back to be  schedule

## 2018-05-30 ENCOUNTER — Inpatient Hospital Stay: Payer: Self-pay | Attending: Oncology

## 2018-06-27 ENCOUNTER — Other Ambulatory Visit: Payer: Self-pay | Admitting: *Deleted

## 2018-06-27 NOTE — Telephone Encounter (Signed)
Received 2 fax for refills norvasc and hydrochlorothiazide. Will route to provider. Patient is now postpartum and did not come to her appointment. She is also under treatment for thrombocytopenia.

## 2018-06-29 ENCOUNTER — Inpatient Hospital Stay: Payer: Self-pay | Attending: Oncology

## 2018-07-24 ENCOUNTER — Ambulatory Visit: Payer: Self-pay | Admitting: Obstetrics and Gynecology

## 2018-11-22 ENCOUNTER — Other Ambulatory Visit: Payer: Self-pay

## 2018-11-22 ENCOUNTER — Encounter (HOSPITAL_COMMUNITY): Payer: Self-pay

## 2018-11-22 ENCOUNTER — Emergency Department (HOSPITAL_COMMUNITY)
Admission: EM | Admit: 2018-11-22 | Discharge: 2018-11-22 | Disposition: A | Discharge status: Home / Self Care | Payer: Self-pay | Attending: Emergency Medicine | Admitting: Emergency Medicine

## 2018-11-22 DIAGNOSIS — Z87891 Personal history of nicotine dependence: Secondary | ICD-10-CM | POA: Insufficient documentation

## 2018-11-22 DIAGNOSIS — M436 Torticollis: Secondary | ICD-10-CM | POA: Insufficient documentation

## 2018-11-22 HISTORY — DX: Anxiety disorder, unspecified: F41.9

## 2018-11-22 MED ORDER — ONDANSETRON HCL 4 MG PO TABS
4.0000 mg | ORAL_TABLET | Freq: Once | ORAL | Status: AC
  Administered 2018-11-22: 4 mg via ORAL
  Filled 2018-11-22: qty 1

## 2018-11-22 MED ORDER — TRAMADOL HCL 50 MG PO TABS: 50.0000 mg | ORAL_TABLET | Freq: Four times a day (QID) | ORAL | 0 refills | Status: DC | PRN

## 2018-11-22 MED ORDER — DEXAMETHASONE SODIUM PHOSPHATE 10 MG/ML IJ SOLN
10.0000 mg | Freq: Once | INTRAMUSCULAR | Status: AC
  Administered 2018-11-22: 10 mg via INTRAMUSCULAR
  Filled 2018-11-22: qty 1

## 2018-11-22 MED ORDER — CYCLOBENZAPRINE HCL 10 MG PO TABS: 10.0000 mg | ORAL_TABLET | Freq: Three times a day (TID) | ORAL | 0 refills | Status: DC

## 2018-11-22 MED ORDER — DIAZEPAM 5 MG PO TABS
10.0000 mg | ORAL_TABLET | Freq: Once | ORAL | Status: AC
  Administered 2018-11-22: 10 mg via ORAL
  Filled 2018-11-22: qty 2

## 2018-11-22 MED ORDER — HYDROCODONE-ACETAMINOPHEN 5-325 MG PO TABS
1.0000 | ORAL_TABLET | Freq: Once | ORAL | Status: AC
  Administered 2018-11-22: 1 via ORAL
  Filled 2018-11-22: qty 1

## 2018-11-22 NOTE — ED Triage Notes (Signed)
Pt presents to ED with complaints of generalized neck pain x 3 weeks, states it is worse this morning. Pt denies injury. Pt states her pain is worse when she is laying down. Pt denies headache, vomiting, or fever.

## 2018-11-22 NOTE — ED Notes (Signed)
Pt cussing in waiting room about waiting time.

## 2018-11-22 NOTE — Discharge Instructions (Addendum)
Heating pad to the shoulders will be helpful.  Please use Flexeril 3 times daily.  Use Tylenol extra strength for mild pain.  Use Ultram for more severe pain.  Flexeril and Ultram may cause drowsiness.  Please do not drive a vehicle, operate machinery, drink alcohol, or participate in activities requiring concentration when taking either these medications.

## 2018-11-22 NOTE — ED Notes (Signed)
Called into patient's room. Patient requesting this nurse to give her pain medication. Advised patient of need to wait for PA to assess patient and order medications. Patient verbalized understanding.

## 2018-11-22 NOTE — ED Provider Notes (Signed)
[facility] EMERGENCY DEPARTMENT Provider Note   CSN: [id number] Arrival date & time: 11/22/18  9417    History   Chief Complaint Chief Complaint  Patient presents with  . Neck Pain    HPI Dominique Shea is a 28 y.o. female.     The history is provided by the patient.  Neck Pain  Pain location:  Generalized neck Quality:  Aching Pain radiates to:  Does not radiate Pain severity:  Moderate Pain is:  Same all the time Onset quality:  Sudden Duration:  3 weeks Timing:  Constant Progression:  Worsening Context: not fall and not recent injury   Context comment:  Pt woke up with pain of the neck. Relieved by:  Nothing Exacerbated by: movement. Ineffective treatments:  NSAIDs Associated symptoms: no bladder incontinence, no bowel incontinence, no chest pain, no numbness, no photophobia, no syncope and no weakness     Past Medical History:  Diagnosis Date  . 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 Anxiety   . 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 History of gonorrhea 2014  . Thrombocytopenia (HCC)    DURING FIRST PREG    Patient Active Problem List   Diagnosis Date Noted  . Iron deficiency anemia 03/23/2018  . Elevated blood pressure affecting pregnancy in second trimester, antepartum 01/14/2018  . Hemolysis, elevated liver enzymes, and low platelet (HELLP) syndrome during pregnancy, delivered, with postpartum complication 01/14/2018  . Unwanted fertility 01/14/2018  . Substance abuse affecting pregnancy, antepartum 09/26/2017  . Adjustment disorder with anxiety 09/08/2017  . Gonorrhea affecting pregnancy in first trimester 08/24/2017  . Thrombocytopenia (HCC) 07/12/2011    Past Surgical History:  Procedure Laterality Date  . CHOLECYSTECTOMY  2012   [facility]  . FRACTURE SURGERY       OB History    Gravida  4   Para  3   Term  2   Preterm  1   AB  1   Living  3     SAB  1   TAB      Ectopic      Multiple  0   Live Births  3            Home Medications    Prior to Admission medications   Medication Sig Start Date End Date Taking? Authorizing Provider  amLODipine (NORVASC) 10 MG tablet Take 1 tablet (10 mg total) by mouth daily. Patient not taking: Reported on 11/22/2018 01/22/18   Conan Bowens, MD  hydrochlorothiazide (HYDRODIURIL) 25 MG tablet Take 1 tablet (25 mg total) by mouth daily. Patient not taking: Reported on 11/22/2018 01/22/18   Conan Bowens, MD  medroxyPROGESTERone (DEPO-PROVERA) 150 MG/ML injection Inject 1 mL (150 mg total) into the muscle every 3 (three) months. Patient not taking: Reported on 11/22/2018 03/14/18 06/12/18  Natale Milch, MD    Family History Family History  Problem Relation Age of Onset  . Hypertension Mother   . Migraines Mother   . Hypertension Father   . Migraines Father   . Migraines Sister   . Migraines Brother     Social History Social History   Tobacco Use  . Smoking status: Former Smoker    Packs/day: 1.00    Years: 4.00    Pack years: 4.00    Types: Cigarettes  . Smokeless tobacco: Never Used  . Tobacco comment: stopped when found out she was pregnant  Substance Use Topics  .  Alcohol use: Not Currently    Comment: occ  . Drug use: Yes    Types: Marijuana    Comment: last time was a couple months ago     Allergies   Peanut-containing drug products; Tomato; Benadryl [diphenhydramine]; Doxycycline; Ibuprofen; and Ivp dye [iodinated diagnostic agents]   Review of Systems Review of Systems  Constitutional: Negative for activity change.       All ROS Neg except as noted in HPI  HENT: Negative for nosebleeds.   Eyes: Negative for photophobia and discharge.  Respiratory: Negative for cough, shortness of breath and wheezing.   Cardiovascular: Negative for chest pain, palpitations and syncope.  Gastrointestinal: Negative for abdominal pain, blood in stool and bowel incontinence.  Genitourinary: Negative for  bladder incontinence, dysuria, frequency and hematuria.  Musculoskeletal: Positive for neck pain. Negative for arthralgias and back pain.  Skin: Negative.   Neurological: Negative for dizziness, seizures, speech difficulty, weakness and numbness.  Psychiatric/Behavioral: Negative for confusion and hallucinations.     Physical Exam Updated Vital Signs BP (!) 135/93 (BP Location: Right Arm)   Pulse 78   Temp 98.2 F (36.8 C) (Oral)   Resp 18   Ht 5\' 4"  (1.626 m)   Wt 68 kg   LMP 11/03/2018   SpO2 100%   BMI 25.75 kg/m   Physical Exam Vitals signs and nursing note reviewed.  Constitutional:      Appearance: She is well-developed. She is not toxic-appearing.  HENT:     Head: Normocephalic.     Right Ear: Tympanic membrane and external ear normal.     Left Ear: Tympanic membrane and external ear normal.  Eyes:     General: Lids are normal.     Pupils: Pupils are equal, round, and reactive to light.  Neck:     Musculoskeletal: Neck supple. Decreased range of motion. Torticollis present.     Vascular: No carotid bruit.  Cardiovascular:     Rate and Rhythm: Normal rate and regular rhythm.     Pulses: Normal pulses.     Heart sounds: Normal heart sounds.  Pulmonary:     Effort: No respiratory distress.     Breath sounds: Normal breath sounds.  Abdominal:     General: Bowel sounds are normal.     Palpations: Abdomen is soft.     Tenderness: There is no abdominal tenderness. There is no guarding.  Lymphadenopathy:     Head:     Right side of head: No submandibular adenopathy.     Left side of head: No submandibular adenopathy.     Cervical: No cervical adenopathy.  Skin:    General: Skin is warm and dry.  Neurological:     Mental Status: She is alert and oriented to person, place, and time.     Cranial Nerves: No cranial nerve deficit.     Sensory: No sensory deficit.  Psychiatric:        Mood and Affect: Mood is anxious. Affect is tearful.        Speech: Speech  normal.      ED Treatments / Results  Labs (all labs ordered are listed, but only abnormal results are displayed) Labs Reviewed - No data to display  EKG None  Radiology No results found.  Procedures Procedures (including critical care time)  Medications Ordered in ED Medications - No data to display   Initial Impression / Assessment and Plan / ED Course  I have reviewed the triage vital signs and the  nursing notes.  Pertinent labs & imaging results that were available during my care of the patient were reviewed by me and considered in my medical decision making (see chart for details).         Final Clinical Impressions(s) / ED Diagnoses MDM Patient complained of generalized neck pain for nearly 3 weeks.  No gross neurologic deficits appreciated at this time. The examination favors torticollis.   The patient will be treated with Flexeril and Ultram.  Patient is to follow-up with her primary physician, and/or orthopedics if not improving.  Patient is in agreement with this plan.  Patient reports symptoms improved after medication in the emergency department.        Final diagnoses:  Torticollis, acute    ED Discharge Orders         Ordered    cyclobenzaprine (FLEXERIL) 10 MG tablet  3 times daily     11/22/18 1325    traMADol (ULTRAM) 50 MG tablet  Every 6 hours PRN     11/22/18 1325           Ivery Quale, PA-C 11/23/18 1143    Samuel Jester, DO 11/24/18 [phone]

## 2018-11-28 ENCOUNTER — Other Ambulatory Visit: Payer: Self-pay

## 2018-11-28 ENCOUNTER — Emergency Department: Payer: Self-pay

## 2018-11-28 ENCOUNTER — Emergency Department
Admission: EM | Admit: 2018-11-28 | Discharge: 2018-11-28 | Disposition: A | Discharge status: Home / Self Care | Payer: Self-pay | Attending: Emergency Medicine | Admitting: Emergency Medicine

## 2018-11-28 ENCOUNTER — Encounter: Payer: Self-pay | Admitting: Emergency Medicine

## 2018-11-28 DIAGNOSIS — Z87891 Personal history of nicotine dependence: Secondary | ICD-10-CM | POA: Insufficient documentation

## 2018-11-28 DIAGNOSIS — Z91010 Allergy to peanuts: Secondary | ICD-10-CM | POA: Insufficient documentation

## 2018-11-28 DIAGNOSIS — F419 Anxiety disorder, unspecified: Secondary | ICD-10-CM | POA: Insufficient documentation

## 2018-11-28 DIAGNOSIS — F4322 Adjustment disorder with anxiety: Secondary | ICD-10-CM | POA: Insufficient documentation

## 2018-11-28 DIAGNOSIS — M5412 Radiculopathy, cervical region: Secondary | ICD-10-CM | POA: Insufficient documentation

## 2018-11-28 DIAGNOSIS — Z9049 Acquired absence of other specified parts of digestive tract: Secondary | ICD-10-CM | POA: Insufficient documentation

## 2018-11-28 MED ORDER — PREDNISONE 10 MG (21) PO TBPK: ORAL_TABLET | ORAL | 0 refills | Status: DC

## 2018-11-28 NOTE — ED Triage Notes (Signed)
Pt presents to ED c/o neck and L shoulder pain that started 2/24. Was seen at [facility] 2/26 and dx with muscle spasms. States she has been taking medications as prescribed but pain is getting worse.

## 2018-11-28 NOTE — Discharge Instructions (Addendum)
Follow-up with your regular doctor if not better in 5 to 7 days.  Or you may follow-up with orthopedics or a chiropractor.  Use medication as prescribed.  Apply ice to the right shoulder.  Return if worsening

## 2018-11-28 NOTE — ED Provider Notes (Signed)
[facility] Emergency Department Provider Note  ____________________________________________   First MD Initiated Contact with Patient 11/28/18 1514     (approximate)  I have reviewed the triage vital signs and the nursing notes.   HISTORY  Chief Complaint Neck Pain and Shoulder Pain    HPI Dominique Shea is a 28 y.o. female presents emergency department complaining of right shoulder and arm pain.  She was seen at [facility] and given a muscle relaxer and tramadol.  She states they did not do anything.  She said they did not x-ray her neck or her shoulder.  She denies any known injury.  She denies any fever or chills.  States pain has not gotten better with the medication.    Past Medical History:  Diagnosis Date  . 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 Anxiety   . 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 History of gonorrhea 2014  . Thrombocytopenia (HCC)    DURING FIRST PREG    Patient Active Problem List   Diagnosis Date Noted  . Iron deficiency anemia 03/23/2018  . Elevated blood pressure affecting pregnancy in second trimester, antepartum 01/14/2018  . Hemolysis, elevated liver enzymes, and low platelet (HELLP) syndrome during pregnancy, delivered, with postpartum complication 01/14/2018  . Unwanted fertility 01/14/2018  . Substance abuse affecting pregnancy, antepartum 09/26/2017  . Adjustment disorder with anxiety 09/08/2017  . Gonorrhea affecting pregnancy in first trimester 08/24/2017  . Thrombocytopenia (HCC) 07/12/2011    Past Surgical History:  Procedure Laterality Date  . CHOLECYSTECTOMY  2012   [facility]  . FRACTURE SURGERY      Prior to Admission medications   Medication Sig Start Date End Date Taking? Authorizing Provider  cyclobenzaprine (FLEXERIL) 10 MG tablet Take 1 tablet (10 mg total) by mouth 3 (three) times daily. 11/22/18   Ivery Quale, PA-C  predniSONE (STERAPRED UNI-PAK 21  TAB) 10 MG (21) TBPK tablet Take 6 pills on day one then decrease by 1 pill each day 11/28/18   Faythe Ghee, PA-C  traMADol (ULTRAM) 50 MG tablet Take 1 tablet (50 mg total) by mouth every 6 (six) hours as needed. 11/22/18   Ivery Quale, PA-C    Allergies Peanut-containing drug products; Tomato; Benadryl [diphenhydramine]; Doxycycline; Ibuprofen; and Ivp dye [iodinated diagnostic agents]  Family History  Problem Relation Age of Onset  . Hypertension Mother   . Migraines Mother   . Hypertension Father   . Migraines Father   . Migraines Sister   . Migraines Brother     Social History Social History   Tobacco Use  . Smoking status: Former Smoker    Packs/day: 1.00    Years: 4.00    Pack years: 4.00    Types: Cigarettes  . Smokeless tobacco: Never Used  . Tobacco comment: stopped when found out she was pregnant  Substance Use Topics  . Alcohol use: Not Currently    Comment: occ  . Drug use: Yes    Types: Marijuana    Comment: last time was a couple months ago    Review of Systems  Constitutional: No fever/chills Eyes: No visual changes. ENT: No sore throat. Respiratory: Denies cough Genitourinary: Negative for dysuria. Musculoskeletal: Negative for back pain.  Positive for right sided neck and shoulder pain Skin: Negative for rash.    ____________________________________________   PHYSICAL EXAM:  VITAL SIGNS: ED Triage Vitals  Enc Vitals Group     BP 11/28/18  1356 136/82     Pulse Rate 11/28/18 1356 74     Resp 11/28/18 1356 18     Temp 11/28/18 1356 97.8 F (36.6 C)     Temp Source 11/28/18 1356 Oral     SpO2 11/28/18 1356 100 %     Weight 11/28/18 1357 150 lb (68 kg)     Height 11/28/18 1357 5\' 4"  (1.626 m)     Head Circumference --      Peak Flow --      Pain Score 11/28/18 1357 10     Pain Loc --      Pain Edu? --      Excl. in GC? --     Constitutional: Alert and oriented. Well appearing and in no acute distress.  I had to wake the patient  when I entered the room. Eyes: Conjunctivae are normal.  Head: Atraumatic. Nose: No congestion/rhinnorhea. Mouth/Throat: Mucous membranes are moist.   Neck:  supple no lymphadenopathy noted Cardiovascular: Normal rate, regular rhythm. Heart sounds are normal Respiratory: Normal respiratory effort.  No retractions, lungs c t a  GU: deferred Musculoskeletal: FROM all extremities, warm and well perfused Neurologic:  Normal speech and language.  Skin:  Skin is warm, dry and intact. No rash noted. Psychiatric: Mood and affect are normal. Speech and behavior are normal.  ____________________________________________   LABS (all labs ordered are listed, but only abnormal results are displayed)  Labs Reviewed - No data to display ____________________________________________   ____________________________________________  RADIOLOGY  X-rays C-spine is negative  ____________________________________________   PROCEDURES  Procedure(s) performed: No  Procedures    ____________________________________________   INITIAL IMPRESSION / ASSESSMENT AND PLAN / ED COURSE  Pertinent labs & imaging results that were available during my care of the patient were reviewed by me and considered in my medical decision making (see chart for details).   Patient is 28 year old female presents to the emergency department with complaints of neck pain that radiates to the right shoulder and right arm.  She was seen and pending given a muscle relaxer and tramadol.  She states pain has not decreased with these medications.  Physical exam the trapezius muscle has a large spasm noted.  Grips are equal bilaterally.  Patient has decreased range of motion secondary discomfort.  X-ray of the C-spine shows reversed lordosis  Explained findings to the patient.  She was given a prescription for Sterapred.  She is to continue taking the muscle relaxer and tramadol if needed.  Apply ice to the right shoulder.  She  is to follow-up with orthopedics or chiropractor.  She states she understands will comply.  She discharged stable condition..   As part of my medical decision making, I reviewed the following data within the electronic MEDICAL RECORD NUMBER Nursing notes reviewed and incorporated, Old chart reviewed, Radiograph reviewed x-rays C-spine is negative for any acute abnormality, Notes from prior ED visits and [place] Controlled Substance Database  ____________________________________________   FINAL CLINICAL IMPRESSION(S) / ED DIAGNOSES  Final diagnoses:  Cervical radicular pain      NEW MEDICATIONS STARTED DURING THIS VISIT:  Discharge Medication List as of 11/28/2018  4:13 PM    START taking these medications   Details  predniSONE (STERAPRED UNI-PAK 21 TAB) 10 MG (21) TBPK tablet Take 6 pills on day one then decrease by 1 pill each day, Print         Note:  This document was prepared using Dragon voice recognition software and may include  unintentional dictation errors.    Faythe Ghee, PA-C 11/28/18 1734    Sharyn Creamer, MD 11/28/18 [phone]

## 2018-11-28 NOTE — ED Notes (Signed)
See triage note  States she developed neck pain on 2/24  Denies any injury  States pain is now into right shoulder and arm  Was seen at [facility]  But still having pain

## 2019-03-19 ENCOUNTER — Emergency Department: Admission: EM | Admit: 2019-03-19 | Discharge: 2019-03-20 | Discharge status: Against Medical Advice | Payer: Self-pay

## 2019-03-19 ENCOUNTER — Other Ambulatory Visit: Payer: Self-pay

## 2019-04-04 IMAGING — CT CT HEAD W/O CM
2 series · 16 of 30 positions shown, 20 images · non-contrast
Comparison: None.

CLINICAL DATA: 26-year-old female with severe headache and
hypertension in the setting of HELLP syndrome.

EXAM:
CT HEAD WITHOUT CONTRAST
TECHNIQUE: Contiguous axial images were obtained from the base of the skull
through the vertex without intravenous contrast.

[Series 2: routine head w/o · axial · non-contrast · 0.42mm/px · z∈[-34,+81]mm · 13 of 30 slices shown, 17 images]
[im 3/30  brain]
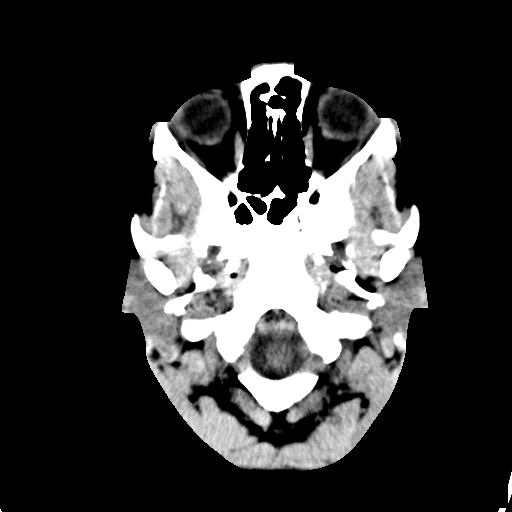
[im 3/30  bone]
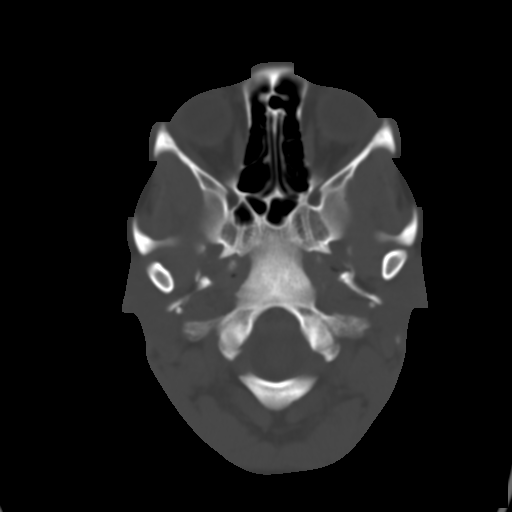
[im 5/30  brain]
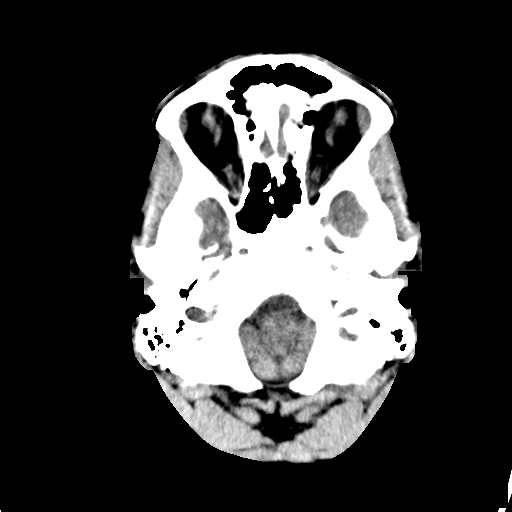
[im 7/30  brain]
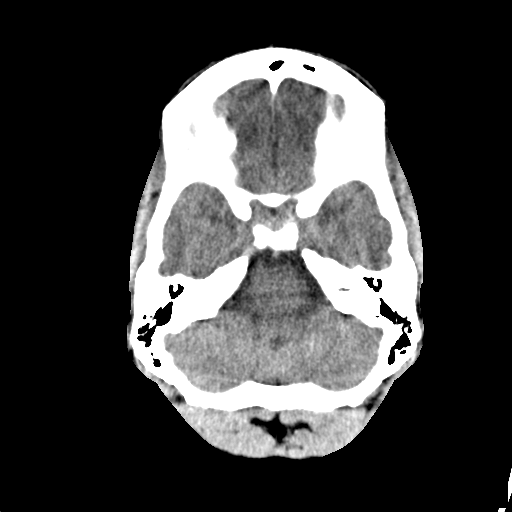
[im 9/30  brain]
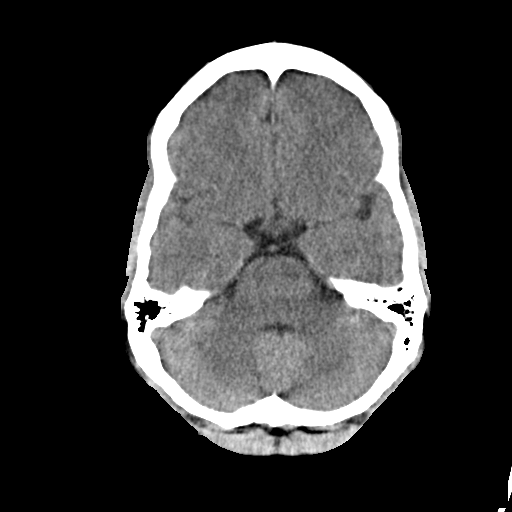
[im 11/30  brain]
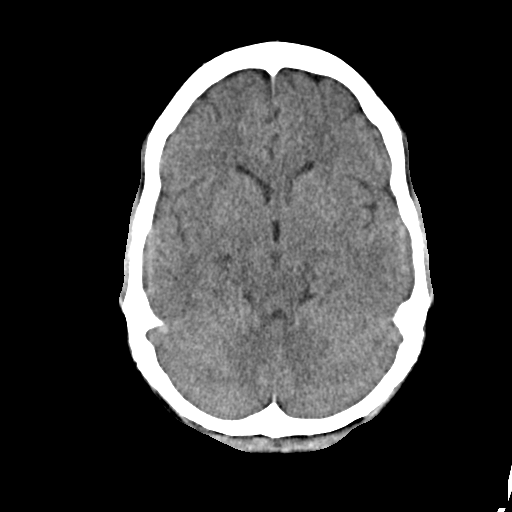
[im 11/30  bone]
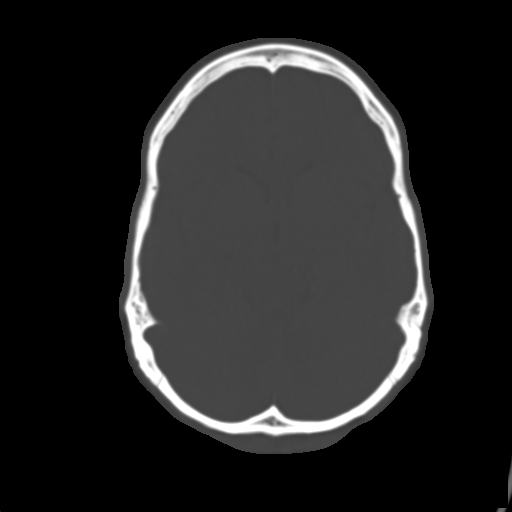
[im 13/30  brain]
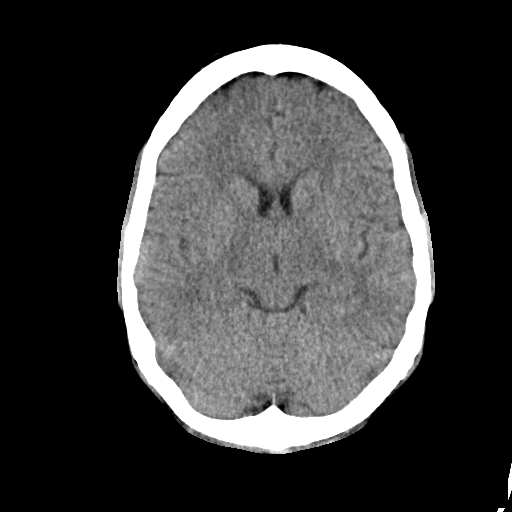
[im 15/30  brain]
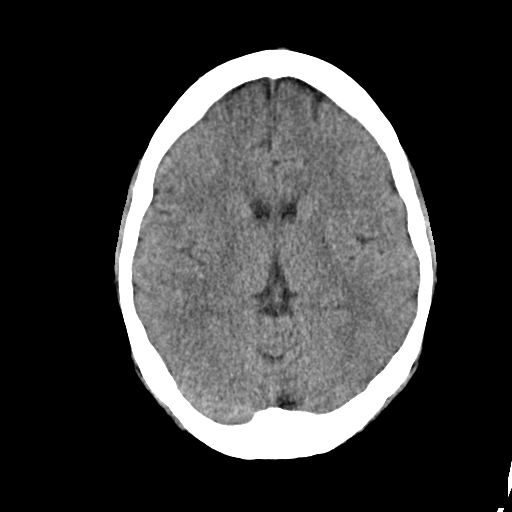
[im 17/30  brain]
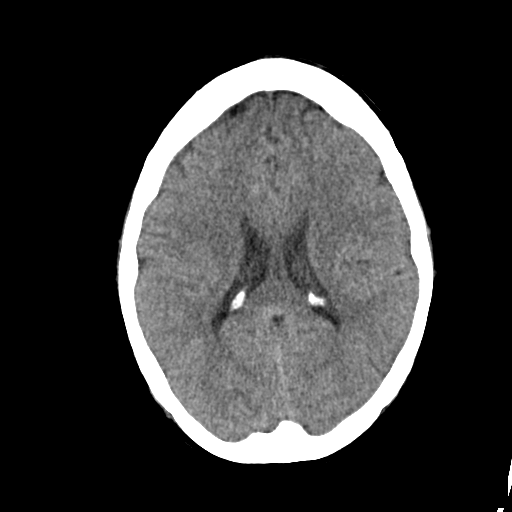
[im 19/30  brain]
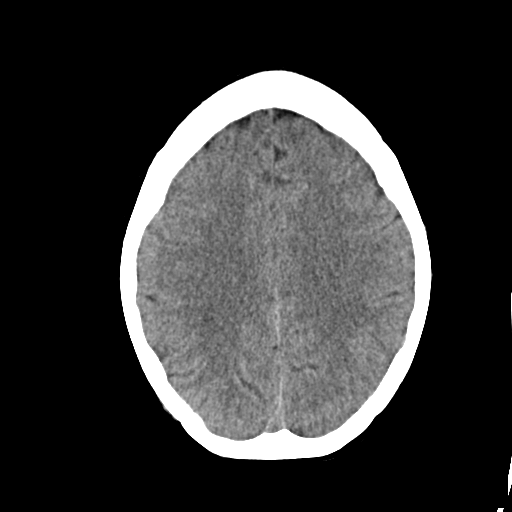
[im 19/30  bone]
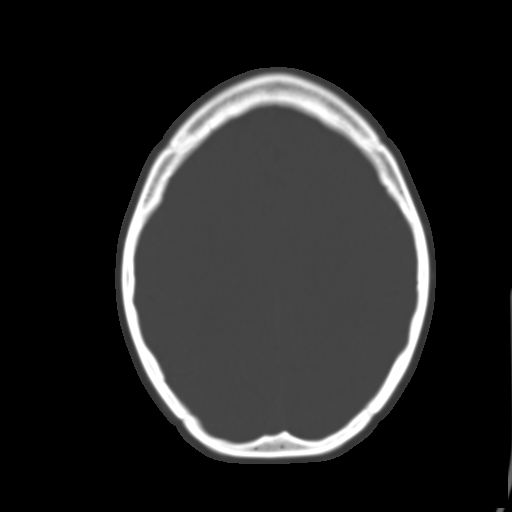
[im 21/30  brain]
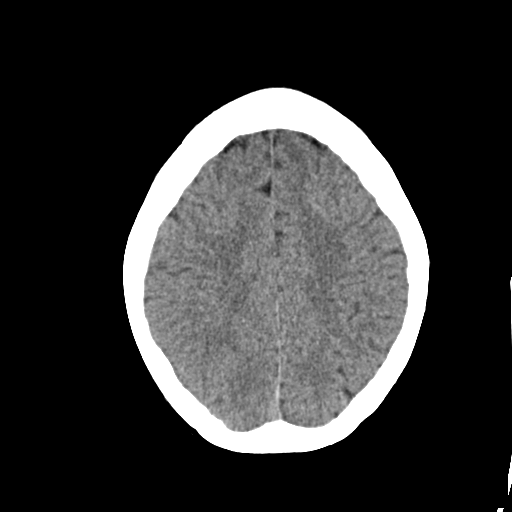
[im 23/30  brain]
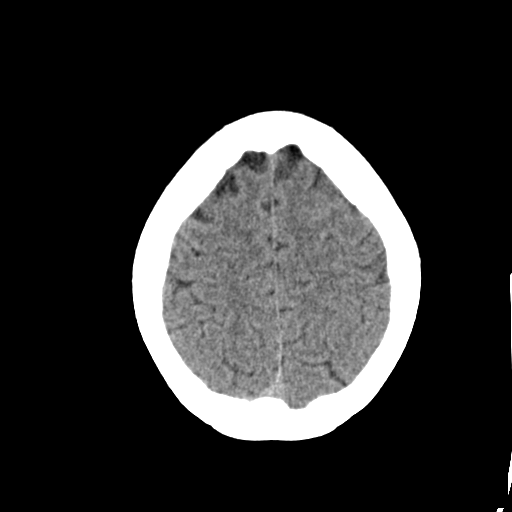
[im 25/30  brain]
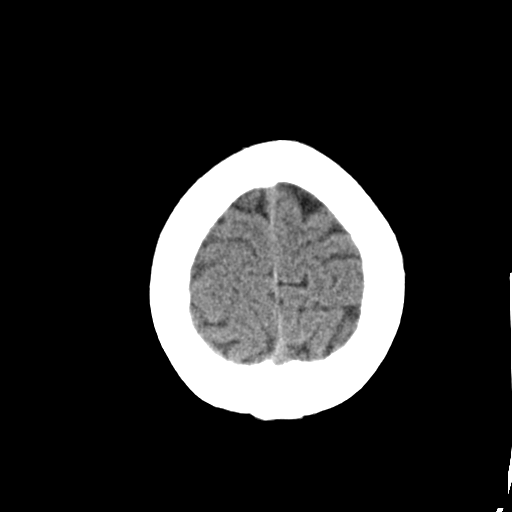
[im 27/30  brain]
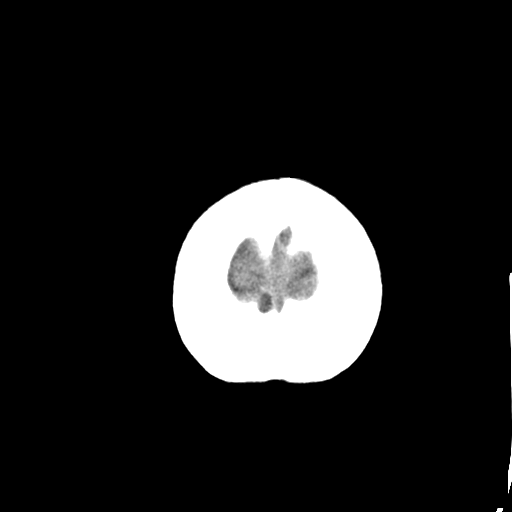
[im 27/30  bone]
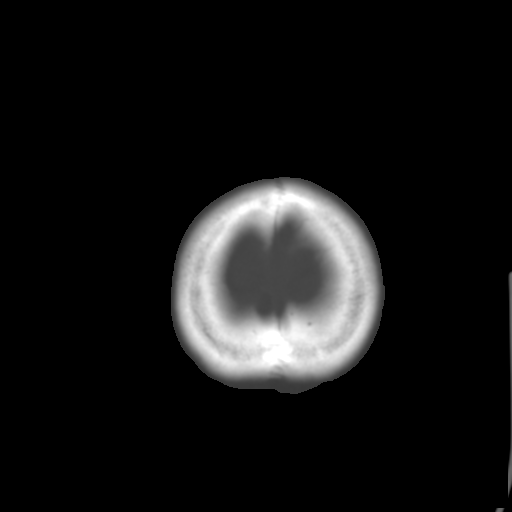

[Series 4: routine head bone · axial · 0.42mm/px · z∈[-34,+4]mm · 3 of 30 slices shown]
[im 3/30  bone]
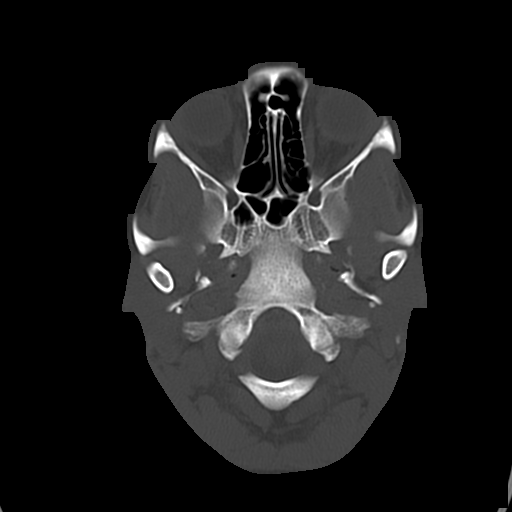
[im 7/30  bone]
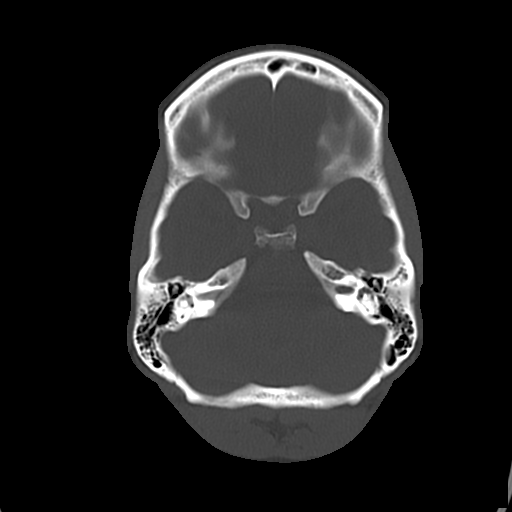
[im 11/30  bone]
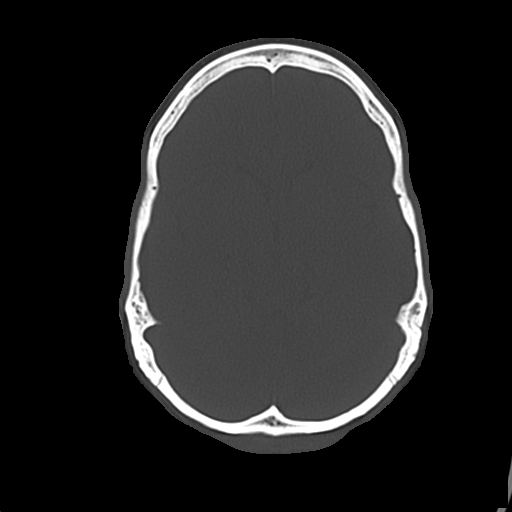

[16 of 30 positions shown; findings below may reference images not displayed]

FINDINGS: Brain: No evidence of acute infarction, hemorrhage, hydrocephalus,
extra-axial collection or mass lesion/mass effect.

Vascular: No hyperdense vessel or unexpected calcification.

Skull: Normal. Negative for fracture or focal lesion.

Sinuses/Orbits: No acute finding.

Other: None.
IMPRESSION: Negative head CT.

## 2019-08-29 ENCOUNTER — Other Ambulatory Visit: Payer: Self-pay

## 2019-08-29 DIAGNOSIS — Z20822 Contact with and (suspected) exposure to covid-19: Secondary | ICD-10-CM

## 2019-08-31 LAB — NOVEL CORONAVIRUS, NAA: SARS-CoV-2, NAA: DETECTED — AB

## 2019-09-04 ENCOUNTER — Encounter: Payer: Self-pay | Admitting: Pharmacy Technician

## 2019-09-04 NOTE — Progress Notes (Signed)
Patient no longer getting Feraheme from [facility] based on no longer getting treatment. Coverage dates 03/28/18-03/29/19. Last DOS covered is 04/04/18.

## 2019-10-01 ENCOUNTER — Other Ambulatory Visit: Payer: Self-pay

## 2019-10-01 ENCOUNTER — Ambulatory Visit (LOCAL_COMMUNITY_HEALTH_CENTER): Payer: Self-pay

## 2019-10-01 DIAGNOSIS — Z111 Encounter for screening for respiratory tuberculosis: Secondary | ICD-10-CM

## 2019-10-04 ENCOUNTER — Other Ambulatory Visit: Payer: Self-pay

## 2019-10-04 ENCOUNTER — Ambulatory Visit (LOCAL_COMMUNITY_HEALTH_CENTER): Payer: Self-pay

## 2019-10-04 DIAGNOSIS — Z111 Encounter for screening for respiratory tuberculosis: Secondary | ICD-10-CM

## 2019-10-04 LAB — TB SKIN TEST
Induration: 0 mm
TB Skin Test: NEGATIVE

## 2020-09-11 ENCOUNTER — Other Ambulatory Visit: Payer: Self-pay

## 2020-09-11 ENCOUNTER — Encounter: Payer: Self-pay | Admitting: Physician Assistant

## 2020-09-11 ENCOUNTER — Ambulatory Visit: Payer: Self-pay

## 2020-09-11 ENCOUNTER — Ambulatory Visit (LOCAL_COMMUNITY_HEALTH_CENTER): Payer: Self-pay | Admitting: Physician Assistant

## 2020-09-11 VITALS — BP 129/89 | Ht 65.0 in | Wt 143.0 lb

## 2020-09-11 DIAGNOSIS — Z3009 Encounter for other general counseling and advice on contraception: Secondary | ICD-10-CM

## 2020-09-11 DIAGNOSIS — Z113 Encounter for screening for infections with a predominantly sexual mode of transmission: Secondary | ICD-10-CM

## 2020-09-11 DIAGNOSIS — Z3201 Encounter for pregnancy test, result positive: Secondary | ICD-10-CM

## 2020-09-11 LAB — WET PREP FOR TRICH, YEAST, CLUE
Trichomonas Exam: NEGATIVE
Yeast Exam: NEGATIVE

## 2020-09-11 LAB — PREGNANCY, URINE: Preg Test, Ur: POSITIVE — AB

## 2020-09-11 MED ORDER — PRENATAL VITAMIN 27-0.8 MG PO TABS: 1.0000 | ORAL_TABLET | Freq: Every day | ORAL | 0 refills | Status: DC

## 2020-09-11 NOTE — Progress Notes (Signed)
Wet mount reviewed with provider and no treatment needed for wet mount per provider verbal order as pt is not having any symptoms. UPT is positive today. Proof of pregnancy given to pt, pt accepts PNV's today, positive UPT packet with resource list given to pt. Pt reports is unsure of what her plans for this pregnancy will be. Pt reports medical history of preeclampsia, HELP syndrome, HTN, and miscarriage and consulted with Sadie Haber, PA regarding pt's medical history and per Sadie Haber, PA if pt decides to continue with pregnancy she should get into prenatal care ASAP. Counseled pt per provider and pt states understanding. Counseled pt to monitor for any symptoms and seek treatment right away if develops any, or to go to ER with any medical emergencies and pt states understanding. Provider orders completed.

## 2020-09-11 NOTE — Progress Notes (Signed)
Pt was scheduled for FP Problem visit, but pt has not been seen for FP services at [facility] for a physical was 2013 per [facility] records. Offered physical to pt but pt declines and states does not have time to stay for full physical today but will get scheduled for one. Pt desires STI screening today and pregnancy test. Pt reports is interested in birth control if not pregnant.

## 2020-09-12 ENCOUNTER — Telehealth: Payer: Self-pay | Admitting: Family Medicine

## 2020-09-12 NOTE — Progress Notes (Signed)
[facility] problem visit  [facility]  Subjective:  Dominique Shea is a 29 y.o. being seen today requesting STD screening and a pregnancy test.  Chief Complaint  Patient presents with  . Contraception    STD screening and pregnancy test    HPI Patient states that she is not having any vaginal symptoms.  Denies nausea, vomiting, fatigue,and  breast tenderness .  Patient states that she was previously on Depo as her [facility] and if her pregnancy test today is negative she would like to start Depo.  Patient states she is aware that to continue with Depo she would need to RTC for an annual visit through [facility].  Reports that she has had 4 pregnancies, has 3 living children and had 1 miscarriage.  Does the patient have a current or past history of drug use? No   No components found for: HCV]   Health Maintenance Due  Topic Date Due  . COVID-19 Vaccine (1) Never done  . TETANUS/TDAP  Never done  . PAP-Cervical Cytology Screening  08/01/2016  . INFLUENZA VACCINE  Never done  . PAP SMEAR-Modifier  08/16/2020    Review of Systems  All other systems reviewed and are negative.   The following portions of the patient's history were reviewed and updated as appropriate: allergies, current medications, past family history, past medical history, past social history, past surgical history and problem list. Problem list updated.   See flowsheet for other program required questions.  Objective:   Vitals:   09/11/20 1403  BP: 129/89  Weight: 143 lb (64.9 kg)  Height: 5\' 5"  (1.651 m)    Physical Exam Vitals and nursing note reviewed.  Constitutional:      General: She is not in acute distress.    Appearance: Normal appearance.  HENT:     Head: Normocephalic and atraumatic.     Mouth/Throat:     Mouth: Mucous membranes are moist.     Pharynx: Oropharynx is clear. No oropharyngeal exudate or posterior oropharyngeal erythema.  Eyes:      Conjunctiva/sclera: Conjunctivae normal.  Pulmonary:     Effort: Pulmonary effort is normal.  Abdominal:     Palpations: Abdomen is soft. There is no mass.     Tenderness: There is no abdominal tenderness. There is no guarding or rebound.  Genitourinary:    General: Normal vulva.     Rectum: Normal.     Comments: External genitalia/pubic area without nits, lice, edema, erythema, lesions and inguinal adenopathy. Vagina with normal mucosa and small amount of brownish  discharge. Cervix without visible lesions. Uterus firm, mobile, nt, no masses, no CMT, no adnexal tenderness or fullness. Musculoskeletal:     Cervical back: Neck supple. No tenderness.  Skin:    General: Skin is warm and dry.     Findings: No bruising, erythema, lesion or rash.  Neurological:     Mental Status: She is alert and oriented to person, place, and time.  Psychiatric:        Mood and Affect: Mood normal.        Behavior: Behavior normal.        Thought Content: Thought content normal.        Judgment: Judgment normal.       Assessment and Plan:  Dominique Shea is a 29 y.o. female presenting to the [facility] Department for a Women's Health problem visit  1. Encounter for counseling regarding contraception Counseled/reviewed with patient SE of  Depo and when to call clinic for irregular bleeding. Enc condoms with all sex for STD protection and especially for 2 weeks after getting a first Depo shot. - Pregnancy, urine  2. Screening for STD (sexually transmitted disease) Await test results.  Counseled patient that RN will call if she needs to RTC for treatment once results are back.  - WET PREP FOR TRICH, YEAST, CLUE - Chlamydia/Gonorrhea [place] [facility] - HIV [place] [facility] - Syphilis Serology, [place] [facility]  3. Positive pregnancy test Pregnancy test today is positive. See RN note for counseling given. - Prenatal Vit-Fe Fumarate-FA (PRENATAL VITAMIN) 27-0.8 MG TABS; Take 1 tablet by mouth  daily.  Dispense: 100 tablet; Refill: 0     No follow-ups on file.  No future appointments.  Matt Holmes, PA

## 2020-09-12 NOTE — Telephone Encounter (Signed)
Patient stated that she wants to talk to [facility].

## 2020-09-12 NOTE — Telephone Encounter (Signed)
Call to patient number at 5:30 pm today.  No answer and left message on voicemail to call back on Monday, 09/15/2020, if she still needs to talk with me.

## 2020-09-12 NOTE — Telephone Encounter (Signed)
Call to patient at 11:10am today.  Patient did not answer and left message that patient can call to my voicemail until 12 pm and between 1 and 2 pm today to catch me or leave a message for me with a different time to call her.

## 2020-09-21 DIAGNOSIS — R109 Unspecified abdominal pain: Secondary | ICD-10-CM | POA: Diagnosis not present

## 2020-09-21 DIAGNOSIS — Z3A01 Less than 8 weeks gestation of pregnancy: Secondary | ICD-10-CM | POA: Diagnosis not present

## 2020-09-21 DIAGNOSIS — U071 COVID-19: Secondary | ICD-10-CM | POA: Diagnosis not present

## 2020-09-21 DIAGNOSIS — O009 Unspecified ectopic pregnancy without intrauterine pregnancy: Secondary | ICD-10-CM | POA: Diagnosis not present

## 2020-09-21 DIAGNOSIS — R102 Pelvic and perineal pain: Secondary | ICD-10-CM | POA: Diagnosis not present

## 2020-09-21 DIAGNOSIS — O98511 Other viral diseases complicating pregnancy, first trimester: Secondary | ICD-10-CM | POA: Diagnosis not present

## 2020-09-21 DIAGNOSIS — N857 Hematometra: Secondary | ICD-10-CM | POA: Diagnosis not present

## 2020-09-21 DIAGNOSIS — O99891 Other specified diseases and conditions complicating pregnancy: Secondary | ICD-10-CM | POA: Diagnosis not present

## 2020-09-21 DIAGNOSIS — Z302 Encounter for sterilization: Secondary | ICD-10-CM | POA: Diagnosis not present

## 2020-09-21 DIAGNOSIS — Z8619 Personal history of other infectious and parasitic diseases: Secondary | ICD-10-CM | POA: Diagnosis not present

## 2020-09-21 DIAGNOSIS — R1031 Right lower quadrant pain: Secondary | ICD-10-CM | POA: Diagnosis not present

## 2020-09-21 DIAGNOSIS — O00101 Right tubal pregnancy without intrauterine pregnancy: Secondary | ICD-10-CM | POA: Diagnosis not present

## 2020-09-22 DIAGNOSIS — O009 Unspecified ectopic pregnancy without intrauterine pregnancy: Secondary | ICD-10-CM | POA: Diagnosis not present

## 2020-09-22 DIAGNOSIS — Z302 Encounter for sterilization: Secondary | ICD-10-CM | POA: Diagnosis not present

## 2020-09-22 DIAGNOSIS — Z8619 Personal history of other infectious and parasitic diseases: Secondary | ICD-10-CM | POA: Diagnosis not present

## 2020-09-22 DIAGNOSIS — N857 Hematometra: Secondary | ICD-10-CM | POA: Diagnosis not present

## 2020-09-22 DIAGNOSIS — R109 Unspecified abdominal pain: Secondary | ICD-10-CM | POA: Diagnosis not present

## 2020-09-22 DIAGNOSIS — O00101 Right tubal pregnancy without intrauterine pregnancy: Secondary | ICD-10-CM | POA: Diagnosis not present

## 2020-09-30 ENCOUNTER — Telehealth: Payer: Self-pay | Admitting: Student

## 2020-09-30 NOTE — Telephone Encounter (Signed)
TC to patient. Verified ID via password/SS#. Informed of positive chlamydia and need for tx. Instructed to eat before visit and have partner call for tx appt. Appt scheduled. Sharlyne Pacas, RN

## 2020-10-01 ENCOUNTER — Ambulatory Visit: Payer: Self-pay

## 2020-10-01 ENCOUNTER — Other Ambulatory Visit: Payer: Self-pay

## 2020-10-01 DIAGNOSIS — A749 Chlamydial infection, unspecified: Secondary | ICD-10-CM

## 2020-10-01 MED ORDER — AZITHROMYCIN 500 MG PO TABS
1000.0000 mg | ORAL_TABLET | Freq: Once | ORAL | Status: AC
  Administered 2020-10-01: 1000 mg via ORAL

## 2020-10-01 NOTE — Progress Notes (Signed)
Pt here for chlamydia treatment. Reports ectopic preg and salpingectomy 09/22/2020 at [facility]. Allergy to Doxycycline. Denies allergy to azithromycin. Treated with Azithromycin 1 gram po DOT per [facility] Dr. Kirtland Bouchard. [place]. Instructions given to call [facility] for re treatment if vomits within 2 hrs. Jerel Shepherd, RN

## 2020-12-29 ENCOUNTER — Ambulatory Visit: Payer: Self-pay

## 2020-12-31 ENCOUNTER — Other Ambulatory Visit: Payer: Self-pay

## 2020-12-31 ENCOUNTER — Emergency Department
Admission: EM | Admit: 2020-12-31 | Discharge: 2020-12-31 | Disposition: A | Discharge status: Home / Self Care | Payer: Medicaid Other | Attending: Emergency Medicine | Admitting: Emergency Medicine

## 2020-12-31 DIAGNOSIS — M436 Torticollis: Secondary | ICD-10-CM | POA: Insufficient documentation

## 2020-12-31 DIAGNOSIS — Z87891 Personal history of nicotine dependence: Secondary | ICD-10-CM | POA: Insufficient documentation

## 2020-12-31 DIAGNOSIS — Z91010 Allergy to peanuts: Secondary | ICD-10-CM | POA: Diagnosis not present

## 2020-12-31 DIAGNOSIS — M542 Cervicalgia: Secondary | ICD-10-CM | POA: Diagnosis present

## 2020-12-31 MED ORDER — ORPHENADRINE CITRATE ER 100 MG PO TB12: 100.0000 mg | ORAL_TABLET | Freq: Two times a day (BID) | ORAL | 0 refills | Status: DC

## 2020-12-31 MED ORDER — ORPHENADRINE CITRATE 30 MG/ML IJ SOLN
60.0000 mg | Freq: Two times a day (BID) | INTRAMUSCULAR | Status: DC
  Administered 2020-12-31: 60 mg via INTRAMUSCULAR
  Filled 2020-12-31: qty 2

## 2020-12-31 MED ORDER — METHYLPREDNISOLONE 4 MG PO TBPK: ORAL_TABLET | ORAL | 0 refills | Status: DC

## 2020-12-31 MED ORDER — METHYLPREDNISOLONE SODIUM SUCC 125 MG IJ SOLR
125.0000 mg | Freq: Once | INTRAMUSCULAR | Status: AC
  Administered 2020-12-31: 125 mg via INTRAMUSCULAR
  Filled 2020-12-31: qty 2

## 2020-12-31 NOTE — ED Notes (Signed)
See triage note  Presents with neck "spasms"  States she has hx of same  Usually get as steroid shot and then a taper pack  This episode started couple of days ago w/o injury

## 2020-12-31 NOTE — Discharge Instructions (Addendum)
Follow discharge care instruction take medication as directed. °

## 2020-12-31 NOTE — ED Provider Notes (Signed)
[facility] Emergency Department Provider Note   ____________________________________________   Event Date/Time   First MD Initiated Contact with Patient 12/31/20 1002     (approximate)  I have reviewed the triage vital signs and the nursing notes.   HISTORY  Chief Complaint Neck Injury    HPI Terris Bodin is a 30 y.o. female patient presents with neck pain with radiculopathy to the right upper extremity.  Patient has a history of torticollis and last episode was 2 years ago.  Patient is here working with complaint approximately 4 to 5 days ago.  Patient states pain has increased.  Patient described the pain is "achy tightness".  Patient stated in the past she responded well to muscle relaxers and steroids.         Past Medical History:  Diagnosis Date  . 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 Anxiety   . 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 History of gonorrhea 2014  . Migraine   . Thrombocytopenia (HCC)    DURING FIRST PREG    Patient Active Problem List   Diagnosis Date Noted  . Iron deficiency anemia 03/23/2018  . Elevated blood pressure affecting pregnancy in second trimester, antepartum 01/14/2018  . Hemolysis, elevated liver enzymes, and low platelet (HELLP) syndrome during pregnancy, delivered, with postpartum complication 01/14/2018  . Unwanted fertility 01/14/2018  . Substance abuse affecting pregnancy, antepartum 09/26/2017  . Adjustment disorder with anxiety 09/08/2017  . Gonorrhea affecting pregnancy in first trimester 08/24/2017  . Thrombocytopenia (HCC) 07/12/2011    Past Surgical History:  Procedure Laterality Date  . CHOLECYSTECTOMY  2012   [facility]  . FRACTURE SURGERY      Prior to Admission medications   Medication Sig Start Date End Date Taking? Authorizing Provider  methylPREDNISolone (MEDROL DOSEPAK) 4 MG TBPK tablet Take Tapered dose as directed 12/31/20  Yes Joni Reining, PA-C  orphenadrine (NORFLEX) 100 MG tablet Take 1 tablet (100 mg total) by mouth 2 (two) times daily. 12/31/20  Yes Joni Reining, PA-C    Allergies Peanut-containing drug products, Tomato, Tylenol [acetaminophen], Doxycycline, Ibuprofen, and Ivp dye [iodinated diagnostic agents]  Family History  Problem Relation Age of Onset  . Hypertension Mother   . Migraines Mother   . Hypertension Father   . Migraines Father   . Migraines Sister   . Migraines Brother     Social History Social History   Tobacco Use  . Smoking status: Former Smoker    Packs/day: 1.00    Years: 4.00    Pack years: 4.00    Types: Cigarettes  . Smokeless tobacco: Never Used  . Tobacco comment: stopped when found out she was pregnant  Vaping Use  . Vaping Use: Never used  Substance Use Topics  . Alcohol use: Not Currently    Comment: occ  . Drug use: Not Currently    Types: Marijuana    Comment: last time was a couple months ago    Review of Systems Constitutional: No fever/chills Eyes: No visual changes. ENT: No sore throat. Cardiovascular: Denies chest pain. Respiratory: Denies shortness of breath. Gastrointestinal: No abdominal pain.  No nausea, no vomiting.  No diarrhea.  No constipation. Genitourinary: Negative for dysuria. Musculoskeletal: Neck and right shoulder pain. Skin: Negative for rash. Neurological: Negative for headaches, focal weakness or numbness. Psychiatric:  Anxiety. Allergic/Immunilogical: Doxycycline, ibuprofen, IVP dye, peanuts, and Tylenol. ____________________________________________   PHYSICAL EXAM:  VITAL SIGNS:  ED Triage Vitals  Enc Vitals Group     BP 12/31/20 0955 (!) 147/90     Pulse Rate 12/31/20 0955 73     Resp 12/31/20 0955 16     Temp 12/31/20 0955 98.3 F (36.8 C)     Temp Source 12/31/20 0955 Oral     SpO2 12/31/20 0955 100 %     Weight 12/31/20 0955 154 lb (69.9 kg)     Height 12/31/20 0955 5\' 4"  (1.626 m)     Head Circumference --       Peak Flow --      Pain Score 12/31/20 0945 8     Pain Loc --      Pain Edu? --      Excl. in GC? --    Constitutional: Alert and oriented. Well appearing and in no acute distress. Eyes: Conjunctivae are normal. PERRL. EOMI. Head: Atraumatic. Nose: No congestion/rhinnorhea. Mouth/Throat: Mucous membranes are moist.  Oropharynx non-erythematous. Neck: No stridor.  Decreased range of motion with left cervical lateral movements.  Cardiovascular: Normal rate, regular rhythm. Grossly normal heart sounds.  Good peripheral circulation. Respiratory: Normal respiratory effort.  No retractions. Lungs CTAB. Musculoskeletal: No lower extremity tenderness nor edema.  No joint effusions. Neurologic:  Normal speech and language. No gross focal neurologic deficits are appreciated. No gait instability. Skin:  Skin is warm, dry and intact. No rash noted. Psychiatric: Mood and affect are normal. Speech and behavior are normal.  ____________________________________________   LABS (all labs ordered are listed, but only abnormal results are displayed)  Labs Reviewed - No data to display ____________________________________________  EKG   ____________________________________________  RADIOLOGY I, 03/02/21, personally viewed and evaluated these images (plain radiographs) as part of my medical decision making, as well as reviewing the written report by the radiologist.  ED MD interpretation:    Official radiology report(s): No results found.  ____________________________________________   PROCEDURES  Procedure(s) performed (including Critical Care):  Procedures   ____________________________________________   INITIAL IMPRESSION / ASSESSMENT AND PLAN / ED COURSE  As part of my medical decision making, I reviewed the following data within the electronic MEDICAL RECORD NUMBER         Patient presents with right lateral neck pain for the component to the right upper extremity.   Patient has history of torticollis and states this complaint is similar.  Patient will be given IM injection of Norflex and Solu-Medrol.  Patient will be discharged with prescription for Norflex and a Medrol Dosepak advised patient to follow-up with orthopedics for definitive evaluation and treatment.      ____________________________________________   FINAL CLINICAL IMPRESSION(S) / ED DIAGNOSES  Final diagnoses:  Torticollis     ED Discharge Orders         Ordered    orphenadrine (NORFLEX) 100 MG tablet  2 times daily        12/31/20 1015    methylPREDNISolone (MEDROL DOSEPAK) 4 MG TBPK tablet        12/31/20 1015          *Please note:  Dominique Shea was evaluated in Emergency Department on 12/31/2020 for the symptoms described in the history of present illness. She was evaluated in the context of the global COVID-19 pandemic, which necessitated consideration that the patient might be at risk for infection with the SARS-CoV-2 virus that causes COVID-19. Institutional protocols and algorithms that pertain to the evaluation of patients at risk for COVID-19 are in a state of rapid  change based on information released by regulatory bodies including the CDC and federal and state organizations. These policies and algorithms were followed during the patient's care in the ED.  Some ED evaluations and interventions may be delayed as a result of limited staffing during and the pandemic.*   Note:  This document was prepared using Dragon voice recognition software and may include unintentional dictation errors.    Joni Reining, PA-C 12/31/20 1026    Sharman Cheek, MD 01/02/21 1921

## 2020-12-31 NOTE — ED Triage Notes (Signed)
Pt comes with c/o neck pain. Pt states she just needs a steroid shot for it.  Pt denies any injuries.

## 2021-01-07 ENCOUNTER — Ambulatory Visit: Payer: Medicaid Other

## 2021-01-11 ENCOUNTER — Other Ambulatory Visit: Payer: Self-pay

## 2021-01-11 ENCOUNTER — Encounter: Payer: Self-pay | Admitting: Physician Assistant

## 2021-01-11 ENCOUNTER — Emergency Department
Admission: EM | Admit: 2021-01-11 | Discharge: 2021-01-11 | Disposition: A | Discharge status: Home / Self Care | Payer: Medicaid Other | Attending: Emergency Medicine | Admitting: Emergency Medicine

## 2021-01-11 ENCOUNTER — Encounter: Payer: Self-pay | Admitting: Intensive Care

## 2021-01-11 DIAGNOSIS — Z91010 Allergy to peanuts: Secondary | ICD-10-CM | POA: Insufficient documentation

## 2021-01-11 DIAGNOSIS — M5412 Radiculopathy, cervical region: Secondary | ICD-10-CM | POA: Diagnosis not present

## 2021-01-11 DIAGNOSIS — M62838 Other muscle spasm: Secondary | ICD-10-CM | POA: Insufficient documentation

## 2021-01-11 DIAGNOSIS — M542 Cervicalgia: Secondary | ICD-10-CM | POA: Diagnosis present

## 2021-01-11 DIAGNOSIS — F17210 Nicotine dependence, cigarettes, uncomplicated: Secondary | ICD-10-CM | POA: Diagnosis not present

## 2021-01-11 MED ORDER — METHOCARBAMOL 500 MG PO TABS
750.0000 mg | ORAL_TABLET | Freq: Once | ORAL | Status: AC
  Administered 2021-01-11: 750 mg via ORAL
  Filled 2021-01-11: qty 2

## 2021-01-11 MED ORDER — KETOROLAC TROMETHAMINE 10 MG PO TABS: 10.0000 mg | ORAL_TABLET | Freq: Four times a day (QID) | ORAL | 0 refills | Status: AC | PRN

## 2021-01-11 MED ORDER — KETOROLAC TROMETHAMINE 60 MG/2ML IM SOLN
15.0000 mg | Freq: Once | INTRAMUSCULAR | Status: AC
  Administered 2021-01-11: 15 mg via INTRAMUSCULAR
  Filled 2021-01-11: qty 2

## 2021-01-11 MED ORDER — METHOCARBAMOL 750 MG PO TABS: 750.0000 mg | ORAL_TABLET | Freq: Four times a day (QID) | ORAL | 0 refills | Status: AC | PRN

## 2021-01-11 NOTE — ED Triage Notes (Signed)
C/o right sided neck pain/stiffness that flared up again two days. Recently seen on 12/31/20 for neck spasms and given steroids. Reports the medicines helped where she could move her neck some but now has gotten worse again. Reports the pain shoots down her right arm

## 2021-01-11 NOTE — ED Provider Notes (Signed)
[facility] Emergency Department Provider Note  ____________________________________________   Event Date/Time   First MD Initiated Contact with Patient 01/11/21 1504     (approximate)  I have reviewed the triage vital signs and the nursing notes.   HISTORY  Chief Complaint Neck Pain   HPI Dominique Shea is a 30 y.o. female who presents to the emergency department for evaluation of right-sided neck pain and radiation down the right arm.  She reports that there is a muscle spasm in her right shoulder region that is reducing the pain.  She was seen here on 12/31/2020 and treated with a steroid and muscle relaxant and she noted mild improvement with the medication, but states that she has used all of it and the pain has worsened again.  She reports history of the muscle spasms in the right shoulder over the last 2 years, but has only had the radiation of pain down the arm one time before which was approximately 2 years ago.  She did receive treatment with muscle relaxant and steroid at that time, but reports that it had greater success than the most recent.  She denies any new falls, trauma, MVAs or other inciting events.  Pain is worse with any turning of the neck, improved with rest.  She denies any fevers, chills or other systemic symptoms.         Past Medical History:  Diagnosis Date  . 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 Anxiety   . 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 History of gonorrhea 2014  . Migraine   . Thrombocytopenia (HCC)    DURING FIRST PREG    Patient Active Problem List   Diagnosis Date Noted  . Iron deficiency anemia 03/23/2018  . Elevated blood pressure affecting pregnancy in second trimester, antepartum 01/14/2018  . Hemolysis, elevated liver enzymes, and low platelet (HELLP) syndrome during pregnancy, delivered, with postpartum complication 01/14/2018  . Unwanted fertility 01/14/2018   . Substance abuse affecting pregnancy, antepartum 09/26/2017  . Adjustment disorder with anxiety 09/08/2017  . Gonorrhea affecting pregnancy in first trimester 08/24/2017  . Thrombocytopenia (HCC) 07/12/2011    Past Surgical History:  Procedure Laterality Date  . CHOLECYSTECTOMY  2012   [facility]  . FRACTURE SURGERY      Prior to Admission medications   Medication Sig Start Date End Date Taking? Authorizing Provider  ketorolac (TORADOL) 10 MG tablet Take 1 tablet (10 mg total) by mouth every 6 (six) hours as needed for up to 5 days. 01/11/21 01/16/21 Yes Pocahontas Cohenour, Ruben Gottron, PA  methocarbamol (ROBAXIN-750) 750 MG tablet Take 1 tablet (750 mg total) by mouth 4 (four) times daily as needed for up to 10 days for muscle spasms. 01/11/21 01/21/21 Yes Lucy Chris, PA  methylPREDNISolone (MEDROL DOSEPAK) 4 MG TBPK tablet Take Tapered dose as directed 12/31/20   Joni Reining, PA-C  orphenadrine (NORFLEX) 100 MG tablet Take 1 tablet (100 mg total) by mouth 2 (two) times daily. 12/31/20   Joni Reining, PA-C    Allergies Peanut-containing drug products, Tomato, Tylenol [acetaminophen], Doxycycline, Ibuprofen, and Ivp dye [iodinated diagnostic agents]  Family History  Problem Relation Age of Onset  . Hypertension Mother   . Migraines Mother   . Hypertension Father   . Migraines Father   . Migraines Sister   . Migraines Brother     Social History Social History   Tobacco Use  . Smoking  status: Current Every Day Smoker    Packs/day: 1.00    Years: 4.00    Pack years: 4.00    Types: Cigarettes  . Smokeless tobacco: Never Used  Vaping Use  . Vaping Use: Never used  Substance Use Topics  . Alcohol use: Yes    Comment: occ  . Drug use: Yes    Types: Marijuana    Review of Systems Constitutional: No fever/chills Eyes: No visual changes. ENT: No sore throat. Cardiovascular: Denies chest pain. Respiratory: Denies shortness of breath. Gastrointestinal: No abdominal pain.  No  nausea, no vomiting.  No diarrhea.  No constipation. Genitourinary: Negative for dysuria. Musculoskeletal: + Right shoulder pain with radiation down the right arm, negative for back pain. Skin: Negative for rash. Neurological: Negative for headaches, focal weakness or numbness.   ____________________________________________   PHYSICAL EXAM:  VITAL SIGNS: ED Triage Vitals  Enc Vitals Group     BP 01/11/21 1303 133/90     Pulse Rate 01/11/21 1303 66     Resp 01/11/21 1303 17     Temp 01/11/21 1303 98 F (36.7 C)     Temp Source 01/11/21 1303 Oral     SpO2 01/11/21 1303 100 %     Weight 01/11/21 1316 154 lb (69.9 kg)     Height 01/11/21 1316 5\' 4"  (1.626 m)     Head Circumference --      Peak Flow --      Pain Score 01/11/21 1316 10     Pain Loc --      Pain Edu? --      Excl. in GC? --     Constitutional: Alert and oriented. Well appearing and in no acute distress. Eyes: Conjunctivae are normal. PERRL. EOMI. Head: Atraumatic. Nose: No congestion/rhinnorhea. Mouth/Throat: Mucous membranes are moist.   Neck: No stridor.  There is no tenderness to the left paraspinal musculature or midline of the cervical spine.  There is tenderness to the right paraspinal musculature into the occipital insertion and lower into the right trapezius.  Decreased range of motion with lateral rotation to the right. Cardiovascular: Normal rate, regular rhythm. Grossly normal heart sounds.  Good peripheral circulation. Respiratory: Normal respiratory effort.  No retractions. Lungs CTAB. Gastrointestinal: Soft and nontender. No distention. No abdominal bruits. No CVA tenderness. Musculoskeletal: There is increased pain with palpation to the right upper trapezius and periscapular musculature.  Patient has 4/5 strength in shoulder flexion and abduction at 90 degrees secondary to pain.  She has 5/5 strength in flexion and abduction from 0 degrees as well as 5/5 strength in internal and external rotation.   Radial pulses 2+ bilaterally. Neurologic:  Normal speech and language. No gross focal neurologic deficits are appreciated. No gait instability. Skin:  Skin is warm, dry and intact. No rash noted. Psychiatric: Mood and affect are normal. Speech and behavior are normal.  ____________________________________________   INITIAL IMPRESSION / ASSESSMENT AND PLAN / ED COURSE  As part of my medical decision making, I reviewed the following data within the electronic MEDICAL RECORD NUMBER Nursing notes reviewed and incorporated and Notes from prior ED visits        Patient is a 30 year old female who presents to the emergency department for persistent right-sided neck pain and muscle spasm that initially improved with steroid and muscle relaxer but has now worsened again.  She denies any new trauma or other red flag symptoms of fever, chills or other systemic symptoms.  On physical exam, she does have  mildly limited strength when the shoulder is at 90 degrees secondary to pain, but she has appropriate strength when at 0 degrees and she is neurovascularly intact.  Discussed with the patient treatment options including anti-inflammatory, trial of a different muscle relaxer of Robaxin.  As well as at home use of Tylenol.  Patient does have a documented allergy for Tylenol, but she states this was added to her chart after the birth of her second child, after she was given some in the hospital resulting in some facial swelling.  However, she states that she has used this multiple times at home with no similar reaction.  She also has a documented allergy for ibuprofen, states that she had an episode of high blood pressure one time after taking ibuprofen, but regularly takes other anti-inflammatories and aspirins including Excedrin without difficulty.  Patient also denies any possibility that she could be pregnant.  Will trial course of Toradol, Robaxin and Tylenol.  Patient was given strict return precautions, and is  stable at time for outpatient follow-up.      ____________________________________________   FINAL CLINICAL IMPRESSION(S) / ED DIAGNOSES  Final diagnoses:  Trapezius muscle spasm  Cervical radiculopathy     ED Discharge Orders         Ordered    ketorolac (TORADOL) 10 MG tablet  Every 6 hours PRN        01/11/21 1529    methocarbamol (ROBAXIN-750) 750 MG tablet  4 times daily PRN        01/11/21 1529          *Please note:  Dominique Shea was evaluated in Emergency Department on 01/11/2021 for the symptoms described in the history of present illness. She was evaluated in the context of the global COVID-19 pandemic, which necessitated consideration that the patient might be at risk for infection with the SARS-CoV-2 virus that causes COVID-19. Institutional protocols and algorithms that pertain to the evaluation of patients at risk for COVID-19 are in a state of rapid change based on information released by regulatory bodies including the CDC and federal and state organizations. These policies and algorithms were followed during the patient's care in the ED.  Some ED evaluations and interventions may be delayed as a result of limited staffing during and the pandemic.*   Note:  This document was prepared using Dragon voice recognition software and may include unintentional dictation errors.   Lucy Chris, PA 01/11/21 1530    Merwyn Katos, MD 01/11/21 [id number]

## 2021-01-12 ENCOUNTER — Telehealth: Payer: Self-pay | Admitting: *Deleted

## 2021-01-12 NOTE — Telephone Encounter (Signed)
Transition Care Management Unsuccessful Follow-up Telephone Call  Date of discharge and from where:  01/11/2021 [facility] ED  Attempts:  1st Attempt  Reason for unsuccessful TCM follow-up call:  Left voice message

## 2021-01-13 ENCOUNTER — Emergency Department: Payer: Medicaid Other

## 2021-01-13 ENCOUNTER — Other Ambulatory Visit: Payer: Self-pay

## 2021-01-13 ENCOUNTER — Emergency Department
Admission: EM | Admit: 2021-01-13 | Discharge: 2021-01-13 | Disposition: A | Discharge status: Home / Self Care | Payer: Medicaid Other | Attending: Emergency Medicine | Admitting: Emergency Medicine

## 2021-01-13 DIAGNOSIS — M25511 Pain in right shoulder: Secondary | ICD-10-CM | POA: Diagnosis not present

## 2021-01-13 DIAGNOSIS — Z91010 Allergy to peanuts: Secondary | ICD-10-CM | POA: Diagnosis not present

## 2021-01-13 DIAGNOSIS — M50221 Other cervical disc displacement at C4-C5 level: Secondary | ICD-10-CM | POA: Diagnosis not present

## 2021-01-13 DIAGNOSIS — M542 Cervicalgia: Secondary | ICD-10-CM | POA: Diagnosis present

## 2021-01-13 DIAGNOSIS — M40202 Unspecified kyphosis, cervical region: Secondary | ICD-10-CM | POA: Diagnosis not present

## 2021-01-13 DIAGNOSIS — F17210 Nicotine dependence, cigarettes, uncomplicated: Secondary | ICD-10-CM | POA: Diagnosis not present

## 2021-01-13 DIAGNOSIS — M5020 Other cervical disc displacement, unspecified cervical region: Secondary | ICD-10-CM

## 2021-01-13 DIAGNOSIS — M4802 Spinal stenosis, cervical region: Secondary | ICD-10-CM | POA: Diagnosis not present

## 2021-01-13 DIAGNOSIS — M5412 Radiculopathy, cervical region: Secondary | ICD-10-CM | POA: Diagnosis not present

## 2021-01-13 MED ORDER — MORPHINE SULFATE (PF) 4 MG/ML IV SOLN
4.0000 mg | Freq: Once | INTRAVENOUS | Status: AC
  Administered 2021-01-13: 4 mg via INTRAVENOUS
  Filled 2021-01-13: qty 1

## 2021-01-13 MED ORDER — PREDNISONE 10 MG PO TABS: ORAL_TABLET | ORAL | 0 refills | Status: AC

## 2021-01-13 MED ORDER — LORAZEPAM 2 MG/ML IJ SOLN
1.0000 mg | INTRAMUSCULAR | Status: DC | PRN
  Administered 2021-01-13: 1 mg via INTRAVENOUS
  Filled 2021-01-13: qty 1

## 2021-01-13 MED ORDER — LIDOCAINE 5 % EX PTCH: 1.0000 | MEDICATED_PATCH | Freq: Two times a day (BID) | CUTANEOUS | 0 refills | Status: DC

## 2021-01-13 NOTE — ED Notes (Signed)
Awakened patient to discharge her. Pt alert and oriented X 4, stable for discharge. RR even and unlabored, color WNL. Discussed discharge instructions and follow-up as directed. Discharge medications discussed if prescribed. Pt had opportunity to ask questions, and RN to provide patient/family eduction. Pt obviously upset with plan for discharge despite having conversation with doctor at bedside about plan. Pt verbalized understanding of plan for discharge with doctor but then becomes upset with nurse when attempting to go over discharge paperwork. Using curse words.

## 2021-01-13 NOTE — Consult Note (Signed)
Neurosurgery-New Consultation Evaluation 01/13/2021 Dominique Shea [id number]  Identifying Statement: Dominique Shea is a 30 y.o. female from [place] [address] with right arm pain  Physician Requesting Consultation: [place] [facility]  History of Present Illness: Dominique Shea is here for evaluation of pain that she says is been going on for few weeks in the right shoulder and arm.  She does have a long history of neck pain for years and she has had previous right arm pain that resolved with steroids.  She states that this pain has come on down the past few weeks and is more debilitating.  She does not have any left arm symptoms.  She denies any numbness or direct weakness.  She denies any difficulty walking.  As part of the work-up for this in the emergency department, she was sent for an MRI of the cervical spine.  We are consulted for evaluation.  She has been given pain medication while in the emergency department.  Past Medical History:  Past Medical History:  Diagnosis Date  . 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 Anxiety   . 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 History of gonorrhea 2014  . Migraine   . Thrombocytopenia (HCC)    DURING FIRST PREG    Social History: Social History   Socioeconomic History  . Marital status: Single    Spouse name: Not on file  . Number of children: 2  . Years of education: GED  . Highest education level: Not on file  Occupational History  . Not on file  Tobacco Use  . Smoking status: Current Every Day Smoker    Packs/day: 1.00    Years: 4.00    Pack years: 4.00    Types: Cigarettes  . Smokeless tobacco: Never Used  Vaping Use  . Vaping Use: Never used  Substance and Sexual Activity  . Alcohol use: Yes    Comment: occ  . Drug use: Yes    Types: Marijuana  . Sexual activity: Not Currently    Birth control/protection: None  Other Topics Concern  . Not on file   Social History Narrative  . Not on file   Social Determinants of Health   Financial Resource Strain: Not on file  Food Insecurity: Not on file  Transportation Needs: Not on file  Physical Activity: Not on file  Stress: Not on file  Social Connections: Not on file  Intimate Partner Violence: Not At Risk  . Fear of Current or Ex-Partner: No  . Emotionally Abused: No  . Physically Abused: No  . Sexually Abused: No    Family History: Family History  Problem Relation Age of Onset  . Hypertension Mother   . Migraines Mother   . Hypertension Father   . Migraines Father   . Migraines Sister   . Migraines Brother     Review of Systems:  Review of Systems - General ROS: Negative Psychological ROS: Negative Ophthalmic ROS: Negative ENT ROS: Negative Hematological and Lymphatic ROS: Negative  Endocrine ROS: Negative Respiratory ROS: Negative Cardiovascular ROS: Negative Gastrointestinal ROS: Negative Genito-Urinary ROS: Negative Musculoskeletal ROS: Positive for neck pain Neurological ROS: Positive for right arm pain Dermatological ROS: Negative  Physical Exam: BP (!) 156/101 (BP Location: Left Arm)   Pulse 80   Temp 98 F (36.7 C)   Resp 18   Ht 5' (1.524 m)   Wt 69.9 kg   LMP 01/11/2021 (Exact Date)  SpO2 100%   BMI 30.08 kg/m  Body mass index is 30.08 kg/m. Body surface area is 1.72 meters squared. General appearance: Alert, cooperative, in no acute distress Head: Normocephalic, atraumatic Eyes: Normal, EOM intact Oropharynx: Wearing facemask Neck: Supple, full range of motion, mild tenderness to palpation over the right paramedian area Ext: No edema in LE bilaterally, warm extremities  Neurologic exam:  Mental status: alertness: alert, affect: normal Speech: fluent and clear Motor:strength symmetric 5/5 in bilateral deltoid, bicep, tricep, interossei.  She is 5-5 strength in bilateral lower extremities Sensory: intact to light touch in bilateral upper  and lower extremities Gait: Not tested  Laboratory: Results for orders placed or performed in visit on 09/11/20  WET PREP FOR TRICH, YEAST, CLUE  Result Value Ref Range   Trichomonas Exam Negative Negative   Yeast Exam Negative Negative   Clue Cell Exam Comment: Negative  Pregnancy, urine  Result Value Ref Range   Preg Test, Ur Positive (A) Negative   I personally reviewed labs  Imaging: MRI cervical spine: There is straightening of the lordotic curvature.  There is mild degenerative disease noted at multiple levels.  At C4-5 there is a small right paracentral disc with mild stenosis.  At C5-6 there is a larger disc fragment noted that migrated caudally causing right-sided stenosis.   Impression/Plan:  Dominique Shea is here for evaluation of right arm pain which appears to fit with a cervical radiculopathy.  Given the findings on the MRI, I do think this is in the C6 distribution.  We did discuss that being that is going on for a few weeks, she may respond to medical treatment and I did recommend a steroid taper.  We can follow-up as an outpatient if this does not improve over the next couple weeks, we could always discuss surgical intervention.  Given intact neurologic exam, she is cleared for discharge from the neurosurgery perspective.   1.  Diagnosis: Right cervical radiculopathy  2.  Plan  -Recommend steroid taper over the next week -Follow-up in clinic in 2 weeks

## 2021-01-13 NOTE — ED Triage Notes (Signed)
Pt comes with c/o right arm and shoulder pain. Pt states no relief with meds. Pt states some neck pain. Pt denies any recent injuries.

## 2021-01-13 NOTE — Telephone Encounter (Signed)
Transition Care Management Follow-up Telephone Call  Date of discharge and from where: 01/11/2021 [facility] ED  How have you been since you were released from the hospital? "Feeling a little better"  Any questions or concerns? No  Items Reviewed:  Did the pt receive and understand the discharge instructions provided? Yes   Medications obtained and verified? Yes   Other? No   Any new allergies since your discharge? No   Dietary orders reviewed? No  Do you have support at home? Yes    Functional Questionnaire: (I = Independent and D = Dependent) ADLs: I  Bathing/Dressing- I  Meal Prep- I  Eating- I  Maintaining continence- I  Transferring/Ambulation- I  Managing Meds- I  Follow up appointments reviewed:   PCP Hospital f/u appt confirmed? No  - no PCP  Specialist Hospital f/u appt confirmed? No    Are transportation arrangements needed? No   If their condition worsens, is the pt aware to call PCP or go to the Emergency Dept.? Yes  Was the patient provided with contact information for the PCP's office or ED? Yes  Was to pt encouraged to call back with questions or concerns? Yes

## 2021-01-13 NOTE — ED Provider Notes (Signed)
[facility] Emergency Department Provider Note   ____________________________________________   Event Date/Time   First MD Initiated Contact with Patient 01/13/21 [id number]     (approximate)  I have reviewed the triage vital signs and the nursing notes.   HISTORY  Chief Complaint Arm Injury    HPI Dominique Shea is a 30 y.o. female with past medical history of HELLP and polysubstance abuse who presents to the ED complaining of neck pain.  Patient reports that she has dealt with "neck spasms" for multiple years but that symptoms have gotten acutely worse over the past couple of weeks.  She describes sharp pain starting in the right side of her neck and radiating down her right arm.  It is worse with any movement of her neck or arm, but she denies any recent trauma.  She has started to notice some numbness and tingling in her right arm along with difficulty using the arm.  She has been taking p.o. Toradol along with Robaxin without significant relief of her symptoms.  She denies any fevers, cough, chest pain, or shortness of breath.  She has not had any numbness, weakness, or pain in her right leg.        Past Medical History:  Diagnosis Date  . 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 Anxiety   . 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 History of gonorrhea 2014  . Migraine   . Thrombocytopenia (HCC)    DURING FIRST PREG    Patient Active Problem List   Diagnosis Date Noted  . Iron deficiency anemia 03/23/2018  . Elevated blood pressure affecting pregnancy in second trimester, antepartum 01/14/2018  . Hemolysis, elevated liver enzymes, and low platelet (HELLP) syndrome during pregnancy, delivered, with postpartum complication 01/14/2018  . Unwanted fertility 01/14/2018  . Substance abuse affecting pregnancy, antepartum 09/26/2017  . Adjustment disorder with anxiety 09/08/2017  . Gonorrhea affecting pregnancy in  first trimester 08/24/2017  . Thrombocytopenia (HCC) 07/12/2011    Past Surgical History:  Procedure Laterality Date  . CHOLECYSTECTOMY  2012   [facility]  . FRACTURE SURGERY      Prior to Admission medications   Medication Sig Start Date End Date Taking? Authorizing Provider  lidocaine (LIDODERM) 5 % Place 1 patch onto the skin every 12 (twelve) hours. Remove & Discard patch within 12 hours or as directed by MD 01/13/21 01/13/22 Yes Chesley Noon, MD  predniSONE (DELTASONE) 10 MG tablet Take 6 tablets (60 mg total) by mouth daily for 2 days, THEN 5 tablets (50 mg total) daily for 2 days, THEN 4 tablets (40 mg total) daily for 2 days, THEN 3 tablets (30 mg total) daily for 2 days, THEN 2 tablets (20 mg total) daily for 2 days, THEN 1 tablet (10 mg total) daily for 2 days. 01/13/21 01/25/21 Yes Chesley Noon, MD  ketorolac (TORADOL) 10 MG tablet Take 1 tablet (10 mg total) by mouth every 6 (six) hours as needed for up to 5 days. 01/11/21 01/16/21  Lucy Chris, PA  methocarbamol (ROBAXIN-750) 750 MG tablet Take 1 tablet (750 mg total) by mouth 4 (four) times daily as needed for up to 10 days for muscle spasms. 01/11/21 01/21/21  Lucy Chris, PA  methylPREDNISolone (MEDROL DOSEPAK) 4 MG TBPK tablet Take Tapered dose as directed 12/31/20   Joni Reining, PA-C  orphenadrine (NORFLEX) 100 MG tablet Take 1 tablet (100 mg total) by mouth 2 (two)  times daily. 12/31/20   Joni Reining, PA-C    Allergies Peanut-containing drug products, Tomato, Tylenol [acetaminophen], Doxycycline, Ibuprofen, and Ivp dye [iodinated diagnostic agents]  Family History  Problem Relation Age of Onset  . Hypertension Mother   . Migraines Mother   . Hypertension Father   . Migraines Father   . Migraines Sister   . Migraines Brother     Social History Social History   Tobacco Use  . Smoking status: Current Every Day Smoker    Packs/day: 1.00    Years: 4.00    Pack years: 4.00    Types: Cigarettes  .  Smokeless tobacco: Never Used  Vaping Use  . Vaping Use: Never used  Substance Use Topics  . Alcohol use: Yes    Comment: occ  . Drug use: Yes    Types: Marijuana    Review of Systems  Constitutional: No fever/chills Eyes: No visual changes. ENT: No sore throat. Cardiovascular: Denies chest pain. Respiratory: Denies shortness of breath. Gastrointestinal: No abdominal pain.  No nausea, no vomiting.  No diarrhea.  No constipation. Genitourinary: Negative for dysuria. Musculoskeletal: Negative for back pain.  Positive for neck pain and right arm pain. Skin: Negative for rash. Neurological: Negative for headaches, focal weakness or numbness.  ____________________________________________   PHYSICAL EXAM:  VITAL SIGNS: ED Triage Vitals [01/13/21 0858]  Enc Vitals Group     BP (!) 138/101     Pulse Rate 72     Resp 18     Temp 98 F (36.7 C)     Temp src      SpO2 100 %     Weight 154 lb (69.9 kg)     Height 5' (1.524 m)     Head Circumference      Peak Flow      Pain Score 10     Pain Loc      Pain Edu?      Excl. in GC?     Constitutional: Alert and oriented. Eyes: Conjunctivae are normal. Head: Atraumatic. Nose: No congestion/rhinnorhea. Mouth/Throat: Mucous membranes are moist. Neck: Normal ROM, tenderness to palpation over right paraspinal cervical spine, no midline tenderness to palpation. Cardiovascular: Normal rate, regular rhythm. Grossly normal heart sounds.  2+ radial pulses bilaterally. Respiratory: Normal respiratory effort.  No retractions. Lungs CTAB. Gastrointestinal: Soft and nontender. No distention. Genitourinary: deferred Musculoskeletal: No lower extremity tenderness nor edema.  No tenderness to palpation along bilateral upper extremities. Neurologic:  Normal speech and language.  4 out of 5 strength in right upper extremity, otherwise 5 out of 5 strength in left upper extremity and bilateral lower extremities. Skin:  Skin is warm, dry and  intact. No rash noted. Psychiatric: Mood and affect are normal. Speech and behavior are normal.  ____________________________________________   LABS (all labs ordered are listed, but only abnormal results are displayed)  Labs Reviewed - No data to display   PROCEDURES  Procedure(s) performed (including Critical Care):  Procedures   ____________________________________________   INITIAL IMPRESSION / ASSESSMENT AND PLAN / ED COURSE       30 year old female with past medical history of HELLP and polysubstance abuse who presents to the ED complaining of acute on chronic pain extending from the middle of her neck down her right arm.  On exam, she does seem to have some weakness in her right upper extremity although exam is somewhat limited secondary to pain.  Symptoms seem most consistent with a cervical radiculopathy but given question of  weakness, we will further assess with MRI.  We will treat symptomatically with morphine.  She is vascularly intact to her right upper extremity with 2+ radial pulse, no signs of infectious process.  MRI shows cervical disc disease resulting in a cervical radiculopathy.  Patient was evaluated by Dr. Adriana Simas of neurosurgery, who states she is appropriate for discharge home with medical management using steroids for now and follow-up in the neurosurgery clinic.  We will add Lidoderm patches in addition to steroid taper, patient already taking NSAIDs and muscle relaxants.  She was counseled to return to the ED for new worsening symptoms, patient agrees with plan.      ____________________________________________   FINAL CLINICAL IMPRESSION(S) / ED DIAGNOSES  Final diagnoses:  Cervical disc herniation  Neck pain     ED Discharge Orders         Ordered    predniSONE (DELTASONE) 10 MG tablet        01/13/21 1256    lidocaine (LIDODERM) 5 %  Every 12 hours        01/13/21 1257           Note:  This document was prepared using Dragon voice  recognition software and may include unintentional dictation errors.   Chesley Noon, MD 01/13/21 1300

## 2021-01-14 DIAGNOSIS — M5020 Other cervical disc displacement, unspecified cervical region: Secondary | ICD-10-CM | POA: Diagnosis not present

## 2021-01-14 DIAGNOSIS — M5412 Radiculopathy, cervical region: Secondary | ICD-10-CM | POA: Diagnosis not present

## 2021-01-15 ENCOUNTER — Telehealth: Payer: Self-pay

## 2021-01-15 NOTE — Telephone Encounter (Signed)
Transition Care Management Unsuccessful Follow-up Telephone Call  Date of discharge and from where:  01/14/2021 from [facility]  Attempts:  1st Attempt  Reason for unsuccessful TCM follow-up call:  Unable to leave message

## 2021-01-16 NOTE — Telephone Encounter (Addendum)
Transition Care Management Follow-up Telephone Call  Date of discharge and from where: 01/14/2021 from [facility]  How have you been since you were released from the hospital? Pt states that her neck pain has not improved. Pt has an appt to see a neurosurgeon.  Any questions or concerns? No  Items Reviewed:  Did the pt receive and understand the discharge instructions provided? Yes   Medications obtained and verified? Yes   Other? No   Any new allergies since your discharge? No   Dietary orders reviewed? n/a  Do you have support at home? Yes   Functional Questionnaire: (I = Independent and D = Dependent) ADLs: I  Bathing/Dressing- I  Meal Prep- I  Eating- I  Maintaining continence- I  Transferring/Ambulation- I  Managing Meds- I   Follow up appointments reviewed:   PCP Hospital f/u appt confirmed? No    Specialist Hospital f/u appt confirmed? Yes  Scheduled to see Lucy Chris, MD on 01/27/2021 @ 2:30pm.  Are transportation arrangements needed? No   If their condition worsens, is the pt aware to call PCP or go to the Emergency Dept.? Yes  Was the patient provided with contact information for the PCP's office or ED? Yes  Was to pt encouraged to call back with questions or concerns? Yes

## 2021-01-18 DIAGNOSIS — M5412 Radiculopathy, cervical region: Secondary | ICD-10-CM | POA: Diagnosis not present

## 2021-01-18 DIAGNOSIS — M542 Cervicalgia: Secondary | ICD-10-CM | POA: Diagnosis not present

## 2021-01-18 DIAGNOSIS — R200 Anesthesia of skin: Secondary | ICD-10-CM | POA: Diagnosis not present

## 2021-01-19 ENCOUNTER — Telehealth: Payer: Self-pay

## 2021-01-19 NOTE — Telephone Encounter (Signed)
Transition Care Management Follow-up Telephone Call  Date of discharge and from where: 01/18/2021 from [facility]  How have you been since you were released from the hospital? Pt stated that she is still experiencing pain. Pt has an appt with neurosurgery   Any questions or concerns? No  Items Reviewed:  Did the pt receive and understand the discharge instructions provided? Yes   Medications obtained and verified? Yes   Other? No   Any new allergies since your discharge? No   Dietary orders reviewed? n/a  Do you have support at home? Yes   Functional Questionnaire: (I = Independent and D = Dependent) ADLs: I  Bathing/Dressing- I  Meal Prep- I  Eating- I  Maintaining continence- I  Transferring/Ambulation- I  Managing Meds- I   Follow up appointments reviewed:   PCP Hospital f/u appt confirmed? No    Specialist Hospital f/u appt confirmed? Yes  Scheduled to see Lucy Chris, MD on 01/27/2021 @ 2:30pm.  Are transportation arrangements needed? No   If their condition worsens, is the pt aware to call PCP or go to the Emergency Dept.? Yes  Was the patient provided with contact information for the PCP's office or ED? Yes  Was to pt encouraged to call back with questions or concerns? Yes

## 2021-01-20 ENCOUNTER — Emergency Department
Admission: EM | Admit: 2021-01-20 | Discharge: 2021-01-20 | Disposition: A | Discharge status: Home / Self Care | Payer: Medicaid Other | Attending: Emergency Medicine | Admitting: Emergency Medicine

## 2021-01-20 ENCOUNTER — Other Ambulatory Visit: Payer: Self-pay

## 2021-01-20 DIAGNOSIS — M5412 Radiculopathy, cervical region: Secondary | ICD-10-CM | POA: Insufficient documentation

## 2021-01-20 DIAGNOSIS — Z91010 Allergy to peanuts: Secondary | ICD-10-CM | POA: Diagnosis not present

## 2021-01-20 DIAGNOSIS — M79601 Pain in right arm: Secondary | ICD-10-CM | POA: Diagnosis present

## 2021-01-20 DIAGNOSIS — F17210 Nicotine dependence, cigarettes, uncomplicated: Secondary | ICD-10-CM | POA: Insufficient documentation

## 2021-01-20 MED ORDER — KETOROLAC TROMETHAMINE 60 MG/2ML IM SOLN
60.0000 mg | Freq: Once | INTRAMUSCULAR | Status: AC
  Administered 2021-01-20: 60 mg via INTRAMUSCULAR
  Filled 2021-01-20: qty 2

## 2021-01-20 NOTE — ED Notes (Signed)
See triage note Presents with right arm pain  States pain started yesterday  No injury

## 2021-01-20 NOTE — ED Provider Notes (Signed)
[facility] Emergency Department Provider Note  ____________________________________________   Event Date/Time   First MD Initiated Contact with Patient 01/20/21 1333     (approximate)  I have reviewed the triage vital signs and the nursing notes.   HISTORY  Chief Complaint Arm Pain    HPI Dominique Shea is a 30 y.o. female presents emergency department complaining of right arm pain.  Nursing staff thought it started yesterday but the patient has had this since she is complained of neck pain.  She has cervical radiculopathy.  Patient states arm has a sharp burning sensation from the area above the elbow to right below the elbow.  States she is taking all the medications she was told to.  She has a prescription for prednisone, gabapentin, Robaxin, and ran out of her oxycodone.  States that she began to drop objects while at work and her boss has become concerned that she needs a earlier follow-up due to the disc in her neck.    Past Medical History:  Diagnosis Date  . 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 Anxiety   . 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 History of gonorrhea 2014  . Migraine   . Thrombocytopenia (HCC)    DURING FIRST PREG    Patient Active Problem List   Diagnosis Date Noted  . Iron deficiency anemia 03/23/2018  . Elevated blood pressure affecting pregnancy in second trimester, antepartum 01/14/2018  . Hemolysis, elevated liver enzymes, and low platelet (HELLP) syndrome during pregnancy, delivered, with postpartum complication 01/14/2018  . Unwanted fertility 01/14/2018  . Substance abuse affecting pregnancy, antepartum 09/26/2017  . Adjustment disorder with anxiety 09/08/2017  . Gonorrhea affecting pregnancy in first trimester 08/24/2017  . Thrombocytopenia (HCC) 07/12/2011    Past Surgical History:  Procedure Laterality Date  . CHOLECYSTECTOMY  2012   [facility]  . FRACTURE SURGERY       Prior to Admission medications   Medication Sig Start Date End Date Taking? Authorizing Provider  lidocaine (LIDODERM) 5 % Place 1 patch onto the skin every 12 (twelve) hours. Remove & Discard patch within 12 hours or as directed by MD 01/13/21 01/13/22  Chesley Noon, MD  methocarbamol (ROBAXIN-750) 750 MG tablet Take 1 tablet (750 mg total) by mouth 4 (four) times daily as needed for up to 10 days for muscle spasms. 01/11/21 01/21/21  Lucy Chris, PA  orphenadrine (NORFLEX) 100 MG tablet Take 1 tablet (100 mg total) by mouth 2 (two) times daily. 12/31/20   Joni Reining, PA-C  predniSONE (DELTASONE) 10 MG tablet Take 6 tablets (60 mg total) by mouth daily for 2 days, THEN 5 tablets (50 mg total) daily for 2 days, THEN 4 tablets (40 mg total) daily for 2 days, THEN 3 tablets (30 mg total) daily for 2 days, THEN 2 tablets (20 mg total) daily for 2 days, THEN 1 tablet (10 mg total) daily for 2 days. 01/13/21 01/25/21  Chesley Noon, MD    Allergies Peanut-containing drug products, Tomato, Tylenol [acetaminophen], Doxycycline, Ibuprofen, and Ivp dye [iodinated diagnostic agents]  Family History  Problem Relation Age of Onset  . Hypertension Mother   . Migraines Mother   . Hypertension Father   . Migraines Father   . Migraines Sister   . Migraines Brother     Social History Social History   Tobacco Use  . Smoking status: Current Every Day Smoker    Packs/day: 1.00  Years: 4.00    Pack years: 4.00    Types: Cigarettes  . Smokeless tobacco: Never Used  Vaping Use  . Vaping Use: Never used  Substance Use Topics  . Alcohol use: Yes    Comment: occ  . Drug use: Yes    Types: Marijuana    Review of Systems  Constitutional: No fever/chills Eyes: No visual changes. ENT: No sore throat. Respiratory: Denies cough Cardiovascular: Denies chest pain Gastrointestinal: Denies abdominal pain Genitourinary: Negative for dysuria. Musculoskeletal: Negative for back pain. Skin:  Negative for rash. Psychiatric: no mood changes,     ____________________________________________   PHYSICAL EXAM:  VITAL SIGNS: ED Triage Vitals  Enc Vitals Group     BP 01/20/21 1311 (!) 132/107     Pulse Rate 01/20/21 1311 69     Resp 01/20/21 1311 18     Temp 01/20/21 1311 97.7 F (36.5 C)     Temp Source 01/20/21 1311 Oral     SpO2 01/20/21 1311 98 %     Weight --      Height --      Head Circumference --      Peak Flow --      Pain Score 01/20/21 1313 10     Pain Loc --      Pain Edu? --      Excl. in GC? --     Constitutional: Alert and oriented. Well appearing and in no acute distress. Eyes: Conjunctivae are normal.  Head: Atraumatic. Nose: No congestion/rhinnorhea. Mouth/Throat: Mucous membranes are moist.   Neck:  supple no lymphadenopathy noted Cardiovascular: Normal rate, regular rhythm.  Respiratory: Normal respiratory effort.  No retractions,  GU: deferred Musculoskeletal: FROM all extremities, warm and well perfused, patient still has good grip sensation bilaterally, pain is reproduced with movement of the arm, neurovascular is intact Neurologic:  Normal speech and language.  Skin:  Skin is warm, dry and intact. No rash noted. Psychiatric: Mood and affect are normal. Speech and behavior are normal.  ____________________________________________   LABS (all labs ordered are listed, but only abnormal results are displayed)  Labs Reviewed - No data to display ____________________________________________   ____________________________________________  RADIOLOGY    ____________________________________________   PROCEDURES  Procedure(s) performed: No  Procedures    ____________________________________________   INITIAL IMPRESSION / ASSESSMENT AND PLAN / ED COURSE  Pertinent labs & imaging results that were available during my care of the patient were reviewed by me and considered in my medical decision making (see chart for  details).   Patient is a 30 year old female presents with right arm pain.  Patient has history of cervical radiculopathy and bulging disc.  See HPI.  Physical exam shows patient per stable  I do not feel at this time there is a whole lot the ER can do for her.  We gave her a injection of Toradol.  She seems to think that injections and IV medications works so much better for her than oral medications.  However I did explain to her that gabapentin and muscle relaxers take a little bit longer to work and this is normal.  She has an appointment with Dr. Adriana Simas at [facility] on May 1.  I have sent him a secure message to see if he could push her appointment up.  The concern I have at this time is she is starting to drop objects which means she is losing control of her grip in her hand while at work.  I explained to  her that if I do not hear back from him today that she can call his office and see if anyone has made a cancellation.   Secure message chat with Dr. Adriana Simas, he does not have any clinic this week, states he will see her Monday morning and will discuss surgery.  This was conveyed to the patient.  She is to return if worsening.  Discharged in stable condition.  Berdie Malter was evaluated in Emergency Department on 01/20/2021 for the symptoms described in the history of present illness. She was evaluated in the context of the global COVID-19 pandemic, which necessitated consideration that the patient might be at risk for infection with the SARS-CoV-2 virus that causes COVID-19. Institutional protocols and algorithms that pertain to the evaluation of patients at risk for COVID-19 are in a state of rapid change based on information released by regulatory bodies including the CDC and federal and state organizations. These policies and algorithms were followed during the patient's care in the ED.    As part of my medical decision making, I reviewed the following data within the electronic MEDICAL RECORD NUMBER  Nursing notes reviewed and incorporated, Old chart reviewed, Notes from prior ED visits and [place] Controlled Substance Database  ____________________________________________   FINAL CLINICAL IMPRESSION(S) / ED DIAGNOSES  Final diagnoses:  Cervical radiculopathy      NEW MEDICATIONS STARTED DURING THIS VISIT:  Discharge Medication List as of 01/20/2021  2:20 PM       Note:  This document was prepared using Dragon voice recognition software and may include unintentional dictation errors.    Faythe Ghee, PA-C 01/20/21 1441    Merwyn Katos, MD 01/25/21 [phone]

## 2021-01-20 NOTE — ED Triage Notes (Signed)
Pt come with c/o right arm pain that started yesterday. Pt denies any recent injuries.

## 2021-01-20 NOTE — ED Notes (Signed)
Pt has been provided with discharge instructions. Pt denies any questions or concerns at this time. Pt verbalizes understanding for follow up care and d/c.  VSS.  Pt left department with all belongings.  

## 2021-01-21 ENCOUNTER — Telehealth: Payer: Self-pay

## 2021-01-21 NOTE — Telephone Encounter (Signed)
Transition Care Management Follow-up Telephone Call  Date of discharge and from where: 01/20/21 from [facility]  How have you been since you were released from the hospital? Pt stated that she is still experiencing pain. Pt has an appt with neurosurgery   Any questions or concerns? No  Items Reviewed:  Did the pt receive and understand the discharge instructions provided? Yes   Medications obtained and verified? Yes   Other? No   Any new allergies since your discharge? No   Dietary orders reviewed? n/a  Do you have support at home? Yes   Functional Questionnaire: (I = Independent and D = Dependent) ADLs: I  Bathing/Dressing- I  Meal Prep- I  Eating- I  Maintaining continence- I  Transferring/Ambulation- I  Managing Meds- I   Follow up appointments reviewed:   PCP Hospital f/u appt confirmed? No    Specialist Hospital f/u appt confirmed? Yes  Scheduled to see Dominique Chris, MD on 01/27/2021 @ 2:30pm.  Are transportation arrangements needed? No   If their condition worsens, is the pt aware to call PCP or go to the Emergency Dept.? Yes  Was the patient provided with contact information for the PCP's office or ED? Yes  Was to pt encouraged to call back with questions or concerns? Yes

## 2021-01-23 DIAGNOSIS — R9431 Abnormal electrocardiogram [ECG] [EKG]: Secondary | ICD-10-CM | POA: Diagnosis not present

## 2021-01-23 DIAGNOSIS — M50221 Other cervical disc displacement at C4-C5 level: Secondary | ICD-10-CM | POA: Diagnosis not present

## 2021-01-23 DIAGNOSIS — D696 Thrombocytopenia, unspecified: Secondary | ICD-10-CM | POA: Diagnosis not present

## 2021-01-23 DIAGNOSIS — R200 Anesthesia of skin: Secondary | ICD-10-CM | POA: Diagnosis not present

## 2021-01-23 DIAGNOSIS — M4802 Spinal stenosis, cervical region: Secondary | ICD-10-CM | POA: Diagnosis not present

## 2021-01-26 ENCOUNTER — Emergency Department: Payer: Medicaid Other

## 2021-01-26 ENCOUNTER — Other Ambulatory Visit: Payer: Self-pay

## 2021-01-26 ENCOUNTER — Telehealth: Payer: Self-pay

## 2021-01-26 ENCOUNTER — Encounter: Payer: Self-pay | Admitting: *Deleted

## 2021-01-26 ENCOUNTER — Emergency Department
Admission: EM | Admit: 2021-01-26 | Discharge: 2021-01-26 | Disposition: A | Discharge status: Home / Self Care | Payer: Medicaid Other | Attending: Student in an Organized Health Care Education/Training Program | Admitting: Student in an Organized Health Care Education/Training Program

## 2021-01-26 DIAGNOSIS — M79621 Pain in right upper arm: Secondary | ICD-10-CM | POA: Diagnosis not present

## 2021-01-26 DIAGNOSIS — R079 Chest pain, unspecified: Secondary | ICD-10-CM | POA: Diagnosis not present

## 2021-01-26 DIAGNOSIS — M4802 Spinal stenosis, cervical region: Secondary | ICD-10-CM | POA: Diagnosis not present

## 2021-01-26 DIAGNOSIS — M50221 Other cervical disc displacement at C4-C5 level: Secondary | ICD-10-CM | POA: Diagnosis not present

## 2021-01-26 DIAGNOSIS — M5412 Radiculopathy, cervical region: Secondary | ICD-10-CM | POA: Insufficient documentation

## 2021-01-26 DIAGNOSIS — M79601 Pain in right arm: Secondary | ICD-10-CM

## 2021-01-26 DIAGNOSIS — M50223 Other cervical disc displacement at C6-C7 level: Secondary | ICD-10-CM | POA: Diagnosis not present

## 2021-01-26 DIAGNOSIS — R0789 Other chest pain: Secondary | ICD-10-CM | POA: Diagnosis not present

## 2021-01-26 DIAGNOSIS — Z91010 Allergy to peanuts: Secondary | ICD-10-CM | POA: Insufficient documentation

## 2021-01-26 DIAGNOSIS — F17210 Nicotine dependence, cigarettes, uncomplicated: Secondary | ICD-10-CM | POA: Diagnosis not present

## 2021-01-26 DIAGNOSIS — M50222 Other cervical disc displacement at C5-C6 level: Secondary | ICD-10-CM | POA: Diagnosis not present

## 2021-01-26 LAB — BASIC METABOLIC PANEL
Anion gap: 11 (ref 5–15)
BUN: 13 mg/dL (ref 6–20)
CO2: 20 mmol/L — ABNORMAL LOW (ref 22–32)
Calcium: 9.5 mg/dL (ref 8.9–10.3)
Chloride: 106 mmol/L (ref 98–111)
Creatinine, Ser: 0.84 mg/dL (ref 0.44–1.00)
GFR, Estimated: >60 mL/min (ref >60)
Glucose, Bld: 105 mg/dL — ABNORMAL HIGH (ref 70–99)
Potassium: 3.6 mmol/L (ref 3.5–5.1)
Sodium: 137 mmol/L (ref 135–145)

## 2021-01-26 LAB — CBC
HCT: 42.1 % (ref 36.0–46.0)
Hemoglobin: 14.5 g/dL (ref 12.0–15.0)
MCH: 31.6 pg (ref 26.0–34.0)
MCHC: 34.4 g/dL (ref 30.0–36.0)
MCV: 91.7 fL (ref 80.0–100.0)
Platelets: 132 10*3/uL — ABNORMAL LOW (ref 150–400)
RBC: 4.59 MIL/uL (ref 3.87–5.11)
RDW: 12.7 % (ref 11.5–15.5)
WBC: 10 10*3/uL (ref 4.0–10.5)
nRBC: 0 % (ref 0.0–0.2)

## 2021-01-26 LAB — POC URINE PREG, ED: Preg Test, Ur: NEGATIVE

## 2021-01-26 LAB — TROPONIN I (HIGH SENSITIVITY): Troponin I (High Sensitivity): 4 ng/L (ref <18)

## 2021-01-26 MED ORDER — OXYCODONE-ACETAMINOPHEN 5-325 MG PO TABS: 1.0000 | ORAL_TABLET | Freq: Once | ORAL | Status: DC

## 2021-01-26 MED ORDER — ONDANSETRON HCL 4 MG/2ML IJ SOLN
4.0000 mg | Freq: Once | INTRAMUSCULAR | Status: AC
  Administered 2021-01-26: 4 mg via INTRAVENOUS
  Filled 2021-01-26: qty 2

## 2021-01-26 MED ORDER — MORPHINE SULFATE (PF) 4 MG/ML IV SOLN
4.0000 mg | INTRAVENOUS | Status: DC | PRN
  Administered 2021-01-26: 4 mg via INTRAVENOUS
  Filled 2021-01-26: qty 1

## 2021-01-26 MED ORDER — KETOROLAC TROMETHAMINE 30 MG/ML IJ SOLN
15.0000 mg | Freq: Once | INTRAMUSCULAR | Status: AC
  Administered 2021-01-26: 15 mg via INTRAVENOUS
  Filled 2021-01-26: qty 1

## 2021-01-26 MED ORDER — OXYCODONE HCL 5 MG PO TABS
5.0000 mg | ORAL_TABLET | ORAL | Status: DC | PRN
  Administered 2021-01-26: 5 mg via ORAL
  Filled 2021-01-26: qty 1

## 2021-01-26 MED ORDER — METHYLPREDNISOLONE 4 MG PO TBPK: ORAL_TABLET | ORAL | 0 refills | Status: DC

## 2021-01-26 MED ORDER — OXYCODONE HCL 5 MG PO TABS: 5.0000 mg | ORAL_TABLET | Freq: Three times a day (TID) | ORAL | 0 refills | Status: DC | PRN

## 2021-01-26 NOTE — ED Provider Notes (Signed)
[facility] Emergency Department Provider Note    Event Date/Time   First MD Initiated Contact with Patient 01/26/21 1239     (approximate)  I have reviewed the triage vital signs and the nursing notes.   HISTORY  Chief Complaint Arm Pain    HPI Dominique Shea is a 30 y.o. female below listed past medical history presents to the ER for evaluation of progressively worsening right arm pain and weakness in her hand.  Having trouble picking things up.  Also complained of chest pain today.  States that chest pain occurs after she is having shooting discomfort in her right hand.  States she supposed to have neurosurgery of follow-up tomorrow but has had to reschedule.  Denies any fevers.  No shortness of breath.  States that the weakness on the hand has worsened since MRI.   Past Medical History:  Diagnosis Date  . 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 Anxiety   . 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 History of gonorrhea 2014  . Migraine   . Thrombocytopenia (HCC)    DURING FIRST PREG   Family History  Problem Relation Age of Onset  . Hypertension Mother   . Migraines Mother   . Hypertension Father   . Migraines Father   . Migraines Sister   . Migraines Brother    Past Surgical History:  Procedure Laterality Date  . CHOLECYSTECTOMY  2012   [facility]  . ECTOPIC PREGNANCY SURGERY    . FRACTURE SURGERY     Patient Active Problem List   Diagnosis Date Noted  . Iron deficiency anemia 03/23/2018  . Elevated blood pressure affecting pregnancy in second trimester, antepartum 01/14/2018  . Hemolysis, elevated liver enzymes, and low platelet (HELLP) syndrome during pregnancy, delivered, with postpartum complication 01/14/2018  . Unwanted fertility 01/14/2018  . Substance abuse affecting pregnancy, antepartum 09/26/2017  . Adjustment disorder with anxiety 09/08/2017  . Gonorrhea affecting pregnancy in first  trimester 08/24/2017  . Thrombocytopenia (HCC) 07/12/2011      Prior to Admission medications   Medication Sig Start Date End Date Taking? Authorizing Provider  DULoxetine (CYMBALTA) 30 MG capsule Take 30 mg by mouth daily. 01/23/21  Yes [provider]  gabapentin (NEURONTIN) 300 MG capsule Take 1 capsule by mouth 3 (three) times daily. 01/14/21  Yes [provider]  lidocaine (LIDODERM) 5 % Place 1 patch onto the skin every 12 (twelve) hours. Remove & Discard patch within 12 hours or as directed by MD 01/13/21 01/13/22 Yes Chesley Noon, MD  methocarbamol (ROBAXIN) 500 MG tablet Take by mouth.   Yes [provider]  methylPREDNISolone (MEDROL DOSEPAK) 4 MG TBPK tablet 1 pak 01/26/21  Yes Willy Eddy, MD  oxyCODONE (OXY IR/ROXICODONE) 5 MG immediate release tablet Take 5 mg by mouth every 4 (four) hours as needed. 01/18/21  Yes [provider]  orphenadrine (NORFLEX) 100 MG tablet Take 1 tablet (100 mg total) by mouth 2 (two) times daily. Patient not taking: No sig reported 12/31/20   Joni Reining, PA-C    Allergies Peanut-containing drug products, Tomato, Tylenol [acetaminophen], Doxycycline, Ibuprofen, and Ivp dye [iodinated diagnostic agents]    Social History Social History   Tobacco Use  . Smoking status: Current Every Day Smoker    Packs/day: 1.00    Years: 4.00    Pack years: 4.00    Types: Cigarettes  . Smokeless tobacco: Never Used  Vaping  Use  . Vaping Use: Never used  Substance Use Topics  . Alcohol use: Yes    Comment: occ  . Drug use: Yes    Types: Marijuana    Review of Systems Patient denies headaches, rhinorrhea, blurry vision, numbness, shortness of breath, chest pain, edema, cough, abdominal pain, nausea, vomiting, diarrhea, dysuria, fevers, rashes or hallucinations unless otherwise stated above in HPI. ____________________________________________   PHYSICAL EXAM:  VITAL SIGNS: Vitals:   01/26/21 1112 01/26/21  1455  BP: (!) 138/109 (!) 132/94  Pulse: 83 82  Resp: 20 18  Temp: 98 F (36.7 C) 98 F (36.7 C)  SpO2: 99% 99%    Constitutional: Alert and oriented.  Eyes: Conjunctivae are normal.  Head: Atraumatic. Nose: No congestion/rhinnorhea. Mouth/Throat: Mucous membranes are moist.   Neck: No stridor. Painless ROM.  Cardiovascular: Normal rate, regular rhythm. Grossly normal heart sounds.  Good peripheral circulation. Respiratory: Normal respiratory effort.  No retractions. Lungs CTAB. Gastrointestinal: Soft and nontender. No distention. No abdominal bruits. No CVA tenderness. Genitourinary:  Musculoskeletal: No lower extremity tenderness nor edema.  No joint effusions. Neurologic:  Normal speech and language. No gross focal neurologic deficits are appreciated. No facial droop Skin:  Skin is warm, dry and intact. No rash noted. Psychiatric: Mood and affect are normal. Speech and behavior are normal.  ____________________________________________   LABS (all labs ordered are listed, but only abnormal results are displayed)  Results for orders placed or performed during the hospital encounter of 01/26/21 (from the past 24 hour(s))  Basic metabolic panel     Status: Abnormal   Collection Time: 01/26/21 11:50 AM  Result Value Ref Range   Sodium 137 135 - 145 mmol/L   Potassium 3.6 3.5 - 5.1 mmol/L   Chloride 106 98 - 111 mmol/L   CO2 20 (L) 22 - 32 mmol/L   Glucose, Bld 105 (H) 70 - 99 mg/dL   BUN 13 6 - 20 mg/dL   Creatinine, Ser 1.82 0.44 - 1.00 mg/dL   Calcium 9.5 8.9 - 99.3 mg/dL   GFR, Estimated >71 >69 mL/min   Anion gap 11 5 - 15  CBC     Status: Abnormal   Collection Time: 01/26/21 11:50 AM  Result Value Ref Range   WBC 10.0 4.0 - 10.5 K/uL   RBC 4.59 3.87 - 5.11 MIL/uL   Hemoglobin 14.5 12.0 - 15.0 g/dL   HCT 67.8 93.8 - 10.1 %   MCV 91.7 80.0 - 100.0 fL   MCH 31.6 26.0 - 34.0 pg   MCHC 34.4 30.0 - 36.0 g/dL   RDW 75.1 02.5 - 85.2 %   Platelets 132 (L) 150 - 400  K/uL   nRBC 0.0 0.0 - 0.2 %  Troponin I (High Sensitivity)     Status: None   Collection Time: 01/26/21 11:50 AM  Result Value Ref Range   Troponin I (High Sensitivity) 4 <18 ng/L  POC urine preg, ED     Status: None   Collection Time: 01/26/21  1:07 PM  Result Value Ref Range   Preg Test, Ur Negative Negative   ____________________________________________  EKG My review and personal interpretation at Time: 11:32   Indication: right arm pain  Rate: 70  Rhythm: sinus Axis: normal Other: normal intervals, no stemi ____________________________________________  RADIOLOGY  I personally reviewed all radiographic images ordered to evaluate for the above acute complaints and reviewed radiology reports and findings.  These findings were personally discussed with the patient.  Please see medical  record for radiology report.  ____________________________________________   PROCEDURES  Procedure(s) performed:  Procedures    Critical Care performed: no ____________________________________________   INITIAL IMPRESSION / ASSESSMENT AND PLAN / ED COURSE  Pertinent labs & imaging results that were available during my care of the patient were reviewed by me and considered in my medical decision making (see chart for details).   DDX: radiculopathy, fracture, msk strain, cervical strain  Dominique Shea is a 30 y.o. who presents to the ED with presentation as described above.  Given weakness and symptoms MRI of cervical spine ordered.  Presentation consistent with a C6 radiculopathy secondary to lateral disc protrusion.  Based on her presentation notified neurosurgery of MRI results and her presentation.  She has close follow-up up with them tomorrow morning.  Will give steroid taper pain medication.  Is appropriate for outpatient follow-up.     The patient was evaluated in Emergency Department today for the symptoms described in the history of present illness. He/she was evaluated in the  context of the global COVID-19 pandemic, which necessitated consideration that the patient might be at risk for infection with the SARS-CoV-2 virus that causes COVID-19. Institutional protocols and algorithms that pertain to the evaluation of patients at risk for COVID-19 are in a state of rapid change based on information released by regulatory bodies including the CDC and federal and state organizations. These policies and algorithms were followed during the patient's care in the ED.  As part of my medical decision making, I reviewed the following data within the electronic MEDICAL RECORD NUMBER Nursing notes reviewed and incorporated, Labs reviewed, notes from prior ED visits and [place] Controlled Substance Database   ____________________________________________   FINAL CLINICAL IMPRESSION(S) / ED DIAGNOSES  Final diagnoses:  Right arm pain  C6 radiculopathy      NEW MEDICATIONS STARTED DURING THIS VISIT:  New Prescriptions   METHYLPREDNISOLONE (MEDROL DOSEPAK) 4 MG TBPK TABLET    1 pak     Note:  This document was prepared using Dragon voice recognition software and may include unintentional dictation errors.    Willy Eddy, MD 01/26/21 1524

## 2021-01-26 NOTE — ED Notes (Signed)
Provided DC instructions. Verbalized understanding.  

## 2021-01-26 NOTE — ED Triage Notes (Signed)
Patient c/o right arm pain that began today and radiates over the chest. Patient c/o nausea and vomited x2.

## 2021-01-26 NOTE — Telephone Encounter (Signed)
Transition Care Management Follow-up Telephone Call  Date of discharge and from where: 01/23/2021 from [facility]  How have you been since you were released from the hospital? Pt stated that she is in severe pain.   Any questions or concerns? Yes   EMS is on the way to patient now due to severe pain in chest and sob.

## 2021-01-27 ENCOUNTER — Telehealth: Payer: Self-pay | Admitting: *Deleted

## 2021-01-27 DIAGNOSIS — G959 Disease of spinal cord, unspecified: Secondary | ICD-10-CM | POA: Diagnosis not present

## 2021-01-27 NOTE — Telephone Encounter (Signed)
Transition Care Management Unsuccessful Follow-up Telephone Call  Date of discharge and from where:  01/26/2021 [facility] ED  Attempts:  1st Attempt  Reason for unsuccessful TCM follow-up call:  Voice mail full

## 2021-01-28 ENCOUNTER — Other Ambulatory Visit: Payer: Self-pay | Admitting: Neurosurgery

## 2021-01-28 NOTE — Telephone Encounter (Signed)
Transition Care Management Follow-up Telephone Call  Date of discharge and from where: 01/26/2021 [facility] ED  How have you been since you were released from the hospital? "Better"  Any questions or concerns? No  Items Reviewed:  Did the pt receive and understand the discharge instructions provided? Yes   Medications obtained and verified? Yes   Other? No   Any new allergies since your discharge? No   Dietary orders reviewed? No  Do you have support at home? Yes    Functional Questionnaire: (I = Independent and D = Dependent) ADLs: I  Bathing/Dressing- I  Meal Prep- I  Eating- I  Maintaining continence- I  Transferring/Ambulation- I  Managing Meds- I  Follow up appointments reviewed:   PCP Hospital f/u appt confirmed? No    Specialist Hospital f/u appt confirmed? No    Are transportation arrangements needed? N/A  If their condition worsens, is the pt aware to call PCP or go to the Emergency Dept.? Yes  Was the patient provided with contact information for the PCP's office or ED? Yes  Was to pt encouraged to call back with questions or concerns? Yes

## 2021-02-03 DIAGNOSIS — M47812 Spondylosis without myelopathy or radiculopathy, cervical region: Secondary | ICD-10-CM | POA: Diagnosis not present

## 2021-02-03 DIAGNOSIS — M5412 Radiculopathy, cervical region: Secondary | ICD-10-CM | POA: Diagnosis not present

## 2021-02-04 ENCOUNTER — Other Ambulatory Visit
Admission: RE | Admit: 2021-02-04 | Discharge: 2021-02-04 | Disposition: A | Discharge status: Home / Self Care | Payer: Medicaid Other | Source: Ambulatory Visit | Attending: Neurosurgery | Admitting: Neurosurgery

## 2021-02-04 ENCOUNTER — Other Ambulatory Visit: Payer: Self-pay

## 2021-02-04 DIAGNOSIS — Z01810 Encounter for preprocedural cardiovascular examination: Secondary | ICD-10-CM | POA: Diagnosis not present

## 2021-02-04 DIAGNOSIS — Z01818 Encounter for other preprocedural examination: Secondary | ICD-10-CM | POA: Diagnosis present

## 2021-02-04 DIAGNOSIS — R9431 Abnormal electrocardiogram [ECG] [EKG]: Secondary | ICD-10-CM | POA: Insufficient documentation

## 2021-02-04 HISTORY — DX: Anemia, unspecified: D64.9

## 2021-02-04 LAB — URINALYSIS, ROUTINE W REFLEX MICROSCOPIC
Bilirubin Urine: NEGATIVE
Glucose, UA: NEGATIVE mg/dL
Hgb urine dipstick: NEGATIVE
Ketones, ur: NEGATIVE mg/dL
Leukocytes,Ua: NEGATIVE
Nitrite: NEGATIVE
Protein, ur: NEGATIVE mg/dL
Specific Gravity, Urine: 1.018 (ref 1.005–1.030)
pH: 7 (ref 5.0–8.0)

## 2021-02-04 LAB — PROTIME-INR
INR: 1.1 (ref 0.8–1.2)
Prothrombin Time: 14.2 seconds (ref 11.4–15.2)

## 2021-02-04 LAB — SURGICAL PCR SCREEN
MRSA, PCR: NEGATIVE
Staphylococcus aureus: NEGATIVE

## 2021-02-04 LAB — TYPE AND SCREEN
ABO/RH(D): A POS
Antibody Screen: NEGATIVE

## 2021-02-04 LAB — APTT: aPTT: 35 seconds (ref 24–36)

## 2021-02-04 NOTE — Pre-Procedure Instructions (Signed)
EKG My review and personal interpretation at Time: 11:32   Indication: right arm pain  Rate: 70  Rhythm: sinus Axis: normal Other: normal intervals, no stemi ____________________________________________  RADIOLOGY  I personally reviewed all radiographic images ordered to evaluate for the above acute complaints and reviewed radiology reports and findings.  These findings were personally discussed with the patient.  Please see medical record for radiology report.  ____________________________________________   PROCEDURES  Procedure(s) performed:  Procedures    Critical Care performed: no ____________________________________________   INITIAL IMPRESSION / ASSESSMENT AND PLAN / ED COURSE  Pertinent labs & imaging results that were available during my care of the patient were reviewed by me and considered in my medical decision making (see chart for details).  DDX: radiculopathy, fracture, msk strain, cervical strain  Cassadi Purdie is a 30 y.o. who presents to the ED with presentation as described above.  Given weakness and symptoms MRI of cervical spine ordered.  Presentation consistent with a C6 radiculopathy secondary to lateral disc protrusion.  Based on her presentation notified neurosurgery of MRI results and her presentation.  She has close follow-up up with them tomorrow morning.  Will give steroid taper pain medication.  Is appropriate for outpatient follow-up.   The patient was evaluated in Emergency Department today for the symptoms described in the history of present illness. He/she was evaluated in the context of the global COVID-19 pandemic, which necessitated consideration that the patient might be at risk for infection with the SARS-CoV-2 virus that causes COVID-19. Institutional protocols and algorithms that pertain to the evaluation of patients at risk for COVID-19 are in a state of rapid change based on information released by regulatory bodies  including the CDC and federal and state organizations. These policies and algorithms were followed during the patient's care in the ED.  As part of my medical decision making, I reviewed the following data within the electronic MEDICAL RECORD NUMBERNursing notes reviewed and incorporated, Labs reviewed, notes from prior ED visits and [place] Controlled Substance Database   ____________________________________________   FINAL CLINICAL IMPRESSION(S) / ED DIAGNOSES  Final diagnoses:  Right arm pain  C6 radiculopathy      NEW MEDICATIONS STARTED DURING THIS VISIT:      New Prescriptions   METHYLPREDNISOLONE (MEDROL DOSEPAK) 4 MG TBPK TABLET    1 pak     Note:  This document was prepared using Dragon voice recognition software and may include unintentional dictation errors.    Willy Eddy, MD 01/26/21 1524         Electronically signed by Willy Eddy, MD at 01/26/2021 3:24 PM  ED on 01/26/2021      ED on 01/26/2021         Detailed Report       Note shared with patient    Additional Orders and Documentation     Results  Imaging      Meds         Orders         Flowsheets      Encounter Info:  History,   Allergies,   Detailed Report       Clinical Impressions     Right arm pain     C6 radiculopathy    Disposition     Discharge           ED After Visit Summary (Printed 01/26/2021)          Follow-Ups: Follow up with Lucy Chris, MD (Neurosurgery); follow up in clinic tomorrow  Medication Changes     methylPREDNISolone 4 MG 1 pak     oxyCODONE HCl        5 mg Oral Every 4 hours PRN     5 mg Oral Every 8 hours PRN      Medication List at Discharge   Care Timeline  05/02    1033  Arrived  1132  ED EKG  1145  DG Chest 2 View  1150  Basic metabolic panel     CBC     Troponin I (High Sensitivity)  1305  Ondansetron HCl 4 mg  1306  Morphine  Sulfate 4 mg  1307  POC urine preg, ED  1337  MR Cervical Spine Wo Contrast  1501  oxyCODONE HCl 5 mg  1545  Ketorolac Tromethamine 15 mg  1556  Discharged  05/03    1223  EKG

## 2021-02-04 NOTE — Patient Instructions (Signed)
Your procedure is scheduled on:02-09-21 MONDAY Report to the Registration Desk on the 1st floor of the Medical Mall To find out your arrival time, please call [phone] between 1PM - 3PM on:02-06-21 FRIDAY  REMEMBER: Instructions that are not followed completely may result in serious medical risk, up to and including death; or upon the discretion of your surgeon and anesthesiologist your surgery may need to be rescheduled.  Do not eat food after midnight the night before surgery.  No gum chewing, lozengers or hard candies.  You may however, drink CLEAR liquids up to 2 hours before you are scheduled to arrive for your surgery. Do not drink anything within 2 hours of your scheduled arrival time.  Clear liquids include: - water  - apple juice without pulp - gatorade - black coffee or tea (Do NOT add milk or creamers to the coffee or tea) Do NOT drink anything that is not on this list.  TAKE THESE MEDICATIONS THE MORNING OF SURGERY WITH A SIP OF WATER: -GABAPENTIN (NEURONTIN) -CYMBALTA (DULOXETINE)   One week prior to surgery: Stop Anti-inflammatories (NSAIDS) such as Advil, Aleve, Ibuprofen, Motrin, Naproxen, Naprosyn and Aspirin based products such as Excedrin, Goodys Powder, BC Powder-OK TO TAKE TYLENOL IF NEEDED  Stop ANY OVER THE COUNTER supplements/vitamins until after surgery.  No Alcohol for 24 hours before or after surgery.  No Smoking including e-cigarettes for 24 hours prior to surgery.  No chewable tobacco products for at least 6 hours prior to surgery.  No nicotine patches on the day of surgery.  Do not use any "recreational" drugs for at least a week prior to your surgery.  Please be advised that the combination of cocaine and anesthesia may have negative outcomes, up to and including death. If you test positive for cocaine, your surgery will be cancelled.  On the morning of surgery brush your teeth with toothpaste and water, you may rinse your mouth with mouthwash  if you wish. Do not swallow any toothpaste or mouthwash.  Do not wear jewelry, make-up, hairpins, clips or nail polish.  Do not wear lotions, powders, or perfumes.   Do not shave body from the neck down 48 hours prior to surgery just in case you cut yourself which could leave a site for infection.  Also, freshly shaved skin may become irritated if using the CHG soap.  Contact lenses, hearing aids and dentures may not be worn into surgery.  Do not bring valuables to the hospital. [facility] is not responsible for any missing/lost belongings or valuables.   Use CHG Soap as directed on instruction sheet.  Notify your doctor if there is any change in your medical condition (cold, fever, infection).  Wear comfortable clothing (specific to your surgery type) to the hospital.  Plan for stool softeners for home use; pain medications have a tendency to cause constipation. You can also help prevent constipation by eating foods high in fiber such as fruits and vegetables and drinking plenty of fluids as your diet allows.  After surgery, you can help prevent lung complications by doing breathing exercises.  Take deep breaths and cough every 1-2 hours. Your doctor may order a device called an Incentive Spirometer to help you take deep breaths. When coughing or sneezing, hold a pillow firmly against your incision with both hands. This is called "splinting." Doing this helps protect your incision. It also decreases belly discomfort.  If you are being admitted to the hospital overnight, leave your suitcase in the car. After surgery  it may be brought to your room.  If you are being discharged the day of surgery, you will not be allowed to drive home. You will need a responsible adult (18 years or older) to drive you home and stay with you that night.   If you are taking public transportation, you will need to have a responsible adult (18 years or older) with you. Please confirm with your physician  that it is acceptable to use public transportation.   Please call the Pre-admissions Testing Dept. at [phone] if you have any questions about these instructions.  Surgery Visitation Policy:  Patients undergoing a surgery or procedure may have one family member or support person with them as long as that person is not COVID-19 positive or experiencing its symptoms.  That person may remain in the waiting area during the procedure.  Inpatient Visitation:    Visiting hours are 7 a.m. to 8 p.m. Inpatients will be allowed two visitors daily. The visitors may change each day during the patient's stay. No visitors under the age of 64. Any visitor under the age of 38 must be accompanied by an adult. The visitor must pass COVID-19 screenings, use hand sanitizer when entering and exiting the patient's room and wear a mask at all times, including in the patient's room. Patients must also wear a mask when staff or their visitor are in the room. Masking is required regardless of vaccination status.

## 2021-02-05 ENCOUNTER — Other Ambulatory Visit
Admission: RE | Admit: 2021-02-05 | Discharge: 2021-02-05 | Disposition: A | Discharge status: Home / Self Care | Payer: Medicaid Other | Source: Ambulatory Visit | Attending: Neurosurgery | Admitting: Neurosurgery

## 2021-02-05 DIAGNOSIS — Z01812 Encounter for preprocedural laboratory examination: Secondary | ICD-10-CM | POA: Insufficient documentation

## 2021-02-05 DIAGNOSIS — Z20822 Contact with and (suspected) exposure to covid-19: Secondary | ICD-10-CM | POA: Insufficient documentation

## 2021-02-05 LAB — SARS CORONAVIRUS 2 (TAT 6-24 HRS): SARS Coronavirus 2: NEGATIVE

## 2021-02-06 DIAGNOSIS — M5412 Radiculopathy, cervical region: Secondary | ICD-10-CM | POA: Diagnosis not present

## 2021-02-09 ENCOUNTER — Observation Stay
Admission: RE | Admit: 2021-02-09 | Discharge: 2021-02-10 | Disposition: A | Discharge status: Home / Self Care | Payer: Medicaid Other | Attending: Neurosurgery | Admitting: Neurosurgery

## 2021-02-09 ENCOUNTER — Ambulatory Visit: Payer: Medicaid Other | Admitting: Urgent Care

## 2021-02-09 ENCOUNTER — Encounter: Payer: Self-pay | Admitting: Neurosurgery

## 2021-02-09 ENCOUNTER — Ambulatory Visit: Payer: Medicaid Other | Admitting: Certified Registered"

## 2021-02-09 ENCOUNTER — Ambulatory Visit: Payer: Medicaid Other

## 2021-02-09 ENCOUNTER — Other Ambulatory Visit: Payer: Self-pay

## 2021-02-09 ENCOUNTER — Encounter
Admission: RE | Disposition: A | Discharge status: Home / Self Care | Payer: Self-pay | Source: Home / Self Care | Attending: Neurosurgery

## 2021-02-09 DIAGNOSIS — M5412 Radiculopathy, cervical region: Principal | ICD-10-CM | POA: Diagnosis present

## 2021-02-09 DIAGNOSIS — Z7982 Long term (current) use of aspirin: Secondary | ICD-10-CM | POA: Insufficient documentation

## 2021-02-09 DIAGNOSIS — Z91010 Allergy to peanuts: Secondary | ICD-10-CM | POA: Diagnosis not present

## 2021-02-09 DIAGNOSIS — M4322 Fusion of spine, cervical region: Secondary | ICD-10-CM | POA: Diagnosis not present

## 2021-02-09 DIAGNOSIS — G959 Disease of spinal cord, unspecified: Secondary | ICD-10-CM | POA: Diagnosis not present

## 2021-02-09 DIAGNOSIS — F17210 Nicotine dependence, cigarettes, uncomplicated: Secondary | ICD-10-CM | POA: Insufficient documentation

## 2021-02-09 DIAGNOSIS — Z419 Encounter for procedure for purposes other than remedying health state, unspecified: Secondary | ICD-10-CM

## 2021-02-09 DIAGNOSIS — M79601 Pain in right arm: Secondary | ICD-10-CM | POA: Diagnosis present

## 2021-02-09 DIAGNOSIS — Z981 Arthrodesis status: Secondary | ICD-10-CM | POA: Diagnosis not present

## 2021-02-09 HISTORY — PX: ANTERIOR CERVICAL DECOMP/DISCECTOMY FUSION: SHX1161

## 2021-02-09 LAB — URINE DRUG SCREEN, QUALITATIVE (ARMC ONLY)
Amphetamines, Ur Screen: NOT DETECTED
Barbiturates, Ur Screen: NOT DETECTED
Benzodiazepine, Ur Scrn: NOT DETECTED
Cannabinoid 50 Ng, Ur ~~LOC~~: POSITIVE — AB
Cocaine Metabolite,Ur ~~LOC~~: NOT DETECTED
MDMA (Ecstasy)Ur Screen: NOT DETECTED
Methadone Scn, Ur: NOT DETECTED
Opiate, Ur Screen: NOT DETECTED
Phencyclidine (PCP) Ur S: NOT DETECTED
Tricyclic, Ur Screen: NOT DETECTED

## 2021-02-09 LAB — POCT PREGNANCY, URINE: Preg Test, Ur: NEGATIVE

## 2021-02-09 SURGERY — ANTERIOR CERVICAL DECOMPRESSION/DISCECTOMY FUSION 2 LEVELS
Anesthesia: General

## 2021-02-09 MED ORDER — ONDANSETRON HCL 4 MG/2ML IJ SOLN: 4.0000 mg | Freq: Four times a day (QID) | INTRAMUSCULAR | Status: DC | PRN

## 2021-02-09 MED ORDER — MIDAZOLAM HCL 2 MG/2ML IJ SOLN
INTRAMUSCULAR | Status: DC | PRN
  Administered 2021-02-09: 2 mg via INTRAVENOUS

## 2021-02-09 MED ORDER — SODIUM CHLORIDE 0.9% FLUSH: 3.0000 mL | INTRAVENOUS | Status: DC | PRN

## 2021-02-09 MED ORDER — OXYCODONE HCL 5 MG PO TABS: 5.0000 mg | ORAL_TABLET | ORAL | Status: DC | PRN

## 2021-02-09 MED ORDER — LIDOCAINE-EPINEPHRINE 1 %-1:100000 IJ SOLN
INTRAMUSCULAR | Status: DC | PRN
  Administered 2021-02-09: 4 mL

## 2021-02-09 MED ORDER — OXYCODONE HCL 5 MG PO TABS
10.0000 mg | ORAL_TABLET | ORAL | Status: DC | PRN
Start: 2021-02-09 — End: 2021-02-10
  Administered 2021-02-09 – 2021-02-10 (×4): 10 mg via ORAL
  Filled 2021-02-09 (×5): qty 2

## 2021-02-09 MED ORDER — THROMBIN 5000 UNITS EX SOLR
CUTANEOUS | Status: DC | PRN
  Administered 2021-02-09: 5000 [IU] via TOPICAL

## 2021-02-09 MED ORDER — REMIFENTANIL HCL 1 MG IV SOLR
INTRAVENOUS | Status: DC | PRN
  Administered 2021-02-09: .05 ug/kg/min via INTRAVENOUS

## 2021-02-09 MED ORDER — CEFAZOLIN SODIUM-DEXTROSE 2-4 GM/100ML-% IV SOLN
INTRAVENOUS | Status: AC
  Filled 2021-02-09: qty 100

## 2021-02-09 MED ORDER — LACTATED RINGERS IV SOLN: INTRAVENOUS | Status: DC

## 2021-02-09 MED ORDER — CEFAZOLIN SODIUM-DEXTROSE 2-4 GM/100ML-% IV SOLN
2.0000 g | INTRAVENOUS | Status: AC
  Administered 2021-02-09: 2 g via INTRAVENOUS

## 2021-02-09 MED ORDER — SODIUM CHLORIDE 0.9 % IV SOLN
250.0000 mL | INTRAVENOUS | Status: DC
  Administered 2021-02-09: 250 mL via INTRAVENOUS

## 2021-02-09 MED ORDER — FENTANYL CITRATE (PF) 250 MCG/5ML IJ SOLN
INTRAMUSCULAR | Status: AC
  Filled 2021-02-09: qty 5

## 2021-02-09 MED ORDER — FAMOTIDINE 20 MG PO TABS
ORAL_TABLET | ORAL | Status: AC
  Filled 2021-02-09: qty 1

## 2021-02-09 MED ORDER — PROPOFOL 10 MG/ML IV BOLUS
INTRAVENOUS | Status: DC | PRN
  Administered 2021-02-09: 150 mg via INTRAVENOUS
  Administered 2021-02-09: 165 ug/kg/min via INTRAVENOUS

## 2021-02-09 MED ORDER — ORPHENADRINE CITRATE ER 100 MG PO TB12: 100.0000 mg | ORAL_TABLET | Freq: Two times a day (BID) | ORAL | Status: DC

## 2021-02-09 MED ORDER — OXYCODONE HCL 5 MG/5ML PO SOLN: 5.0000 mg | Freq: Once | ORAL | Status: DC | PRN

## 2021-02-09 MED ORDER — ONDANSETRON HCL 4 MG/2ML IJ SOLN
INTRAMUSCULAR | Status: DC | PRN
  Administered 2021-02-09: 4 mg via INTRAVENOUS

## 2021-02-09 MED ORDER — SENNA 8.6 MG PO TABS
1.0000 | ORAL_TABLET | Freq: Two times a day (BID) | ORAL | Status: DC
  Administered 2021-02-09 (×2): 8.6 mg via ORAL
  Filled 2021-02-09 (×2): qty 1

## 2021-02-09 MED ORDER — DEXAMETHASONE SODIUM PHOSPHATE 10 MG/ML IJ SOLN
INTRAMUSCULAR | Status: DC | PRN
  Administered 2021-02-09: 10 mg via INTRAVENOUS

## 2021-02-09 MED ORDER — PROPOFOL 1000 MG/100ML IV EMUL
INTRAVENOUS | Status: AC
  Filled 2021-02-09: qty 100

## 2021-02-09 MED ORDER — MIDAZOLAM HCL 2 MG/2ML IJ SOLN
INTRAMUSCULAR | Status: AC
  Filled 2021-02-09: qty 2

## 2021-02-09 MED ORDER — FENTANYL CITRATE (PF) 100 MCG/2ML IJ SOLN
INTRAMUSCULAR | Status: AC
  Administered 2021-02-09: 50 ug via INTRAVENOUS
  Filled 2021-02-09: qty 2

## 2021-02-09 MED ORDER — CHLORHEXIDINE GLUCONATE 0.12 % MT SOLN
OROMUCOSAL | Status: AC
  Filled 2021-02-09: qty 15

## 2021-02-09 MED ORDER — PHENOL 1.4 % MT LIQD
1.0000 | OROMUCOSAL | Status: DC | PRN
  Filled 2021-02-09: qty 177

## 2021-02-09 MED ORDER — LABETALOL HCL 5 MG/ML IV SOLN
5.0000 mg | Freq: Once | INTRAVENOUS | Status: AC
  Administered 2021-02-09: 5 mg via INTRAVENOUS

## 2021-02-09 MED ORDER — FAMOTIDINE 20 MG PO TABS
20.0000 mg | ORAL_TABLET | Freq: Once | ORAL | Status: AC
  Administered 2021-02-09: 20 mg via ORAL

## 2021-02-09 MED ORDER — PHENYLEPHRINE HCL (PRESSORS) 10 MG/ML IV SOLN
INTRAVENOUS | Status: AC
  Filled 2021-02-09: qty 1

## 2021-02-09 MED ORDER — SODIUM CHLORIDE 0.9% FLUSH
3.0000 mL | Freq: Two times a day (BID) | INTRAVENOUS | Status: DC
  Administered 2021-02-09 – 2021-02-10 (×2): 3 mL via INTRAVENOUS

## 2021-02-09 MED ORDER — LABETALOL HCL 5 MG/ML IV SOLN
INTRAVENOUS | Status: AC
  Filled 2021-02-09: qty 4

## 2021-02-09 MED ORDER — SUCCINYLCHOLINE CHLORIDE 200 MG/10ML IV SOSY
PREFILLED_SYRINGE | INTRAVENOUS | Status: AC
  Filled 2021-02-09: qty 10

## 2021-02-09 MED ORDER — DULOXETINE HCL 30 MG PO CPEP
30.0000 mg | ORAL_CAPSULE | ORAL | Status: DC
  Administered 2021-02-09: 30 mg via ORAL
  Filled 2021-02-09 (×3): qty 1

## 2021-02-09 MED ORDER — MEPERIDINE HCL 25 MG/ML IJ SOLN: 6.2500 mg | INTRAMUSCULAR | Status: DC | PRN

## 2021-02-09 MED ORDER — CEFAZOLIN SODIUM-DEXTROSE 2-4 GM/100ML-% IV SOLN
2.0000 g | Freq: Three times a day (TID) | INTRAVENOUS | Status: AC
  Administered 2021-02-09 – 2021-02-10 (×2): 2 g via INTRAVENOUS
  Filled 2021-02-09 (×3): qty 100

## 2021-02-09 MED ORDER — LIDOCAINE HCL (CARDIAC) PF 100 MG/5ML IV SOSY
PREFILLED_SYRINGE | INTRAVENOUS | Status: DC | PRN
  Administered 2021-02-09: 80 mg via INTRAVENOUS

## 2021-02-09 MED ORDER — ORAL CARE MOUTH RINSE: 15.0000 mL | Freq: Once | OROMUCOSAL | Status: AC

## 2021-02-09 MED ORDER — FENTANYL CITRATE (PF) 100 MCG/2ML IJ SOLN
25.0000 ug | INTRAMUSCULAR | Status: DC | PRN
  Administered 2021-02-09: 50 ug via INTRAVENOUS

## 2021-02-09 MED ORDER — SUCCINYLCHOLINE CHLORIDE 20 MG/ML IJ SOLN
INTRAMUSCULAR | Status: DC | PRN
  Administered 2021-02-09: 100 mg via INTRAVENOUS

## 2021-02-09 MED ORDER — FENTANYL CITRATE (PF) 100 MCG/2ML IJ SOLN
INTRAMUSCULAR | Status: DC | PRN
  Administered 2021-02-09: 50 ug via INTRAVENOUS
  Administered 2021-02-09: 100 ug via INTRAVENOUS

## 2021-02-09 MED ORDER — PROPOFOL 10 MG/ML IV BOLUS
INTRAVENOUS | Status: AC
  Filled 2021-02-09: qty 20

## 2021-02-09 MED ORDER — GABAPENTIN 300 MG PO CAPS
300.0000 mg | ORAL_CAPSULE | Freq: Three times a day (TID) | ORAL | Status: DC
  Administered 2021-02-09 (×2): 300 mg via ORAL
  Filled 2021-02-09 (×2): qty 1

## 2021-02-09 MED ORDER — PROMETHAZINE HCL 25 MG/ML IJ SOLN: 6.2500 mg | INTRAMUSCULAR | Status: DC | PRN

## 2021-02-09 MED ORDER — REMIFENTANIL HCL 1 MG IV SOLR
INTRAVENOUS | Status: AC
  Filled 2021-02-09: qty 1000

## 2021-02-09 MED ORDER — ONDANSETRON HCL 4 MG PO TABS: 4.0000 mg | ORAL_TABLET | Freq: Four times a day (QID) | ORAL | Status: DC | PRN

## 2021-02-09 MED ORDER — METHOCARBAMOL 500 MG PO TABS
500.0000 mg | ORAL_TABLET | Freq: Four times a day (QID) | ORAL | Status: DC | PRN
  Administered 2021-02-09: 500 mg via ORAL
  Filled 2021-02-09 (×2): qty 1

## 2021-02-09 MED ORDER — CHLORHEXIDINE GLUCONATE 0.12 % MT SOLN
15.0000 mL | Freq: Once | OROMUCOSAL | Status: AC
  Administered 2021-02-09: 15 mL via OROMUCOSAL

## 2021-02-09 MED ORDER — MORPHINE SULFATE (PF) 2 MG/ML IV SOLN: 2.0000 mg | INTRAVENOUS | Status: DC | PRN

## 2021-02-09 MED ORDER — MENTHOL 3 MG MT LOZG
1.0000 | LOZENGE | OROMUCOSAL | Status: DC | PRN
  Filled 2021-02-09: qty 9

## 2021-02-09 MED ORDER — LIDOCAINE HCL (PF) 2 % IJ SOLN
INTRAMUSCULAR | Status: AC
  Filled 2021-02-09: qty 5

## 2021-02-09 MED ORDER — LIDOCAINE 5 % EX PTCH
1.0000 | MEDICATED_PATCH | Freq: Two times a day (BID) | CUTANEOUS | Status: DC
  Administered 2021-02-09 – 2021-02-10 (×2): 1 via TRANSDERMAL
  Filled 2021-02-09 (×3): qty 1

## 2021-02-09 MED ORDER — OXYCODONE HCL 5 MG PO TABS: 5.0000 mg | ORAL_TABLET | Freq: Once | ORAL | Status: DC | PRN

## 2021-02-09 SURGICAL SUPPLY — 64 items
BIT DRILL 13 (BIT) ×2 IMPLANT
BLADE BOVIE TIP EXT 4 (BLADE) ×2 IMPLANT
BLADE SURG 15 STRL LF DISP TIS (BLADE) ×1 IMPLANT
BLADE SURG 15 STRL SS (BLADE) ×1
BONE WEDGE CONERSTONE 6X14X11 (Bone Implant) ×4 IMPLANT
BUR DIAMOND COARSE 4.0 RND (BURR) ×2 IMPLANT
BUR NEURO DRILL SOFT 3.0X3.8M (BURR) ×2 IMPLANT
CANISTER SUCT 1200ML W/VALVE (MISCELLANEOUS) IMPLANT
CHLORAPREP W/TINT 26 (MISCELLANEOUS) ×4 IMPLANT
COUNTER NEEDLE 20/40 LG (NEEDLE) ×2 IMPLANT
COVER LIGHT HANDLE STERIS (MISCELLANEOUS) ×4 IMPLANT
COVER WAND RF STERILE (DRAPES) ×2 IMPLANT
CUP MEDICINE 2OZ PLAST GRAD ST (MISCELLANEOUS) ×4 IMPLANT
DERMABOND ADVANCED (GAUZE/BANDAGES/DRESSINGS) ×1
DERMABOND ADVANCED .7 DNX12 (GAUZE/BANDAGES/DRESSINGS) ×1 IMPLANT
DRAPE C-ARM 42X72 X-RAY (DRAPES) ×4 IMPLANT
DRAPE MICROSCOPE SPINE 48X150 (DRAPES) ×2 IMPLANT
DRAPE SURG 17X11 SM STRL (DRAPES) ×4 IMPLANT
DRAPE THYROID T SHEET (DRAPES) ×2 IMPLANT
DRSG OPSITE POSTOP 4X6 (GAUZE/BANDAGES/DRESSINGS) ×2 IMPLANT
ELECT CAUTERY BLADE TIP 2.5 (TIP) ×2
ELECT EZSTD 165MM 6.5IN (MISCELLANEOUS) ×2
ELECTRODE CAUTERY BLDE TIP 2.5 (TIP) ×1 IMPLANT
ELECTRODE EZSTD 165MM 6.5IN (MISCELLANEOUS) ×1 IMPLANT
FEE INTRAOP CADWELL SUPPLY NCS (MISCELLANEOUS) ×1 IMPLANT
FEE INTRAOP MONITOR IMPULS NCS (MISCELLANEOUS) ×1 IMPLANT
GAUZE SPONGE 4X4 12PLY STRL (GAUZE/BANDAGES/DRESSINGS) ×2 IMPLANT
GLOVE SRG 8 PF TXTR STRL LF DI (GLOVE) ×1 IMPLANT
GLOVE SURG SYN 7.0 (GLOVE) IMPLANT
GLOVE SURG SYN 8.0 (GLOVE) ×4 IMPLANT
GLOVE SURG UNDER LTX SZ7 (GLOVE) IMPLANT
GLOVE SURG UNDER POLY LF SZ8 (GLOVE) ×1
GOWN STRL REUS W/ TWL XL LVL3 (GOWN DISPOSABLE) ×2 IMPLANT
GOWN STRL REUS W/TWL XL LVL3 (GOWN DISPOSABLE) ×2
GRADUATE 1200CC STRL 31836 (MISCELLANEOUS) ×2 IMPLANT
INTRAOP CADWELL SUPPLY FEE NCS (MISCELLANEOUS) ×1
INTRAOP DISP SUPPLY FEE NCS (MISCELLANEOUS) ×1
INTRAOP MONITOR FEE IMPULS NCS (MISCELLANEOUS) ×1
INTRAOP MONITOR FEE IMPULSE (MISCELLANEOUS) ×1
IV CATH ANGIO 12GX3 LT BLUE (NEEDLE) ×2 IMPLANT
KIT TURNOVER KIT A (KITS) ×2 IMPLANT
MANIFOLD NEPTUNE II (INSTRUMENTS) ×2 IMPLANT
MARKER SKIN DUAL TIP RULER LAB (MISCELLANEOUS) ×4 IMPLANT
NEEDLE HYPO 22GX1.5 SAFETY (NEEDLE) ×2 IMPLANT
NEEDLE SPNL 22GX3.5 QUINCKE BK (NEEDLE) ×2 IMPLANT
NS IRRIG 1000ML POUR BTL (IV SOLUTION) ×2 IMPLANT
PACK LAMINECTOMY NEURO (CUSTOM PROCEDURE TRAY) ×2 IMPLANT
PAD ARMBOARD 7.5X6 YLW CONV (MISCELLANEOUS) ×4 IMPLANT
PIN CASPAR 14 (PIN) ×1 IMPLANT
PIN CASPAR 14MM (PIN) ×2
PLATE ZEVO 2LVL 35MM (Plate) ×2 IMPLANT
PUTTY DBF 1CC CORTICAL FIBERS (Putty) ×2 IMPLANT
SCREW 13MM (Screw) ×2 IMPLANT
SCREW 3.5 SELFDRILL 15MM VARI (Screw) ×14 IMPLANT
SPOGE SURGIFLO 8M (HEMOSTASIS) ×1
SPONGE KITTNER 5P (MISCELLANEOUS) ×2 IMPLANT
SPONGE SURGIFLO 8M (HEMOSTASIS) ×1 IMPLANT
SUT POLYSORB 2-0 5X18 GS-10 (SUTURE) ×4 IMPLANT
SUT VICRYL 3-0 CR8 SH (SUTURE) ×2 IMPLANT
SYR 30ML LL (SYRINGE) ×2 IMPLANT
TAPE CLOTH 3X10 WHT NS LF (GAUZE/BANDAGES/DRESSINGS) ×2 IMPLANT
TOWEL OR 17X26 4PK STRL BLUE (TOWEL DISPOSABLE) ×4 IMPLANT
TRAY FOLEY MTR SLVR 16FR STAT (SET/KITS/TRAYS/PACK) IMPLANT
TUBING CONNECTING 10 (TUBING) ×2 IMPLANT

## 2021-02-09 NOTE — Anesthesia Procedure Notes (Addendum)
Arterial Line Insertion Start/End5/16/2022 7:25 AM, 02/09/2021 7:35 AM Performed by: Alver Fisher, MD, anesthesiologist  Preanesthetic checklist: patient identified, IV checked, site marked, risks and benefits discussed, surgical consent, monitors and equipment checked, pre-op evaluation, timeout performed and anesthesia consent Left, radial was placed Catheter size: 20 G Hand hygiene performed  and Seldinger technique used Allen's test indicative of satisfactory collateral circulation Attempts: 2 Procedure performed without using ultrasound guided technique. Following insertion, Biopatch and dressing applied. Post procedure assessment: normal  Post procedure complications: local hematoma. Patient tolerated the procedure well with no immediate complications.

## 2021-02-09 NOTE — H&P (Signed)
Dominique Shea is an 30 y.o. female.   Chief Complaint: Right arm pain and numbness HPI: Ms. Dominique Shea is here as follow-up from the emergency department for ongoing symptoms of right arm pain and numbness. She additionally says she does have chronic neck pain which is more eccentric to the right but the most bothersome part is the right arm. The pain goes down into the hand on the more lateral side. There is numbness on that side as well as into the forearm and lateral arm. She does not endorse any left arm symptoms. She does feel like these right arm symptoms have been worsening and she has had multiple trips to the emergency department where a MRI was performed and showed compression in the mid cervical region. She is here for C4-6 ACDF   Past Medical History:  Diagnosis Date  . 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 Anemia   . Anxiety   . 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 History of gonorrhea 2014  . Migraine   . Thrombocytopenia (HCC)    DURING FIRST PREG    Past Surgical History:  Procedure Laterality Date  . CHOLECYSTECTOMY  2012   [facility]  . ECTOPIC PREGNANCY SURGERY    . FRACTURE SURGERY     RIGHT MIDDLE FINGER    Family History  Problem Relation Age of Onset  . Hypertension Mother   . Migraines Mother   . Hypertension Father   . Migraines Father   . Migraines Sister   . Migraines Brother    Social History:  reports that she has been smoking cigarettes. She has a 4.00 pack-year smoking history. She has never used smokeless tobacco. She reports current alcohol use. She reports current drug use. Drug: Marijuana.  Allergies:  Allergies  Allergen Reactions  . Peanut-Containing Drug Products Anaphylaxis  . Tomato Anaphylaxis  . Tylenol [Acetaminophen] Hives  . Doxycycline Swelling    Pt had facial swelling after receiving ms04, zofran, hydrocodone, tylenol, ivp dye, and doxycline. Unclear which was responsible agent.   .  Ibuprofen Swelling  . Ivp Dye [Iodinated Diagnostic Agents] Swelling    Pt had facial swelling after receiving ms04, zofran, hydrocodone, tylenol, ivp dye, and doxycline. Unclear which was responsible agent.     Medications Prior to Admission  Medication Sig Dispense Refill  . Aspirin-Acetaminophen-Caffeine (EXCEDRIN PO) Take 1 tablet by mouth as needed.    . DULoxetine (CYMBALTA) 30 MG capsule Take 30 mg by mouth every morning.    . gabapentin (NEURONTIN) 300 MG capsule Take 1 capsule by mouth 3 (three) times daily.    Marland Kitchen lidocaine (LIDODERM) 5 % Place 1 patch onto the skin every 12 (twelve) hours. Remove & Discard patch within 12 hours or as directed by MD 10 patch 0  . methocarbamol (ROBAXIN) 500 MG tablet Take by mouth.    . methylPREDNISolone (MEDROL DOSEPAK) 4 MG TBPK tablet 1 pak 21 tablet 0  . orphenadrine (NORFLEX) 100 MG tablet Take 1 tablet (100 mg total) by mouth 2 (two) times daily. 10 tablet 0  . oxyCODONE (OXY IR/ROXICODONE) 5 MG immediate release tablet Take 5 mg by mouth every 4 (four) hours as needed.    Marland Kitchen oxyCODONE (ROXICODONE) 5 MG immediate release tablet Take 1 tablet (5 mg total) by mouth every 8 (eight) hours as needed for up to 8 doses for severe pain. 8 tablet 0    Results for orders placed or performed during  the hospital encounter of 02/09/21 (from the past 48 hour(s))  Urine Drug Screen, Qualitative ([facility] only)     Status: Abnormal   Collection Time: 02/09/21  6:08 AM  Result Value Ref Range   Tricyclic, Ur Screen NONE DETECTED NONE DETECTED   Amphetamines, Ur Screen NONE DETECTED NONE DETECTED   MDMA (Ecstasy)Ur Screen NONE DETECTED NONE DETECTED   Cocaine Metabolite,Ur [place] NONE DETECTED NONE DETECTED   Opiate, Ur Screen NONE DETECTED NONE DETECTED   Phencyclidine (PCP) Ur S NONE DETECTED NONE DETECTED   Cannabinoid 50 Ng, Ur [place] POSITIVE (A) NONE DETECTED   Barbiturates, Ur Screen NONE DETECTED NONE DETECTED   Benzodiazepine, Ur Scrn NONE DETECTED NONE  DETECTED   Methadone Scn, Ur NONE DETECTED NONE DETECTED    Comment: (NOTE) Tricyclics + metabolites, urine    Cutoff 1000 ng/mL Amphetamines + metabolites, urine  Cutoff 1000 ng/mL MDMA (Ecstasy), urine              Cutoff 500 ng/mL Cocaine Metabolite, urine          Cutoff 300 ng/mL Opiate + metabolites, urine        Cutoff 300 ng/mL Phencyclidine (PCP), urine         Cutoff 25 ng/mL Cannabinoid, urine                 Cutoff 50 ng/mL Barbiturates + metabolites, urine  Cutoff 200 ng/mL Benzodiazepine, urine              Cutoff 200 ng/mL Methadone, urine                   Cutoff 300 ng/mL  The urine drug screen provides only a preliminary, unconfirmed analytical test result and should not be used for non-medical purposes. Clinical consideration and professional judgment should be applied to any positive drug screen result due to possible interfering substances. A more specific alternate chemical method must be used in order to obtain a confirmed analytical result. Gas chromatography / mass spectrometry (GC/MS) is the preferred confirm atory method. Performed at [facility], [address] [place], [place] [address]    No results found.  Review of Systems General ROS: Negative Psychological ROS: Negative Ophthalmic ROS: Negative ENT ROS: Negative Hematological and Lymphatic ROS: Negative  Endocrine ROS: Negative Respiratory ROS: Negative Cardiovascular ROS: Negative Gastrointestinal ROS: Negative Genito-Urinary ROS: Negative Musculoskeletal ROS: Negative Neurological ROS: Positive for right arm pain, numbness Dermatological ROS: Negative  Blood pressure (!) 132/98, pulse (!) 57, temperature 98 F (36.7 C), temperature source Oral, resp. rate 16, height 5\' 4"  (1.626 m), weight 59.6 kg, last menstrual period 01/17/2021, SpO2 99 %. Physical Exam  General appearance: Alert, cooperative, in no acute distress Head: Normocephalic, atraumatic Eyes: Normal, EOM  intact Oropharynx: Wearing facemask CV: regular rate and rhythm Pulm: Clear to auscultation Neck: Supple, range of motion appears full Ext: No edema in extremities, warm to touch  Neurologic exam:  Mental status: alertness: alert, affect: normal Speech: fluent and clear Motor:strength symmetric 5/5 in bilateral deltoid, bicep, tricep, left grip. She is 4+ out of 5 in right grip. She is 5-5 in bilateral lower extremities Sensory: Decreased light sensation over the right arm from the mid proximal arm to the lateral hand Gait: normal  Imaging Results for orders placed or performed during the hospital encounter of 01/23/21  MRI cervical spine without contrast  Narrative  MRI CERVICAL SPINE WITHOUT CONTRAST  INDICATION: worsening neck pain, numbness in median distrubution, D69.6  Thrombocytopenia, unspecified (CMS-HCC)  COMPARISON: MRI 01/13/2021  TECHNIQUE/PROTOCOL: Standard cervical spine protocol MR performed without contrast  FINDINGS: Alignment: Reversal of the cervical lordosis with focal kyphosis centered at C5-6. No listhesis.  Bone marrow Signal: no suspicious lesions Spinal cord: No compression. Normal in size and signal.  Paraspinal Soft Tissues: Unremarkable.  Occiput-C1: no significant degenerative change [place]-dental interval: normal C1-2 lateral masses: no significant degenerative change  C2-C3: no significant stenosis C3-C4: no significant stenosis C4-C5: Circumferential disc bulge eccentric to the right. This contacts and minimally flattens the ventral spinal cord. Overall mild spinal canal stenosis. No significant neuroforaminal stenosis. C5-C6: Disc bulge with right central/foraminal extrusion which contacts and flattens the right ventral aspect of the spinal cord causing moderate spinal canal stenosis and severe right and mild left neural foraminal narrowing. This appears slightly larger compared with outside MRI from 01/13/2021. C6-C7: no significant  stenosis C7-T1: no significant stenosis  IMPRESSION: 1. Focal right central/foraminal disc extrusion at C5-6 which contacts and mildly flattens the right ventral aspect spinal cord. This causes severe right neuroforaminal stenosis and overall moderate spinal canal stenosis. This disc extrusion appears slightly larger compared with 01/13/2021.  2. Small circumferential disc bulge at C4-5 with mild overall spinal canal stenosis.  Electronically Reviewed by: Perry Mount, MD, [facility] Electronically Reviewed on: 01/23/2021 4:55 PM  I have reviewed the images and concur with the above findings.  Electronically Signed by: Darcella Gasman, MD, [facility] Electronically Signed on: 01/23/2021 5:19 PM    Assessment/Plan Proceed with C4-6 ACDF  Lucy Chris, MD 02/09/2021, 6:49 AM

## 2021-02-09 NOTE — Plan of Care (Signed)
Post op

## 2021-02-09 NOTE — Anesthesia Postprocedure Evaluation (Signed)
Anesthesia Post Note  Patient: Dominique Shea  Procedure(s) Performed: C4/5, C5/6 ANTERIOR CERVICAL DECOMPRESSION/DISCECTOMY FUSION (N/A )  Patient location during evaluation: PACU Anesthesia Type: General Level of consciousness: awake and alert and oriented Pain management: pain level controlled Vital Signs Assessment: post-procedure vital signs reviewed and stable Respiratory status: spontaneous breathing, nonlabored ventilation and respiratory function stable Cardiovascular status: blood pressure returned to baseline and stable Postop Assessment: no signs of nausea or vomiting Anesthetic complications: no   No complications documented.   Last Vitals:  Vitals:   02/09/21 1145 02/09/21 1159  BP: (!) 140/102 (!) 135/95  Pulse: 70 62  Resp: 16 15  Temp:  36.8 C  SpO2: 100% 100%    Last Pain:  Vitals:   02/09/21 1212  TempSrc:   PainSc: 7                  Joscelynn Brutus

## 2021-02-09 NOTE — Anesthesia Preprocedure Evaluation (Signed)
Anesthesia Evaluation  Patient identified by MRN, date of birth, ID band Patient awake    Reviewed: Allergy & Precautions, NPO status , Patient's Chart, lab work & pertinent test results  History of Anesthesia Complications Negative for: history of anesthetic complications  Airway Mallampati: II  TM Distance: >3 FB Neck ROM: Full    Dental no notable dental hx.    Pulmonary neg sleep apnea, neg COPD, Current Smoker and Patient abstained from smoking.,    breath sounds clear to auscultation- rhonchi (-) wheezing      Cardiovascular Exercise Tolerance: Good (-) hypertension(-) angina(-) CAD, (-) Past MI and (-) Cardiac Stents negative cardio ROS   Rhythm:Regular Rate:Normal - Systolic murmurs and - Diastolic murmurs    Neuro/Psych  Headaches, neg Seizures PSYCHIATRIC DISORDERS Anxiety    GI/Hepatic negative GI ROS, Neg liver ROS,   Endo/Other  negative endocrine ROSneg diabetes  Renal/GU negative Renal ROS     Musculoskeletal negative musculoskeletal ROS (+)   Abdominal (+) - obese,   Peds  Hematology  (+) anemia ,   Anesthesia Other Findings Past Medical History: 2014: Abnormal Pap smear of cervix     Comment:  HPV + No date: Anemia No date: Anxiety No date: HELLP (hemolytic anemia/elev liver enzymes/low platelets in  pregnancy) 2014: History of gonorrhea No date: Migraine No date: Thrombocytopenia (HCC)     Comment:  DURING FIRST PREG   Reproductive/Obstetrics                             Anesthesia Physical Anesthesia Plan  ASA: II  Anesthesia Plan: General   Post-op Pain Management:    Induction: Intravenous  PONV Risk Score and Plan: 1 and Ondansetron and TIVA  Airway Management Planned: Oral ETT  Additional Equipment: Arterial line  Intra-op Plan:   Post-operative Plan: Extubation in OR  Informed Consent: I have reviewed the patients History and Physical, chart,  labs and discussed the procedure including the risks, benefits and alternatives for the proposed anesthesia with the patient or authorized representative who has indicated his/her understanding and acceptance.     Dental advisory given  Plan Discussed with: CRNA and Anesthesiologist  Anesthesia Plan Comments: (Arterial line requested by surgeon)        Anesthesia Quick Evaluation

## 2021-02-09 NOTE — Op Note (Signed)
Operative Note   SURGERY DATE:  02/09/2021   PRE-OP DIAGNOSIS:  Cervical Radiculopathy   POST-OP DIAGNOSIS: Post-Op Diagnosis Codes:  Cervical radiculopathy   Procedure(s) with comments: C4/5, C5/6 Anterior Cervical Discectomy and Fusion  SURGEON:     * Nathaniel Man, MD       Anabel Halon, PA Assistant   ANESTHESIA: General    OPERATIVE FINDINGS: Disc herniation at C4/5, C5/6   Procedure Indications Dominique Shea presented to our clinic on 5/10 with righta rm numbness and weakness inhibiiting ability to perform daily activities. She failed conservative management including medications and steroids. She had a MRI that showed disc herniations causing stenosis and spinal cord compression on the at C4/5 and C5/6. Given the weakness and numbness, we recommended an anterior cervical decompression and fusion to relieve the pressure on the nerves. The risks of hematoma, infection, poor bone healing and failure of fusion, cord injury, weakness, numbness, neck pain, stroke, and death were discussed in detail. All questions were answered and the patient elected to proceed with the surgery.     Procedure After obtaining informed consent, the patient was taken to the Operating Room where general anesthesia was induced and the patient intubated. neuromonitoring electrodes were placed and baseline MEP and SSEP found. Vascular access was obtained. Decadron was administered. The head was slightly extended and imaging used to identify a skin crease overlying the C5 vertebral body.     The patient was prepped and draped in the usual sterile fashion and a timeout was performed per protocol. Local anesthesia was instilled with epinephrine along the planned incision site. A transverse cervical incision was performed on the left in a skin crease. The incision was carried to the level of the platysma and then cautery was used to incise the muscle. Blunt dissection was used to expand the plane and the dissection  was carried deep medial to the SCM and carotid sheath being careful to identify the trachea and esophagus medially. The prevertebral fascia was identified and this was bluntly dissected to expose the disc spaces. A needle was placed in the disc space and x-ray confirmed the C4/5 disc level.     Next, cautery was used to undermine the longus colli muscles bilaterally and identify the C4/5 and C5/6 disc spaces. Caspar pins were placed at C4 and C6 and a retractor system placed under the muscles to complete the exposure. Next, a combination of curettes and Kerrison rongeurs were used to remove the anterior osteophyte at C4/5 and then the disc material. A 64mm matchstick was used to shave the endplates of the adjacent bodies. A trial spacer was used to size the graft and then hemostasis obtained. The microscope was brought into the field for the remainder of the surgery. The drill was used to remove the osteophyte/disc complex deep to the level of the PLL at C4/5. There was significant disc protrusion at this level that was removed with rongeurs. The PLL was entered with a hook and then the PLL was removed along with remaining disc material to decompress centrally and then out into bilateral neuro foramen. A blunt probe was used to confirm no residual stenosis laterally and a curette used to ensure no posterior osteophyte remained. Floseal was used for hemostasis.   Next, attention turned to the C5/6 disc space. The disc space was entered there sharply and the disc material removed in a similar fashion. The endplates were prepared using the hgh speed drill and then the posterior disc/osteophyte complex also  removed in similar fashion.  A trial spacer was used to size the graft. The PLL was entered with a hook and then the PLL was removed along with remaining disc material to decompress centrally and then out into bilateral neuro foramen. There was a large disc fragment noted on the right that was removed. A blunt  probe was used to confirm no residual stenosis laterally and a curette used to ensure no posterior osteophyte remained. Floseal was used for hemostasis.  Allograft was placed, 60mm in height and placed slightly recessed to the anterior edge of vertebral body at C5/6 and at C4/5.    X-ray confirmed good placement of grafts.  The caspar pins were removed and bone wax placed. The remainder of the osteophytes were drilled to allow for plating. Next, a 71mm plate was found to be the adequate size and was placed in the midline and secured with two 35mm screws at each bone level except 35mm screw at upper level. Xray was obtained confirming good graft placement and adequate depth of screws. The retractors were removed. The wound was irrigated copiously and hemostasis obtained. The platysma was closed with 2-0 vicryl suture. The dermis was closed with 3-0 Vicryl and Dermabond was placed on the skin.    The patient had general anesthesia reversed and was extubated following the procedure. She awoke following commands with symmetric movement. She was taken to the PACU where she continued recovery and then the ward.     ESTIMATED BLOOD LOSS:   20 cc   SPECIMENS None   IMPLANT PUTTY DBF 1CC CORTICAL FIBERS - [id number]  Inventory Item: PUTTY DBF 1CC CORTICAL FIBERS Serial no.: [id number] Model/Cat no.: [id number]  Implant name: PUTTY DBF 1CC CORTICAL FIBERS - [id number] Laterality: N/A Area: Spine Cervical  Manufacturer: [employer] Bing Quarry Date of Manufacture:    Action: Implanted Number Used: 1   Device Identifier:  Device Identifier Type:     BONE WEDGE CONERSTONE [id number] - [id number]  Inventory Item: BONE WEDGE CONERSTONE [id number] Serial no.: [id number] Model/Cat no.: [id number]  Implant name: BONE Gaynelle Arabian [id number] - [id number] Laterality: N/A Area: Spine Cervical  Manufacturer: [employer] Bing Quarry Date of Manufacture:    Action: Implanted Number Used: 1   Device Identifier:  Device Identifier  Type:     BONE WEDGE CONERSTONE [id number] - [id number]  Inventory Item: BONE WEDGE CONERSTONE [id number] Serial no.: [id number] Model/Cat no.: [id number]  Implant name: BONE Gaynelle Arabian [id number] - [id number] Laterality: N/A Area: Spine Cervical  Manufacturer: [employer] Bing Quarry Date of Manufacture:    Action: Implanted Number Used: 1   Device Identifier:  Device Identifier Type:     PLATE ZEVO 2LVL - [id number]  Inventory Item: PLATE ZEVO 2LVL Serial no.:  Model/Cat no.: [id number]  Implant name: PLATE ZEVO 2LVL - [id number] Laterality: N/A Area: Spine Cervical  Manufacturer: [employer] Bing Quarry Date of Manufacture:    Action: Implanted Number Used: 1   Device Identifier:  Device Identifier Type:     SCREW 3.5 SELFDRILL VARI - [id number]  Inventory Item: SCREW 3.5 SELFDRILL VARI Serial no.:  Model/Cat no.: [id number]  Implant name: SCREW 3.5 SELFDRILL VARI - [id number] Laterality: N/A Area: Spine Cervical  Manufacturer: [employer] Bing Quarry Date of Manufacture:    Action: Implanted Number Used: 7   Device Identifier:  Device Identifier Type:     SCREW - [id number]  Inventory Item: SCREW Serial no.:  Model/Cat no.: [id number]  Implant name: SCREW -  [id number] Laterality: N/A Area: Spine Cervical  Manufacturer: [employer] Bing Quarry Date of Manufacture:    Action: Implanted Number Used: 1   Device Identifier:  Device Identifier Type:          I performed the case in its entirety with assistance of PA, [facility]   Dominique Chris, MD [phone]

## 2021-02-09 NOTE — Anesthesia Procedure Notes (Signed)
Procedure Name: Intubation Performed by: Ionna Avis Ben, CRNA Pre-anesthesia Checklist: Patient identified, Emergency Drugs available, Suction available and Patient being monitored Patient Re-evaluated:Patient Re-evaluated prior to induction Oxygen Delivery Method: Circle system utilized Preoxygenation: Pre-oxygenation with 100% oxygen Induction Type: IV induction Ventilation: Mask ventilation without difficulty Laryngoscope Size: 4 and McGraph Grade View: Grade I Tube type: Oral Tube size: 7.0 mm Number of attempts: 1 Airway Equipment and Method: Stylet and Oral airway Placement Confirmation: ETT inserted through vocal cords under direct vision,  positive ETCO2 and breath sounds checked- equal and bilateral Secured at: 21 cm Tube secured with: Tape Dental Injury: Teeth and Oropharynx as per pre-operative assessment        

## 2021-02-09 NOTE — Transfer of Care (Signed)
Immediate Anesthesia Transfer of Care Note  Patient: Dominique Shea  Procedure(s) Performed: C4/5, C5/6 ANTERIOR CERVICAL DECOMPRESSION/DISCECTOMY FUSION (N/A )  Patient Location: PACU  Anesthesia Type:General  Level of Consciousness: awake and alert   Airway & Oxygen Therapy: Patient Spontanous Breathing and Patient connected to nasal cannula oxygen  Post-op Assessment: Report given to RN and Post -op Vital signs reviewed and stable  Post vital signs: Reviewed and stable  Last Vitals:  Vitals Value Taken Time  BP    Temp 37.1 C 02/09/21 1015  Pulse    Resp    SpO2      Last Pain:  Vitals:   02/09/21 1610  TempSrc: Oral  PainSc: 10-Worst pain ever         Complications: No complications documented.

## 2021-02-09 NOTE — Interval H&P Note (Signed)
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  The various methods of treatment have been discussed with the patient and family. After consideration of risks, benefits and other options for treatment, the patient has consented to  Procedure(s): C4/5, C5/6 ANTERIOR CERVICAL DECOMPRESSION/DISCECTOMY FUSION (N/A) as a surgical intervention.  The patient's history has been reviewed, patient examined, no change in status, stable for surgery.  I have reviewed the patient's chart and labs.  Questions were answered to the patient's satisfaction.     Dominique Shea

## 2021-02-09 NOTE — Progress Notes (Signed)
Pharmacy Antibiotic Note  Dominique Shea is a 30 y.o. female admitted on (Not on file) with surgical prophylaxis.  Pharmacy has been consulted for Cefazolin  dosing.  Plan: Cefazolin 2 gm IV X 1 60 min pre-op.     No data recorded.  No results for input(s): WBC, CREATININE, LATICACIDVEN, VANCOTROUGH, VANCOPEAK, VANCORANDOM, GENTTROUGH, GENTPEAK, GENTRANDOM, TOBRATROUGH, TOBRAPEAK, TOBRARND, AMIKACINPEAK, AMIKACINTROU, AMIKACIN in the last 168 hours.  Estimated Creatinine Clearance: 85.3 mL/min (by C-G formula based on SCr of 0.84 mg/dL).    Allergies  Allergen Reactions  . Peanut-Containing Drug Products Anaphylaxis  . Tomato Anaphylaxis  . Tylenol [Acetaminophen] Hives  . Doxycycline Swelling    Pt had facial swelling after receiving ms04, zofran, hydrocodone, tylenol, ivp dye, and doxycline. Unclear which was responsible agent.   . Ibuprofen Swelling  . Ivp Dye [Iodinated Diagnostic Agents] Swelling    Pt had facial swelling after receiving ms04, zofran, hydrocodone, tylenol, ivp dye, and doxycline. Unclear which was responsible agent.     Antimicrobials this admission:   >>    >>   Dose adjustments this admission:   Microbiology results:  BCx:   UCx:    Sputum:    MRSA PCR:   Thank you for allowing pharmacy to be a part of this patient's care.  Bilal Manzer D 02/09/2021 12:33 AM

## 2021-02-09 NOTE — Progress Notes (Signed)
OT Cancellation Note  Patient Details Name: Dominique Shea MRN: [id number] DOB: 10/01/1990   Cancelled Treatment:    Reason Eval/Treat Not Completed: Fatigue/lethargy limiting ability to participate. Consult received, chart reviewed. RN cleared OT to work with pt. Pt sleeping soundly upon attempt, sound machine setting on her phone, does not wake to gentle verbal cues. Will re-attempt next morning for OT evaluation.   Wynona Canes, MPH, MS, OTR/L ascom [phone] 02/09/21, 4:16 PM

## 2021-02-10 ENCOUNTER — Observation Stay: Payer: Medicaid Other

## 2021-02-10 ENCOUNTER — Encounter: Payer: Self-pay | Admitting: Neurosurgery

## 2021-02-10 DIAGNOSIS — M5412 Radiculopathy, cervical region: Secondary | ICD-10-CM | POA: Diagnosis not present

## 2021-02-10 DIAGNOSIS — M4322 Fusion of spine, cervical region: Secondary | ICD-10-CM | POA: Diagnosis not present

## 2021-02-10 DIAGNOSIS — Z981 Arthrodesis status: Secondary | ICD-10-CM | POA: Diagnosis not present

## 2021-02-10 MED ORDER — OXYCODONE HCL 5 MG PO TABS: 5.0000 mg | ORAL_TABLET | ORAL | 0 refills | Status: DC | PRN

## 2021-02-10 MED ORDER — LIDOCAINE 5 % EX PTCH: 1.0000 | MEDICATED_PATCH | Freq: Two times a day (BID) | CUTANEOUS | 0 refills | Status: DC

## 2021-02-10 MED ORDER — SODIUM CHLORIDE 0.9 % IV SOLN
INTRAVENOUS | Status: DC | PRN
  Administered 2021-02-10: 250 mL via INTRAVENOUS

## 2021-02-10 NOTE — Discharge Instructions (Signed)
NEUROSURGERY DISCHARGE INSTRUCTIONS  Admission diagnosis: Cervical radiculopathy [M54.12]  Operative procedure: C4-6 anterior cervical discectomy and fusion  What to do after you leave the hospital:  Recommended diet: regular diet. Increase protein intake to promote wound healing.  Recommended activity: no lifting or strenuous exercise for 6 weeks. .You should walk multiple times per day  Special Instructions  No straining, no heavy lifting > 10lbs x 6 weeks.  Keep incision area clean and dry. May shower in 2 days. No baths or pools for 6 weeks.  Please remove dressing tomorrow, no need to apply a bandage afterwards  You can continue to wear the collar as needed for comfort.  I encouraged times out of the collar to help with muscle strengthening.  Please avoid using cigarettes and nicotine products as these can interfere with the fusion.  You have no sutures to remove, the skin is closed with adhesive  Please take pain medications as directed. Take a stool softener if on pain medications   Please Report any of the following: Nausea or Vomiting, Temperature is greater than 101.36F (38.1C) degrees, Dizziness, Abdominal Pain, Difficulty Breathing or Shortness of Breath, Inability to Eat, drink Fluids, or Take medications, Bleeding, swelling, or drainage from surgical incision sites, New numbness or weakness, and Bowel or bladder dysfunction to the neurosurgeon on call at [phone]  Additional Follow up appointments Please follow up with Dr Adriana Simas in [place] [facility] as scheduled in 2-3 weeks   Please see below for scheduled appointments:  Future Appointments  Date Time Provider Department Center  02/10/2021  8:10 AM [facility]

## 2021-02-10 NOTE — Progress Notes (Signed)
Pt provided discharge instructions and all questions and concerns addressed at this time. Pt refused all morning medications and states she will take them as soon as she gets home as scheduled. Pt ambulating with steady gait and collar in place upon discharge.   02/10/21 0905  Assess: MEWS Score  Temp 97.6 F (36.4 C)  BP (!) 141/109  Pulse Rate (!) 58  Resp 18  SpO2 100 %  O2 Device Room Air  Assess: MEWS Score  MEWS Temp 0  MEWS Systolic 0  MEWS Pulse 0  MEWS RR 0  MEWS LOC 0  MEWS Score 0  MEWS Score Color [place] Si

## 2021-02-10 NOTE — Discharge Summary (Signed)
Physician Discharge Summary  Patient ID: Dominique Shea MRN: [id number] DOB/AGE: 24-Mar-1991 30 y.o.  Admit date: 02/09/2021 Discharge date: 02/10/2021  Admission Diagnoses: Cervical radiculopathy  Discharge Diagnoses:  Active Problems:   Cervical radiculopathy   Discharged Condition: good  Hospital Course:  Dominique Shea was admitted to the hospital 5/16 and taken to the operating room for the C4-C6 ACDF.  This was uncomplicated and the patient was taken to the ward postoperative for care.  There, she was seen to be at her neurologic baseline with improved right arm pain.  She was having some neck pain and we treated this with medication.  She was able to tolerate a diet.  On postop day 1, she was seen ambulating, with no increased pain.  She was meeting all discharge criteria and she was discharged home on 5/17.   Discharge Exam: Blood pressure (!) 129/93, pulse 66, temperature 98.8 F (37.1 C), temperature source Oral, resp. rate 17, height 5\' 4"  (1.626 m), weight 59.6 kg, last menstrual period 01/17/2021, SpO2 99 %.  Awake, alert Neck is flat, collar is on 5 out of 5 strength in bilateral upper and lower extremities Decreased sensation in the right thumb, otherwise intact throughout all extremities   Disposition: Discharge disposition: 01-Home or Self Care       Discharge Instructions    Diet - low sodium heart healthy   Complete by: As directed    Incentive spirometry RT   Complete by: As directed    Increase activity slowly   Complete by: As directed      Allergies as of 02/10/2021      Reactions   Peanut-containing Drug Products Anaphylaxis   Tomato Anaphylaxis   Tylenol [acetaminophen] Hives   Doxycycline Swelling   Pt had facial swelling after receiving ms04, zofran, hydrocodone, tylenol, ivp dye, and doxycline. Unclear which was responsible agent.    Ibuprofen Swelling   Ivp Dye [iodinated Diagnostic Agents] Swelling   Pt had facial swelling after receiving  ms04, zofran, hydrocodone, tylenol, ivp dye, and doxycline. Unclear which was responsible agent.       Medication List    STOP taking these medications   EXCEDRIN PO   methocarbamol 500 MG tablet Commonly known as: ROBAXIN   methylPREDNISolone 4 MG Tbpk tablet Commonly known as: MEDROL DOSEPAK     TAKE these medications   DULoxetine 30 MG capsule Commonly known as: CYMBALTA Take 30 mg by mouth every morning.   gabapentin 300 MG capsule Commonly known as: NEURONTIN Take 1 capsule by mouth 3 (three) times daily.   lidocaine 5 % Commonly known as: Lidoderm Place 1 patch onto the skin every 12 (twelve) hours. Remove & Discard patch within 12 hours or as directed by MD   orphenadrine 100 MG tablet Commonly known as: NORFLEX Take 1 tablet (100 mg total) by mouth 2 (two) times daily.   oxyCODONE 5 MG immediate release tablet Commonly known as: Oxy IR/ROXICODONE Take 1 tablet (5 mg total) by mouth every 4 (four) hours as needed for moderate pain ((score 4 to 6)). What changed:   reasons to take this  Another medication with the same name was removed. Continue taking this medication, and follow the directions you see here.        Signed: 02/12/2021 02/10/2021, 8:11 AM

## 2021-02-11 ENCOUNTER — Telehealth: Payer: Self-pay | Admitting: *Deleted

## 2021-02-11 NOTE — Telephone Encounter (Signed)
Transition Care Management Unsuccessful Follow-up Telephone Call  Date of discharge and from where:  02/10/2021 [facility]  Attempts:  1st Attempt  Reason for unsuccessful TCM follow-up call:  Left voice message

## 2021-02-12 NOTE — Telephone Encounter (Signed)
Transition Care Management Unsuccessful Follow-up Telephone Call  Date of discharge and from where:  02/10/2021 [facility]  Attempts:  2nd Attempt  Reason for unsuccessful TCM follow-up call:  Left voice message

## 2021-02-13 NOTE — Telephone Encounter (Signed)
Transition Care Management Unsuccessful Follow-up Telephone Call  Date of discharge and from where:  02/10/2021 from [facility]  Attempts:  3rd Attempt  Reason for unsuccessful TCM follow-up call:  Unable to reach patient

## 2021-06-23 DIAGNOSIS — Z981 Arthrodesis status: Secondary | ICD-10-CM | POA: Diagnosis not present

## 2021-06-23 DIAGNOSIS — M4322 Fusion of spine, cervical region: Secondary | ICD-10-CM | POA: Diagnosis not present

## 2021-07-01 ENCOUNTER — Telehealth: Payer: Self-pay

## 2021-07-01 NOTE — Telephone Encounter (Signed)
..   Medicaid Managed Care   Unsuccessful Outreach Note  07/01/2021 Name: Dominique Shea MRN: [id number] DOB: [birth year]  Referred by: [employer] Reason for referral : High Risk Managed Medicaid (I called the patient today to get her scheduled for aphone  visit with the [facility] Team. The call would not ring and then it would disconnect.I tried several times.)   An unsuccessful telephone outreach was attempted today. The patient was referred to the case management team for assistance with care management and care coordination.   Follow Up Plan: The care management team will reach out to the patient again over the next 7-14 days.   Weston Settle Care Guide, High Risk Medicaid Managed Care Embedded Care Coordination [facility]  [facility]

## 2021-08-04 ENCOUNTER — Telehealth: Payer: Self-pay

## 2021-08-04 NOTE — Telephone Encounter (Signed)
  Medicaid Managed Care   Unsuccessful Outreach Note  08/04/2021 Name: Dominique Shea MRN: [id number] DOB: 1991-01-15  Referred by: [employer] Reason for referral : High Risk Managed Medicaid (I called the patient today to offer the services of the [facility] Team. She did not answer and I was not able to leave a message.)   A second unsuccessful telephone outreach was attempted today. The patient was referred to the case management team for assistance with care management and care coordination.   Follow Up Plan: The care management team will reach out to the patient again over the next 14 days.    Weston Settle Care Guide, High Risk Medicaid Managed Care Embedded Care Coordination [facility]  [facility]    .j

## 2021-12-10 ENCOUNTER — Telehealth: Payer: Self-pay

## 2021-12-10 NOTE — Telephone Encounter (Signed)
.. ?  Medicaid Managed Care  ? ?Unsuccessful Outreach Note ? ?12/10/2021 ?Name: Dominique Shea MRN: [id number] DOB: January 23, 1991 ? ?Referred by: [employer] ?Reason for referral : High Risk Managed Medicaid (I called the patient today to offer the services of the [facility] Team. I left my name and number on her VM.) ? ? ?Third unsuccessful telephone outreach was attempted today. The patient was referred to the case management team for assistance with care management and care coordination. The patient's primary care provider has been notified of our unsuccessful attempts to make or maintain contact with the patient. The care management team is pleased to engage with this patient at any time in the future should he/she be interested in assistance from the care management team.  ? ?Follow Up Plan: We have been unable to make contact with the patient for follow up. The care management team is available to follow up with the patient after provider conversation with the patient regarding recommendation for care management engagement and subsequent re-referral to the care management team.  ? ?Weston Settle ?Care Guide, High Risk Medicaid Managed Care ?Embedded Care Coordination ?[place]  [facility]  ? ?

## 2021-12-26 ENCOUNTER — Encounter: Payer: Self-pay | Admitting: Intensive Care

## 2021-12-26 ENCOUNTER — Other Ambulatory Visit: Payer: Self-pay

## 2021-12-26 ENCOUNTER — Emergency Department
Admission: EM | Admit: 2021-12-26 | Discharge: 2021-12-26 | Disposition: A | Discharge status: Home / Self Care | Payer: Medicaid Other | Attending: Emergency Medicine | Admitting: Emergency Medicine

## 2021-12-26 DIAGNOSIS — R519 Headache, unspecified: Secondary | ICD-10-CM | POA: Diagnosis not present

## 2021-12-26 MED ORDER — METOCLOPRAMIDE HCL 10 MG PO TABS: 10.0000 mg | ORAL_TABLET | Freq: Three times a day (TID) | ORAL | 0 refills | Status: DC

## 2021-12-26 MED ORDER — DIPHENHYDRAMINE HCL 25 MG PO CAPS
50.0000 mg | ORAL_CAPSULE | Freq: Once | ORAL | Status: AC
  Administered 2021-12-26: 50 mg via ORAL
  Filled 2021-12-26: qty 2

## 2021-12-26 MED ORDER — METOCLOPRAMIDE HCL 10 MG PO TABS
10.0000 mg | ORAL_TABLET | Freq: Once | ORAL | Status: AC
  Administered 2021-12-26: 10 mg via ORAL
  Filled 2021-12-26: qty 1

## 2021-12-26 MED ORDER — CYCLOBENZAPRINE HCL 5 MG PO TABS: 5.0000 mg | ORAL_TABLET | Freq: Three times a day (TID) | ORAL | 0 refills | Status: DC | PRN

## 2021-12-26 MED ORDER — ORPHENADRINE CITRATE 30 MG/ML IJ SOLN
60.0000 mg | INTRAMUSCULAR | Status: AC
  Administered 2021-12-26: 60 mg via INTRAMUSCULAR
  Filled 2021-12-26: qty 2

## 2021-12-26 MED ORDER — KETOROLAC TROMETHAMINE 60 MG/2ML IM SOLN
60.0000 mg | Freq: Once | INTRAMUSCULAR | Status: AC
  Administered 2021-12-26: 60 mg via INTRAMUSCULAR
  Filled 2021-12-26: qty 2

## 2021-12-26 NOTE — Discharge Instructions (Signed)
Take OTC Tylenol (1000 mg) along with OTC ibuprofen (600-800 mg) and OTC Benadryl along with the prescription medicines to reduce headache pain. Follow-up with your provider for continued symptoms. Return to the Emergency Department as needed.  ?

## 2021-12-26 NOTE — ED Provider Notes (Signed)
? ? ?[facility] ?Emergency Department Provider Note ? ? ? ? Event Date/Time  ? First MD Initiated Contact with Patient 12/26/21 1655   ?  (approximate) ? ? ?History  ? ?Migraine ? ? ?HPI ? ?Dominique Shea is a 31 y.o. female presents to the ED for evaluation of headache for the last 2 days.  Patient denies any benefit with taking OTC Excedrin.  She gives a history of approximately hypertension but denies any current treatment.  She denies any vision loss, vertigo, tinnitus, or dizziness.  He does report some light sensitivity, but denies any nausea or vomiting. ?  ? ? ?Physical Exam  ? ?Triage Vital Signs: ?ED Triage Vitals  ?Enc Vitals Group  ?   BP 12/26/21 1644 (!) 157/110  ?   Pulse Rate 12/26/21 1644 72  ?   Resp 12/26/21 1644 18  ?   Temp 12/26/21 1644 98.3 ?F (36.8 ?C)  ?   Temp Source 12/26/21 1644 Oral  ?   SpO2 12/26/21 1644 98 %  ?   Weight 12/26/21 1645 145 lb (65.8 kg)  ?   Height 12/26/21 1645 5\' 4"  (1.626 m)  ?   Head Circumference --   ?   Peak Flow --   ?   Pain Score 12/26/21 1645 10  ?   Pain Loc --   ?   Pain Edu? --   ?   Excl. in GC? --   ? ? ?Most recent vital signs: ?Vitals:  ? 12/26/21 1644 12/26/21 1928  ?BP: (!) 157/110 (!) 126/96  ?Pulse: 72   ?Resp: 18   ?Temp: 98.3 ?F (36.8 ?C)   ?SpO2: 98%   ? ? ?General Awake, no distress.  ?HEENT NCAT. PERRL. EOMI. No rhinorrhea. Mucous membranes are moist.  ?CV:  Good peripheral perfusion.  ?RESP:  Normal effort.  ?ABD:  No distention.  ? ? ?ED Results / Procedures / Treatments  ? ?Labs ?(all labs ordered are listed, but only abnormal results are displayed) ?Labs Reviewed - No data to display ? ? ? ?EKG ? ? ?RADIOLOGY ? ? ?No results found. ? ? ?PROCEDURES: ? ?Critical Care performed: No ? ?Procedures ? ? ?MEDICATIONS ORDERED IN ED: ?Medications  ?ketorolac (TORADOL) injection 60 mg (60 mg Intramuscular Given 12/26/21 1803)  ?orphenadrine (NORFLEX) injection 60 mg (60 mg Intramuscular Given 12/26/21 1801)  ?diphenhydrAMINE  (BENADRYL) capsule 50 mg (50 mg Oral Given 12/26/21 1800)  ?metoCLOPramide (REGLAN) tablet 10 mg (10 mg Oral Given 12/26/21 1800)  ? ? ? ?IMPRESSION / MDM / ASSESSMENT AND PLAN / ED COURSE  ?I reviewed the triage vital signs and the nursing notes. ?             ?               ? ?Differential diagnosis includes, but is not limited to, sinusitis, headache, head trauma, hypertension related symptoms ? ?Patient to the ED for evaluation of persistent headache for the last 2 days.  Patient presents in no acute distress.  She is without toxic appearance and shows no signs of acute neuromuscular deficits.  Exam is reassuring except for some elevated blood pressure without a diagnosis of hypertension.  Patient reports improvement of headache syndrome after ED medication administration.  She reports downtrending pain at 8 out of 10 at this time.  Patient is alert, talkative, and stable for discharge.  Patient's diagnosis is consistent with general headache. Patient will be discharged home with  prescriptions for cyclobenzaprine and Flexeril. Patient is to follow up with her primary provider as needed or otherwise directed. Patient is given ED precautions to return to the ED for any worsening or new symptoms. ? ? ? ?FINAL CLINICAL IMPRESSION(S) / ED DIAGNOSES  ? ?Final diagnoses:  ?Bad headache  ? ? ? ?Rx / DC Orders  ? ?ED Discharge Orders   ? ?      Ordered  ?  metoCLOPramide (REGLAN) 10 MG tablet  3 times daily with meals       ? 12/26/21 1924  ?  cyclobenzaprine (FLEXERIL) 5 MG tablet  3 times daily PRN       ? 12/26/21 1924  ? ?  ?  ? ?  ? ? ? ?Note:  This document was prepared using Dragon voice recognition software and may include unintentional dictation errors. ? ?  ?Lissa Hoard, PA-C ?12/26/21 1938 ? ?  ?Phineas Semen, MD ?12/26/21 2109 ? ?

## 2021-12-26 NOTE — ED Triage Notes (Addendum)
Patient reports migraine X2 days. No relief with Excedrin. Patient reports she has hx hypertension and used to be on blood pressure medication but never got it refilled years ago.  ?

## 2021-12-29 NOTE — Telephone Encounter (Signed)
Transition Care Management Unsuccessful Follow-up Telephone Call ? ?Date of discharge and from where:  12/26/2021[facility] ? ?Attempts:  2nd Attempt ? ?Reason for unsuccessful TCM follow-up call:  Left voice message ? ?  ?

## 2023-04-19 ENCOUNTER — Other Ambulatory Visit: Payer: Self-pay

## 2023-04-19 ENCOUNTER — Emergency Department: Payer: 59

## 2023-04-19 ENCOUNTER — Encounter: Payer: Self-pay | Admitting: Oncology

## 2023-04-19 ENCOUNTER — Emergency Department
Admission: EM | Admit: 2023-04-19 | Discharge: 2023-04-19 | Disposition: A | Discharge status: Home / Self Care | Payer: 59 | Attending: Emergency Medicine | Admitting: Emergency Medicine

## 2023-04-19 DIAGNOSIS — J0100 Acute maxillary sinusitis, unspecified: Secondary | ICD-10-CM | POA: Insufficient documentation

## 2023-04-19 DIAGNOSIS — R519 Headache, unspecified: Secondary | ICD-10-CM

## 2023-04-19 DIAGNOSIS — M791 Myalgia, unspecified site: Secondary | ICD-10-CM | POA: Insufficient documentation

## 2023-04-19 DIAGNOSIS — Z20822 Contact with and (suspected) exposure to covid-19: Secondary | ICD-10-CM | POA: Insufficient documentation

## 2023-04-19 DIAGNOSIS — R059 Cough, unspecified: Secondary | ICD-10-CM | POA: Diagnosis not present

## 2023-04-19 DIAGNOSIS — I10 Essential (primary) hypertension: Secondary | ICD-10-CM | POA: Diagnosis not present

## 2023-04-19 LAB — RESP PANEL BY RT-PCR (RSV, FLU A&B, COVID)  RVPGX2
Influenza A by PCR: NEGATIVE
Influenza B by PCR: NEGATIVE
Resp Syncytial Virus by PCR: NEGATIVE
SARS Coronavirus 2 by RT PCR: NEGATIVE

## 2023-04-19 LAB — GROUP A STREP BY PCR: Group A Strep by PCR: NOT DETECTED

## 2023-04-19 MED ORDER — AMLODIPINE BESYLATE 5 MG PO TABS: 5.0000 mg | ORAL_TABLET | Freq: Every day | ORAL | 0 refills | Status: DC

## 2023-04-19 MED ORDER — AMOXICILLIN 875 MG PO TABS: 875.0000 mg | ORAL_TABLET | Freq: Two times a day (BID) | ORAL | 0 refills | Status: DC

## 2023-04-19 MED ORDER — ONDANSETRON HCL 4 MG/2ML IJ SOLN
4.0000 mg | Freq: Once | INTRAMUSCULAR | Status: AC
  Administered 2023-04-19: 4 mg via INTRAVENOUS
  Filled 2023-04-19: qty 2

## 2023-04-19 MED ORDER — AMLODIPINE BESYLATE 5 MG PO TABS
5.0000 mg | ORAL_TABLET | Freq: Once | ORAL | Status: AC
  Administered 2023-04-19: 5 mg via ORAL
  Filled 2023-04-19: qty 1

## 2023-04-19 MED ORDER — MORPHINE SULFATE (PF) 4 MG/ML IV SOLN
4.0000 mg | Freq: Once | INTRAVENOUS | Status: AC
  Administered 2023-04-19: 4 mg via INTRAVENOUS
  Filled 2023-04-19: qty 1

## 2023-04-19 MED ORDER — DEXAMETHASONE SODIUM PHOSPHATE 10 MG/ML IJ SOLN
10.0000 mg | Freq: Once | INTRAMUSCULAR | Status: AC
  Administered 2023-04-19: 10 mg via INTRAVENOUS
  Filled 2023-04-19: qty 1

## 2023-04-19 MED ORDER — FENTANYL CITRATE PF 50 MCG/ML IJ SOSY
50.0000 ug | PREFILLED_SYRINGE | Freq: Once | INTRAMUSCULAR | Status: AC
  Administered 2023-04-19: 50 ug via INTRAVENOUS
  Filled 2023-04-19: qty 1

## 2023-04-19 MED ORDER — IPRATROPIUM-ALBUTEROL 0.5-2.5 (3) MG/3ML IN SOLN
3.0000 mL | Freq: Once | RESPIRATORY_TRACT | Status: AC
  Administered 2023-04-19: 3 mL via RESPIRATORY_TRACT
  Filled 2023-04-19: qty 3

## 2023-04-19 MED ORDER — SODIUM CHLORIDE 0.9 % IV BOLUS
1000.0000 mL | Freq: Once | INTRAVENOUS | Status: AC
  Administered 2023-04-19: 1000 mL via INTRAVENOUS

## 2023-04-19 NOTE — ED Triage Notes (Signed)
Pt presents to ER with c/o HA, cough, sore throat and generalized body aches x1 week.  Pt states she was tested for COVID yesterday and was negative.  Pt states she has not had a fever.  Denies any recent sick contacts.  Pt otherwise A&O x4 and in NAD in triage.

## 2023-04-19 NOTE — ED Provider Notes (Signed)
[facility] Provider Note    Event Date/Time   First MD Initiated Contact with Patient 04/19/23 0710     (approximate)   History   Headache and Generalized Body Aches   HPI  Dominique Shea is a 32 y.o. female with history of thrombocytopenia, anxiety and anemia presents emergency department with cough, headache, sore throat, and bodyaches for 1 week.  Had a COVID test yesterday which was negative.  Unsure if she has had a fever but states she feels hot and cold.  No chest pain or shortness of breath      Physical Exam   Triage Vital Signs: ED Triage Vitals  Encounter Vitals Group     BP 04/19/23 0646 (!) 171/130     Systolic BP Percentile --      Diastolic BP Percentile --      Pulse Rate 04/19/23 0646 93     Resp 04/19/23 0646 20     Temp 04/19/23 0646 98.2 F (36.8 C)     Temp Source 04/19/23 0646 Oral     SpO2 04/19/23 0646 97 %     Weight 04/19/23 0648 154 lb (69.9 kg)     Height 04/19/23 0648 5\' 4"  (1.626 m)     Head Circumference --      Peak Flow --      Pain Score 04/19/23 0648 10     Pain Loc --      Pain Education --      Exclude from Growth Chart --     Most recent vital signs: Vitals:   04/19/23 1009 04/19/23 1035  BP: (!) 152/110   Pulse: 90   Resp: 18   Temp:  98 F (36.7 C)  SpO2: 98%      General: Awake, no distress.   CV:  Good peripheral perfusion. regular rate and  rhythm Resp:  Normal effort. Lungs slight wheezing noted in both lungs bilaterally Abd:  No distention.   Other:  TMs clear, throat has reddened tonsils that are swollen, no exudate, neck is supple, no lymphadenopathy   ED Results / Procedures / Treatments   Labs (all labs ordered are listed, but only abnormal results are displayed) Labs Reviewed  RESP PANEL BY RT-PCR (RSV, FLU A&B, COVID)  [id number]  GROUP A STREP BY PCR     EKG     RADIOLOGY Chest x-ray    PROCEDURES:   Procedures   MEDICATIONS ORDERED IN  ED: Medications  ipratropium-albuterol (DUONEB) 0.5-2.5 (3) MG/3ML nebulizer solution 3 mL (3 mLs Nebulization Given 04/19/23 0731)  amLODipine (NORVASC) tablet 5 mg (5 mg Oral Given 04/19/23 0741)  sodium chloride 0.9 % bolus 1,000 mL (0 mLs Intravenous Stopped 04/19/23 1100)  ondansetron (ZOFRAN) injection 4 mg (4 mg Intravenous Given 04/19/23 0854)  morphine (PF) 4 MG/ML injection 4 mg (4 mg Intravenous Given 04/19/23 0904)  dexamethasone (DECADRON) injection 10 mg (10 mg Intravenous Given 04/19/23 1002)  fentaNYL (SUBLIMAZE) injection 50 mcg (50 mcg Intravenous Given 04/19/23 1002)     IMPRESSION / MDM / ASSESSMENT AND PLAN / ED COURSE  I reviewed the triage vital signs and the nursing notes.                              Differential diagnosis includes, but is not limited to, COVID, bronchitis, strep throat, viral URI  Patient's presentation is most consistent with acute illness / injury with  system symptoms.   Chest x-ray independently reviewed and interpreted by me as being negative for any acute abnormality  Patient was given DuoNeb while here in the ED.  Patient's blood pressure is elevated at 171/118.  Will give her amlodipine 5 mg here in the ED.  Does have family history of blood pressure problems.  States she took Mucinex last night but has not had any other cold medications.   Given amlodipine 5 mg p.o.  Normal saline 1 L IV, Decadron 10 mg IV, morphine 4 mg IV and Zofran 4 mg IV.  Patient continues to have a headache so gave her fentanyl 50 mcg IV  CT of the head was independently reviewed interpreted by me as being negative for any acute abnormality.  Radiologist does comment that there is some sinus inflammation  Chest x-ray independently reviewed interpreted by me as being negative for any acute abnormality   labs are reassuring, negative respiratory panel and strep test  Patient does feel little bit better after the medication.  Will start her on amoxicillin for the  sinus infection.  She is to take Tylenol and ibuprofen for pain.  Drink plenty of fluids.  She was given a work note.  Encouraged to follow-up with her regular doctor if not improving to 3 days.  She is to return emergency department if worsening.  We did discuss signs and symptoms of meningitis which she does not have this time but if she is worsening she will need to be evaluated.  Patient is in agreement treatment plan.  She is discharged stable condition.    FINAL CLINICAL IMPRESSION(S) / ED DIAGNOSES   Final diagnoses:  Acute maxillary sinusitis, recurrence not specified  Bad headache  Primary hypertension     Rx / DC Orders   ED Discharge Orders          Ordered    amLODipine (NORVASC) 5 MG tablet  Daily        04/19/23 0859    amoxicillin (AMOXIL) 875 MG tablet  2 times daily        04/19/23 0859             Note:  This document was prepared using Dragon voice recognition software and may include unintentional dictation errors.    Faythe Ghee, PA-C 04/19/23 1146    Minna Antis, MD 04/19/23 1419

## 2023-04-19 NOTE — ED Notes (Signed)
States she still had a headache  maybe 9/10  No vomiting  But states she is having some blurriness to left eye

## 2023-04-19 NOTE — Discharge Instructions (Signed)
Follow-up with your regular doctor if not improved in 3 days.  Return emergency department worsening.  Take medications as prescribed.

## 2023-05-17 ENCOUNTER — Encounter: Payer: Self-pay | Admitting: Oncology

## 2023-05-24 ENCOUNTER — Other Ambulatory Visit: Payer: Self-pay

## 2023-05-24 ENCOUNTER — Other Ambulatory Visit (HOSPITAL_COMMUNITY)
Admission: RE | Admit: 2023-05-24 | Discharge: 2023-05-24 | Disposition: A | Discharge status: Home / Self Care | Payer: 59 | Source: Ambulatory Visit | Attending: Nurse Practitioner | Admitting: Nurse Practitioner

## 2023-05-24 ENCOUNTER — Encounter: Payer: Self-pay | Admitting: Nurse Practitioner

## 2023-05-24 ENCOUNTER — Ambulatory Visit (INDEPENDENT_AMBULATORY_CARE_PROVIDER_SITE_OTHER): Payer: 59 | Admitting: Nurse Practitioner

## 2023-05-24 VITALS — BP 142/98 | HR 89 | Temp 97.9°F | Resp 16 | Ht 64.0 in | Wt 153.9 lb

## 2023-05-24 DIAGNOSIS — N760 Acute vaginitis: Secondary | ICD-10-CM | POA: Insufficient documentation

## 2023-05-24 DIAGNOSIS — Z0001 Encounter for general adult medical examination with abnormal findings: Secondary | ICD-10-CM | POA: Diagnosis not present

## 2023-05-24 DIAGNOSIS — Z130 Encounter for screening for diseases of the blood and blood-forming organs and certain disorders involving the immune mechanism: Secondary | ICD-10-CM | POA: Diagnosis not present

## 2023-05-24 DIAGNOSIS — Z23 Encounter for immunization: Secondary | ICD-10-CM | POA: Diagnosis not present

## 2023-05-24 DIAGNOSIS — Z1151 Encounter for screening for human papillomavirus (HPV): Secondary | ICD-10-CM | POA: Diagnosis not present

## 2023-05-24 DIAGNOSIS — Z0000 Encounter for general adult medical examination without abnormal findings: Secondary | ICD-10-CM | POA: Insufficient documentation

## 2023-05-24 DIAGNOSIS — A5901 Trichomonal vulvovaginitis: Secondary | ICD-10-CM | POA: Diagnosis not present

## 2023-05-24 DIAGNOSIS — D509 Iron deficiency anemia, unspecified: Secondary | ICD-10-CM | POA: Diagnosis not present

## 2023-05-24 DIAGNOSIS — Z124 Encounter for screening for malignant neoplasm of cervix: Secondary | ICD-10-CM

## 2023-05-24 DIAGNOSIS — D693 Immune thrombocytopenic purpura: Secondary | ICD-10-CM | POA: Diagnosis not present

## 2023-05-24 DIAGNOSIS — Z1159 Encounter for screening for other viral diseases: Secondary | ICD-10-CM

## 2023-05-24 DIAGNOSIS — Z131 Encounter for screening for diabetes mellitus: Secondary | ICD-10-CM | POA: Diagnosis not present

## 2023-05-24 DIAGNOSIS — M5412 Radiculopathy, cervical region: Secondary | ICD-10-CM | POA: Diagnosis not present

## 2023-05-24 DIAGNOSIS — I10 Essential (primary) hypertension: Secondary | ICD-10-CM | POA: Diagnosis not present

## 2023-05-24 DIAGNOSIS — Z13220 Encounter for screening for lipoid disorders: Secondary | ICD-10-CM | POA: Diagnosis not present

## 2023-05-24 DIAGNOSIS — B9689 Other specified bacterial agents as the cause of diseases classified elsewhere: Secondary | ICD-10-CM | POA: Insufficient documentation

## 2023-05-24 DIAGNOSIS — Z114 Encounter for screening for human immunodeficiency virus [HIV]: Secondary | ICD-10-CM

## 2023-05-24 DIAGNOSIS — B3731 Acute candidiasis of vulva and vagina: Secondary | ICD-10-CM | POA: Insufficient documentation

## 2023-05-24 DIAGNOSIS — Z7689 Persons encountering health services in other specified circumstances: Secondary | ICD-10-CM

## 2023-05-24 DIAGNOSIS — Z01419 Encounter for gynecological examination (general) (routine) without abnormal findings: Secondary | ICD-10-CM | POA: Diagnosis not present

## 2023-05-24 DIAGNOSIS — D696 Thrombocytopenia, unspecified: Secondary | ICD-10-CM

## 2023-05-24 DIAGNOSIS — Z113 Encounter for screening for infections with a predominantly sexual mode of transmission: Secondary | ICD-10-CM

## 2023-05-24 MED ORDER — HYDROCHLOROTHIAZIDE 12.5 MG PO TABS: 12.5000 mg | ORAL_TABLET | Freq: Every day | ORAL | 1 refills | Status: DC

## 2023-05-24 MED ORDER — AMLODIPINE BESYLATE 10 MG PO TABS: 10.0000 mg | ORAL_TABLET | Freq: Every day | ORAL | 1 refills | Status: DC

## 2023-05-24 NOTE — Progress Notes (Signed)
Name: Dominique Shea   MRN: [id number]    DOB: 23-Jul-1991   Date:05/24/2023       Progress Note  Subjective  Chief Complaint  Chief Complaint  Patient presents with   Establish Care    HPI  Patient presents for annual CPE, establish care  Establish care: her last physical was years ago.  Medical history includes HTN, neck surgery.  Family history includes HTN, migraines.  Health maintenance due for labs, pap, Tdap.   Hypertension:  -Medications: has been out of medication but was prescribed amlodipine in the er on 04/19/2023, patient reports she was taking 2 for a total of 10 mg daily.  She says she has been out for a few days.  Will send in new prescription for amlodipine 10 mg daily and add on hydrochlorothiazide 12.5 mg daily.  Follow up in 4 weeks. To check labs -Patient is compliant with above medications and reports no side effects. -Checking BP at home (average): running high -Denies any SOB, CP, vision changes, LE edema or symptoms of hypotension -Diet: recommend DASH diet  -Exercise: recommend 150 min of physical activity weekly       05/24/2023    9:43 AM 04/19/2023   10:09 AM 04/19/2023    8:02 AM  Vitals with BMI  Height 5\' 4"     Weight 153 lbs 14 oz    BMI 26.4    Systolic 142 152 409  Diastolic 98 110 92  Pulse 89 90 88    Thrombocytopenia/iron deficiency anemia:  has history, will get labs, has been seen by hematology back in 2019    Latest Ref Rng & Units 01/26/2021   11:50 AM 03/23/2018    9:49 AM 03/14/2018    4:15 PM  CBC  WBC 4.0 - 10.5 K/uL 10.0  5.1  7.0   Hemoglobin 12.0 - 15.0 g/dL 81.1  91.4  78.2   Hematocrit 36.0 - 46.0 % 42.1  39.4  37.0   Platelets 150 - 400 K/uL 132  77  101     Cervical radiculopathy: patient reports she has previously had neck surgery. Had a spinal fusion back in 2022.   She says she continues to have pain and has been taking Excedrin which helps but she thinks she is taking too much. She says she takes 4 tabs at a time  two times a day. She is going to send me a picture of what she is taking and send it through my chart.   Notes from neurosurgery:  Ms. Curl is here 4 and 1/2 months out from her ACDF. She appears to be doing very well but has had a recent exacerbation of acute neck pain. I do not see anything concerning on her x-rays today. I do think this is more musculoskeletal in nature. I encouraged her to do neck range of motion exercises and use topical agents such as the lidocaine patches. If she does develop any more severe or new symptoms in the future, I would recommend repeat x-rays at that time. Otherwise she is cleared to continue with return to full activity  Plan: Recommend conservative care for her musculoskeletal neck pain.   Diet: Regular, well balanced diet Exercise: 5 days 60 minutes  Last Eye Exam: Never Last Dental Exam: 2023  Flowsheet Row Office Visit from 05/24/2023 in [facility]  AUDIT-C Score 0      Depression: Phq 9 is  negative    05/24/2023    9:43  AM 09/11/2020    3:14 PM  Depression screen PHQ 2/9  Decreased Interest 0 0  Down, Depressed, Hopeless 0 0  PHQ - 2 Score 0 0   Hypertension: BP Readings from Last 3 Encounters:  05/24/23 (!) 142/98  04/19/23 (!) 152/110  12/26/21 (!) 126/96   Obesity: Wt Readings from Last 3 Encounters:  05/24/23 153 lb 14.4 oz (69.8 kg)  04/19/23 154 lb (69.9 kg)  12/26/21 145 lb (65.8 kg)   BMI Readings from Last 3 Encounters:  05/24/23 26.42 kg/m  04/19/23 26.43 kg/m  12/26/21 24.89 kg/m     Vaccines:  HPV: up to at age 37 , ask insurance if age between 51-45  Shingrix: 26-64 yo and ask insurance if covered when patient above 31 yo Pneumonia:  educated and discussed with patient. Flu:  educated and discussed with patient.     Hep C Screening: ordered STD testing and prevention (HIV/chl/gon/syphilis): ordered Intimate partner violence: negative screen  Sexual History : yes, tubal  ligation Menstrual History/LMP/Abnormal Bleeding: [facility]: 05/11/2023 Discussed importance of follow up if any post-menopausal bleeding: yes  Incontinence Symptoms: negative for symptoms   Breast cancer:  - Last Mammogram: NA - BRCA gene screening: none  Osteoporosis Prevention : Discussed high calcium and vitamin D supplementation, weight bearing exercises Bone density :not applicable   Cervical cancer screening: due  Skin cancer: Discussed monitoring for atypical lesions  Colorectal cancer: NA   Lung cancer:  Low Dose CT Chest recommended if Age 86-80 years, 20 pack-year currently smoking OR have quit w/in 15years. Patient does not qualify for screen   ECG: 02/04/2021  Advanced Care Planning: A voluntary discussion about advance care planning including the explanation and discussion of advance directives.  Discussed health care proxy and Living will, and the patient was able to identify a health care proxy as mom.  Patient does not have a living will and power of attorney of health care   Lipids: No results found for: "CHOL" No results found for: "HDL" No results found for: "LDLCALC" No results found for: "TRIG" No results found for: "CHOLHDL" No results found for: "LDLDIRECT"  Glucose: Glucose  Date Value Ref Range Status  10/03/2014 96 65 - 99 mg/dL Final  07/24/2535 96 65 - 99 mg/dL Final  64/40/3474 99 65 - 99 mg/dL Final   Glucose, Bld  Date Value Ref Range Status  01/26/2021 105 (H) 70 - 99 mg/dL Final    Comment:    Glucose reference range applies only to samples taken after fasting for at least 8 hours.  01/22/2018 99 65 - 99 mg/dL Final  25/95/6387 564 (H) 65 - 99 mg/dL Final    Patient Active Problem List   Diagnosis Date Noted   Primary hypertension 05/24/2023   Chronic ITP (idiopathic thrombocytopenia) (HCC) 05/24/2023   Cervical radiculopathy 02/09/2021   Iron deficiency anemia 03/23/2018   Elevated blood pressure affecting pregnancy in second trimester,  antepartum 01/14/2018   Hemolysis, elevated liver enzymes, and low platelet (HELLP) syndrome during pregnancy, delivered, with postpartum complication 01/14/2018   Unwanted fertility 01/14/2018   Adjustment disorder with anxiety 09/08/2017   Gonorrhea affecting pregnancy in first trimester 08/24/2017   Thrombocytopenia (HCC) 07/12/2011    Past Surgical History:  Procedure Laterality Date   ANTERIOR CERVICAL DECOMP/DISCECTOMY FUSION N/A 02/09/2021   Procedure: C4/5, C5/6 ANTERIOR CERVICAL DECOMPRESSION/DISCECTOMY FUSION;  Surgeon: Lucy Chris, MD;  Location: [facility] ORS;  Service: Neurosurgery;  Laterality: N/A;   CHOLECYSTECTOMY  2012   [facility]  ECTOPIC PREGNANCY SURGERY     FRACTURE SURGERY     RIGHT MIDDLE FINGER    Family History  Problem Relation Age of Onset   Hypertension Mother    Migraines Mother    Hypertension Father    Migraines Father    Migraines Sister    Migraines Brother     Social History   Socioeconomic History   Marital status: Single    Spouse name: Not on file   Number of children: 3   Years of education: GED   Highest education level: Not on file  Occupational History   Not on file  Tobacco Use   Smoking status: Every Day    Current packs/day: 1.00    Average packs/day: 1 pack/day for 4.0 years (4.0 ttl pk-yrs)    Types: Cigarettes    Passive exposure: Current   Smokeless tobacco: Never  Vaping Use   Vaping status: Never Used  Substance and Sexual Activity   Alcohol use: Not Currently    Comment: occ   Drug use: Yes    Types: Marijuana   Sexual activity: Not Currently    Birth control/protection: None  Other Topics Concern   Not on file  Social History Narrative   Works at [employer]   Social Determinants of Health   Financial Resource Strain: Low Risk  (05/24/2023)   Overall Financial Resource Strain (CARDIA)    Difficulty of Paying Living Expenses: Not hard at all  Food Insecurity: No Food Insecurity (05/24/2023)    Hunger Vital Sign    Worried About Running Out of Food in the Last Year: Never true    Ran Out of Food in the Last Year: Never true  Transportation Needs: No Transportation Needs (05/24/2023)   PRAPARE - Administrator, Civil Service (Medical): No    Lack of Transportation (Non-Medical): No  Physical Activity: Sufficiently Active (05/24/2023)   Exercise Vital Sign    Days of Exercise per Week: 5 days    Minutes of Exercise per Session: 60 min  Stress: No Stress Concern Present (05/24/2023)   [employer] of Occupational Health - Occupational Stress Questionnaire    Feeling of Stress : Not at all  Social Connections: Moderately Isolated (05/24/2023)   Social Connection and Isolation Panel [NHANES]    Frequency of Communication with Friends and Family: More than three times a week    Frequency of Social Gatherings with Friends and Family: More than three times a week    Attends Religious Services: More than 4 times per year    Active Member of [employer] or Organizations: No    Attends Banker Meetings: Never    Marital Status: Never married  Intimate Partner Violence: Not At Risk (05/24/2023)   Humiliation, Afraid, Rape, and Kick questionnaire    Fear of Current or Ex-Partner: No    Emotionally Abused: No    Physically Abused: No    Sexually Abused: No     Current Outpatient Medications:    amLODipine (NORVASC) 10 MG tablet, Take 1 tablet (10 mg total) by mouth daily., Disp: 90 tablet, Rfl: 1   hydrochlorothiazide (HYDRODIURIL) 12.5 MG tablet, Take 1 tablet (12.5 mg total) by mouth daily., Disp: 90 tablet, Rfl: 1  Allergies  Allergen Reactions   Doxycycline Swelling    Pt had facial swelling after receiving ms04, zofran, hydrocodone, tylenol, ivp dye, and doxycline. Unclear which was responsible agent.   Ibuprofen Swelling    Facial swelling. Tolerates naproxen.  Iodinated Contrast Media Swelling    Pt had facial swelling after receiving ms04, zofran,  hydrocodone, tylenol, ivp dye, and doxycline. Unclear which was responsible agent.  Pt had facial swelling after receiving ms04, zofran, hydrocodone, tylenol, ivp dye, and doxycline. Unclear which was responsible agent.    Peanut Butter Flavor Anaphylaxis   Peanut-Containing Drug Products Anaphylaxis   Tomato Anaphylaxis   Tylenol [Acetaminophen] Hives     ROS  Constitutional: Negative for fever or weight change.  Respiratory: Negative for cough and shortness of breath.   Cardiovascular: Negative for chest pain or palpitations.  Gastrointestinal: Negative for abdominal pain, no bowel changes.  Musculoskeletal: Negative for gait problem or joint swelling.  Skin: Negative for rash.  Neurological: Negative for dizziness or headache.  No other specific complaints in a complete review of systems (except as listed in HPI above).   Objective  Vitals:   05/24/23 0943  BP: (!) 142/98  Pulse: 89  Resp: 16  Temp: 97.9 F (36.6 C)  TempSrc: Oral  SpO2: 99%  Weight: 153 lb 14.4 oz (69.8 kg)  Height: 5\' 4"  (1.626 m)    Body mass index is 26.42 kg/m.  Physical Exam Constitutional: Patient appears well-developed and well-nourished. No distress.  HENT: Head: Normocephalic and atraumatic. Ears: B TMs ok, no erythema or effusion; Nose: Nose normal. Mouth/Throat: Oropharynx is clear and moist. No oropharyngeal exudate.  Eyes: Conjunctivae and EOM are normal. Pupils are equal, round, and reactive to light. No scleral icterus.  Neck: Normal range of motion. Neck supple. No JVD present. No thyromegaly present.  Cardiovascular: Normal rate, regular rhythm and normal heart sounds.  No murmur heard. No BLE edema. Pulmonary/Chest: Effort normal and breath sounds normal. No respiratory distress. Abdominal: Soft. Bowel sounds are normal, no distension. There is no tenderness. no masses Breast: no lumps or masses, no nipple discharge or rashes FEMALE GENITALIA:  External genitalia normal External  urethra normal Vaginal vault normal with white discharge, no lesions Cervix normal with white discharge, no lesions Bimanual exam normal without masses RECTAL: no rectal masses or hemorrhoids Musculoskeletal: Normal range of motion, no joint effusions. No gross deformities Neurological: he is alert and oriented to person, place, and time. No cranial nerve deficit. Coordination, balance, strength, speech and gait are normal.  Skin: Skin is warm and dry. No rash noted. No erythema.  Psychiatric: Patient has a normal mood and affect. behavior is normal. Judgment and thought content normal.      Fall Risk:    05/24/2023    9:42 AM  Fall Risk   Falls in the past year? 0  Number falls in past yr: 0  Injury with Fall? 0     Functional Status Survey: Is the patient deaf or have difficulty hearing?: No Does the patient have difficulty seeing, even when wearing glasses/contacts?: No Does the patient have difficulty concentrating, remembering, or making decisions?: No Does the patient have difficulty walking or climbing stairs?: No Does the patient have difficulty dressing or bathing?: No Does the patient have difficulty doing errands alone such as visiting a doctor's office or shopping?: No   Assessment & Plan  1. Annual physical exam  - CBC with Differential/Platelet - COMPLETE METABOLIC PANEL WITH GFR - Lipid panel - Hemoglobin A1c - Hepatitis C antibody - HIV Antibody (routine testing w rflx) - Iron, TIBC and Ferritin Panel - Cytology - PAP - RPR - Cervicovaginal ancillary only  2. Primary hypertension amlodipine 10 mg daily and add on hydrochlorothiazide 12.5  mg daily.   - CBC with Differential/Platelet - COMPLETE METABOLIC PANEL WITH GFR - amLODipine (NORVASC) 10 MG tablet; Take 1 tablet (10 mg total) by mouth daily.  Dispense: 90 tablet; Refill: 1 - hydrochlorothiazide (HYDRODIURIL) 12.5 MG tablet; Take 1 tablet (12.5 mg total) by mouth daily.  Dispense: 90 tablet;  Refill: 1  3. Iron deficiency anemia, unspecified iron deficiency anemia type  - CBC with Differential/Platelet - Iron, TIBC and Ferritin Panel  4. Thrombocytopenia (HCC)  - CBC with Differential/Platelet  5. Encounter to establish care   6. Screening for diabetes mellitus  - COMPLETE METABOLIC PANEL WITH GFR - Hemoglobin A1c  7. Screening for deficiency anemia  - CBC with Differential/Platelet  8. Screening for cholesterol level  - Lipid panel  9. Encounter for hepatitis C screening test for low risk patient  - Hepatitis C antibody  10. Screening for HIV without presence of risk factors  - HIV Antibody (routine testing w rflx)  11. Need for Tdap vaccination  - Tdap vaccine greater than or equal to 7yo IM  12. Chronic ITP (idiopathic thrombocytopenia) (HCC)   13. Screening examination for STD (sexually transmitted disease)  - RPR - Cervicovaginal ancillary only  14. Screening for cervical cancer  - Cytology - PAP   15. Cervical radiculopathy Currently taking Excedrin for pain.  She is unsure of the mg so she is going to check and let me know so we can discuss pain management. Patient reports she can not take tylenol or ibuprofen.    -USPSTF grade A and B recommendations reviewed with patient; age-appropriate recommendations, preventive care, screening tests, etc discussed and encouraged; healthy living encouraged; see AVS for patient education given to patient -Discussed importance of 150 minutes of physical activity weekly, eat two servings of fish weekly, eat one serving of tree nuts ( cashews, pistachios, pecans, almonds.Marland Kitchen) every other day, eat 6 servings of fruit/vegetables daily and drink plenty of water and avoid sweet beverages.   -Reviewed Health Maintenance: Yes.

## 2023-05-25 ENCOUNTER — Other Ambulatory Visit: Payer: Self-pay | Admitting: Nurse Practitioner

## 2023-05-25 DIAGNOSIS — B9689 Other specified bacterial agents as the cause of diseases classified elsewhere: Secondary | ICD-10-CM

## 2023-05-25 DIAGNOSIS — B3731 Acute candidiasis of vulva and vagina: Secondary | ICD-10-CM

## 2023-05-25 DIAGNOSIS — A5901 Trichomonal vulvovaginitis: Secondary | ICD-10-CM

## 2023-05-25 LAB — CBC WITH DIFFERENTIAL/PLATELET
Absolute Monocytes: 611 {cells}/uL (ref 200–950)
Basophils Absolute: 39 {cells}/uL (ref 0–200)
Basophils Relative: 0.6 %
Eosinophils Absolute: 98 cells/uL (ref 15–500)
Eosinophils Relative: 1.5 %
HCT: 45.8 % — ABNORMAL HIGH (ref 35.0–45.0)
Hemoglobin: 15 g/dL (ref 11.7–15.5)
Lymphs Abs: 1762 {cells}/uL (ref 850–3900)
MCH: 32.1 pg (ref 27.0–33.0)
MCHC: 32.8 g/dL (ref 32.0–36.0)
MCV: 98.1 fL (ref 80.0–100.0)
MPV: 14.5 fL — ABNORMAL HIGH (ref 7.5–12.5)
Monocytes Relative: 9.4 %
Neutro Abs: 3991 {cells}/uL (ref 1500–7800)
Neutrophils Relative %: 61.4 %
Platelets: 136 10*3/uL — ABNORMAL LOW (ref 140–400)
RBC: 4.67 10*6/uL (ref 3.80–5.10)
RDW: 11.9 % (ref 11.0–15.0)
Total Lymphocyte: 27.1 %
WBC: 6.5 10*3/uL (ref 3.8–10.8)

## 2023-05-25 LAB — CERVICOVAGINAL ANCILLARY ONLY
Bacterial Vaginitis (gardnerella): POSITIVE — AB
Candida Glabrata: POSITIVE — AB
Candida Vaginitis: NEGATIVE
Chlamydia: NEGATIVE
Comment: NEGATIVE
Comment: NEGATIVE
Comment: NEGATIVE
Comment: NEGATIVE
Comment: NEGATIVE
Comment: NORMAL
Neisseria Gonorrhea: NEGATIVE
Trichomonas: POSITIVE — AB

## 2023-05-25 LAB — COMPLETE METABOLIC PANEL WITH GFR
AG Ratio: 1.5 (calc) (ref 1.0–2.5)
ALT: 10 U/L (ref 6–29)
AST: 17 U/L (ref 10–30)
Albumin: 4.2 g/dL (ref 3.6–5.1)
Alkaline phosphatase (APISO): 48 U/L (ref 31–125)
BUN: 14 mg/dL (ref 7–25)
CO2: 25 mmol/L (ref 20–32)
Calcium: 9.8 mg/dL (ref 8.6–10.2)
Chloride: 105 mmol/L (ref 98–110)
Creat: 0.9 mg/dL (ref 0.50–0.97)
Globulin: 2.8 g/dL (ref 1.9–3.7)
Glucose, Bld: 86 mg/dL (ref 65–99)
Potassium: 4.9 mmol/L (ref 3.5–5.3)
Sodium: 138 mmol/L (ref 135–146)
Total Bilirubin: 1 mg/dL (ref 0.2–1.2)
Total Protein: 7 g/dL (ref 6.1–8.1)
eGFR: 88 mL/min/{1.73_m2} (ref >=60)

## 2023-05-25 LAB — IRON,TIBC AND FERRITIN PANEL
%SAT: 23 % (ref 16–45)
Ferritin: 10 ng/mL — ABNORMAL LOW (ref 16–154)
Iron: 84 ug/dL (ref 40–190)
TIBC: 373 ug/dL (ref 250–450)

## 2023-05-25 LAB — LIPID PANEL
Cholesterol: 125 mg/dL (ref <200)
HDL: 58 mg/dL (ref >=50)
LDL Cholesterol (Calc): 57 mg/dL
Non-HDL Cholesterol (Calc): 67 mg/dL (ref <130)
Total CHOL/HDL Ratio: 2.2 (calc) (ref <5.0)
Triglycerides: 36 mg/dL (ref <150)

## 2023-05-25 LAB — CYTOLOGY - PAP
Adequacy: ABSENT
Chlamydia: NEGATIVE
Comment: NEGATIVE
Comment: NEGATIVE
Comment: NORMAL
Diagnosis: NEGATIVE
High risk HPV: NEGATIVE
Neisseria Gonorrhea: NEGATIVE

## 2023-05-25 LAB — HEMOGLOBIN A1C
Hgb A1c MFr Bld: 5.3 %{Hb} (ref <5.7)
Mean Plasma Glucose: 105 mg/dL
eAG (mmol/L): 5.8 mmol/L

## 2023-05-25 LAB — HIV ANTIBODY (ROUTINE TESTING W REFLEX): HIV 1&2 Ab, 4th Generation: NONREACTIVE

## 2023-05-25 LAB — RPR: RPR Ser Ql: NONREACTIVE

## 2023-05-25 LAB — HEPATITIS C ANTIBODY: Hepatitis C Ab: NONREACTIVE

## 2023-05-25 MED ORDER — FLUCONAZOLE 150 MG PO TABS: 150.0000 mg | ORAL_TABLET | ORAL | 0 refills | Status: DC | PRN

## 2023-05-25 MED ORDER — METRONIDAZOLE 500 MG PO TABS: 1000.0000 mg | ORAL_TABLET | Freq: Two times a day (BID) | ORAL | 0 refills | Status: AC

## 2023-05-26 ENCOUNTER — Ambulatory Visit: Payer: Self-pay | Admitting: *Deleted

## 2023-05-26 ENCOUNTER — Encounter: Payer: Self-pay | Admitting: Oncology

## 2023-05-26 NOTE — Telephone Encounter (Signed)
  Chief Complaint: Pt called in and was given her lab result message from Della Goo, FNP dated 05/25/2023 at 2:58 PM and 3:42 PM.    No questions.     Symptoms: N/A Frequency: N/A Pertinent Negatives: Patient denies N/A Disposition: [] ED /[] Urgent Care (no appt availability in office) / [] Appointment(In office/virtual)/ []  [facility]/ [x] Home Care/ [] Refused Recommended Disposition /[] [facility]/ []  Follow-up with PCP Additional Notes: Lab results given.   No questions from pt.

## 2023-05-26 NOTE — Telephone Encounter (Signed)
Thank you :)

## 2023-05-26 NOTE — Telephone Encounter (Signed)
Reason for Disposition  [1] Follow-up call to recent contact AND [2] information only call, no triage required  Answer Assessment - Initial Assessment Questions 1. REASON FOR CALL or QUESTION: "What is your reason for calling today?" or "How can I best help you?" or "What question do you have that I can help answer?"     Pt calling in for her lab results.   Message given from Della Goo, FNP  Protocols used: Information Only Call - No Triage-A-AH

## 2023-06-03 ENCOUNTER — Encounter: Payer: Self-pay | Admitting: Nurse Practitioner

## 2023-06-06 NOTE — Progress Notes (Unsigned)
LMP 05/11/2023    Subjective:    Patient ID: Dominique Shea, female    DOB: [birth year], 32 y.o.   MRN: [id number]  HPI: Dominique Shea is a 32 y.o. female  No chief complaint on file.  Cervical radiculopathy: patient reports she has previously had neck surgery. Had a spinal fusion back in 2022.   She says she continues to have pain and has been taking Excedrin. Notes from neurosurgery:  Ms. Azlin is here 4 and 1/2 months out from her ACDF. She appears to be doing very well but has had a recent exacerbation of acute neck pain. I do not see anything concerning on her x-rays today. I do think this is more musculoskeletal in nature. I encouraged her to do neck range of motion exercises and use topical agents such as the lidocaine patches. If she does develop any more severe or new symptoms in the future, I would recommend repeat x-rays at that time. Otherwise she is cleared to continue with return to full activity  Patient here today c/o increased pain.  ***   Relevant past medical, surgical, family and social history reviewed and updated as indicated. Interim medical history since our last visit reviewed. Allergies and medications reviewed and updated.  Review of Systems  Ten systems reviewed and is negative except as mentioned in HPI       Objective:    LMP 05/11/2023   Wt Readings from Last 3 Encounters:  05/24/23 153 lb 14.4 oz (69.8 kg)  04/19/23 154 lb (69.9 kg)  12/26/21 145 lb (65.8 kg)    Physical Exam Constitutional: Patient appears well-developed and well-nourished. Obese *** No distress.  HEENT: head atraumatic, normocephalic, pupils equal and reactive to light, ears ***, neck supple, throat within normal limits Cardiovascular: Normal rate, regular rhythm and normal heart sounds.  No murmur heard. No BLE edema. Pulmonary/Chest: Effort normal and breath sounds normal. No respiratory distress. Abdominal: Soft.  There is no tenderness. Psychiatric:  Patient has a normal mood and affect. behavior is normal. Judgment and thought content normal.   Results for orders placed or performed in visit on 05/24/23  CBC with Differential/Platelet  Result Value Ref Range   WBC 6.5 3.8 - 10.8 Thousand/uL   RBC 4.67 3.80 - 5.10 Million/uL   Hemoglobin 15.0 11.7 - 15.5 g/dL   HCT 40.9 (H) 81.1 - 91.4 %   MCV 98.1 80.0 - 100.0 fL   MCH 32.1 27.0 - 33.0 pg   MCHC 32.8 32.0 - 36.0 g/dL   RDW 78.2 95.6 - 21.3 %   Platelets 136 (L) 140 - 400 Thousand/uL   MPV 14.5 (H) 7.5 - 12.5 fL   Neutro Abs 3,991 1,500 - 7,800 cells/uL   Lymphs Abs 1,762 850 - 3,900 cells/uL   Absolute Monocytes 611 200 - 950 cells/uL   Eosinophils Absolute 98 15 - 500 cells/uL   Basophils Absolute 39 0 - 200 cells/uL   Neutrophils Relative % 61.4 %   Total Lymphocyte 27.1 %   Monocytes Relative 9.4 %   Eosinophils Relative 1.5 %   Basophils Relative 0.6 %  COMPLETE METABOLIC PANEL WITH GFR  Result Value Ref Range   Glucose, Bld 86 65 - 99 mg/dL   BUN 14 7 - 25 mg/dL   Creat 0.86 5.78 - 4.69 mg/dL   eGFR 88 > OR = 60 GE/XBM/8.41L2   BUN/Creatinine Ratio SEE NOTE: 6 - 22 (calc)   Sodium 138 135 - 146 mmol/L  Potassium 4.9 3.5 - 5.3 mmol/L   Chloride 105 98 - 110 mmol/L   CO2 25 20 - 32 mmol/L   Calcium 9.8 8.6 - 10.2 mg/dL   Total Protein 7.0 6.1 - 8.1 g/dL   Albumin 4.2 3.6 - 5.1 g/dL   Globulin 2.8 1.9 - 3.7 g/dL (calc)   AG Ratio 1.5 1.0 - 2.5 (calc)   Total Bilirubin 1.0 0.2 - 1.2 mg/dL   Alkaline phosphatase (APISO) 48 31 - 125 U/L   AST 17 10 - 30 U/L   ALT 10 6 - 29 U/L  Lipid panel  Result Value Ref Range   Cholesterol 125 <200 mg/dL   HDL 58 > OR = 50 mg/dL   Triglycerides 36 <102 mg/dL   LDL Cholesterol (Calc) 57 mg/dL (calc)   Total CHOL/HDL Ratio 2.2 <5.0 (calc)   Non-HDL Cholesterol (Calc) 67 <725 mg/dL (calc)  Hemoglobin D6U  Result Value Ref Range   Hgb A1c MFr Bld 5.3 <5.7 % of total Hgb   Mean Plasma Glucose 105 mg/dL   eAG (mmol/L) 5.8  mmol/L  Hepatitis C antibody  Result Value Ref Range   Hepatitis C Ab NON-REACTIVE NON-REACTIVE  HIV Antibody (routine testing w rflx)  Result Value Ref Range   HIV 1&2 Ab, 4th Generation NON-REACTIVE NON-REACTIVE  Iron, TIBC and Ferritin Panel  Result Value Ref Range   Iron 84 40 - 190 mcg/dL   TIBC 440 347 - 425 mcg/dL (calc)   %SAT 23 16 - 45 % (calc)   Ferritin 10 (L) 16 - 154 ng/mL  RPR  Result Value Ref Range   RPR Ser Ql NON-REACTIVE NON-REACTIVE  Cytology - PAP  Result Value Ref Range   High risk HPV Negative    Neisseria Gonorrhea Negative    Chlamydia Negative    Adequacy      Satisfactory for evaluation; transformation zone component ABSENT.   Diagnosis      - Negative for intraepithelial lesion or malignancy (NILM)   Microorganisms Shift in flora suggestive of bacterial vaginosis    Comment Normal Reference Range HPV - Negative    Comment Normal Reference Ranger Chlamydia - Negative    Comment      Normal Reference Range Neisseria Gonorrhea - Negative  Cervicovaginal ancillary only  Result Value Ref Range   Neisseria Gonorrhea Negative    Chlamydia Negative    Trichomonas Positive (A)    Bacterial Vaginitis (gardnerella) Positive (A)    Candida Vaginitis Negative    Candida Glabrata Positive (A)    Comment Normal Reference Range Candida Species - Negative    Comment Normal Reference Range Candida Galbrata - Negative    Comment Normal Reference Range Trichomonas - Negative    Comment Normal Reference Ranger Chlamydia - Negative    Comment      Normal Reference Range Neisseria Gonorrhea - Negative   Comment      Normal Reference Range Bacterial Vaginosis - Negative      Assessment & Plan:   Problem List Items Addressed This Visit   None    Follow up plan: No follow-ups on file.

## 2023-06-07 ENCOUNTER — Encounter: Payer: Self-pay | Admitting: Nurse Practitioner

## 2023-06-07 ENCOUNTER — Ambulatory Visit (INDEPENDENT_AMBULATORY_CARE_PROVIDER_SITE_OTHER): Payer: 59 | Admitting: Nurse Practitioner

## 2023-06-07 ENCOUNTER — Other Ambulatory Visit: Payer: Self-pay

## 2023-06-07 ENCOUNTER — Ambulatory Visit
Admission: RE | Admit: 2023-06-07 | Discharge: 2023-06-07 | Disposition: A | Discharge status: Home / Self Care | Payer: 59 | Source: Ambulatory Visit | Attending: Nurse Practitioner | Admitting: Nurse Practitioner

## 2023-06-07 ENCOUNTER — Ambulatory Visit
Admission: RE | Admit: 2023-06-07 | Discharge: 2023-06-07 | Disposition: A | Discharge status: Home / Self Care | Payer: 59 | Attending: Nurse Practitioner | Admitting: Nurse Practitioner

## 2023-06-07 VITALS — BP 118/72 | HR 100 | Temp 97.9°F | Resp 16 | Ht 64.0 in | Wt 148.6 lb

## 2023-06-07 DIAGNOSIS — Z981 Arthrodesis status: Secondary | ICD-10-CM | POA: Diagnosis not present

## 2023-06-07 DIAGNOSIS — M5412 Radiculopathy, cervical region: Secondary | ICD-10-CM

## 2023-06-07 DIAGNOSIS — M542 Cervicalgia: Secondary | ICD-10-CM | POA: Diagnosis not present

## 2023-06-07 DIAGNOSIS — J069 Acute upper respiratory infection, unspecified: Secondary | ICD-10-CM

## 2023-06-07 MED ORDER — PREDNISONE 10 MG (21) PO TBPK: ORAL_TABLET | ORAL | 0 refills | Status: DC

## 2023-06-07 MED ORDER — BENZONATATE 100 MG PO CAPS: 100.0000 mg | ORAL_CAPSULE | Freq: Two times a day (BID) | ORAL | 0 refills | Status: DC | PRN

## 2023-06-07 MED ORDER — PROMETHAZINE-DM 6.25-15 MG/5ML PO SYRP: 5.0000 mL | ORAL_SOLUTION | Freq: Four times a day (QID) | ORAL | 0 refills | Status: DC | PRN

## 2023-06-07 NOTE — Assessment & Plan Note (Addendum)
Worsening pain,  will get c-spine xray start steroid taper, may need to go back to neurosurgery

## 2023-06-21 ENCOUNTER — Ambulatory Visit: Payer: Self-pay | Admitting: Nurse Practitioner

## 2023-06-21 ENCOUNTER — Ambulatory Visit: Payer: 59 | Admitting: Nurse Practitioner

## 2023-06-29 ENCOUNTER — Ambulatory Visit: Payer: 59 | Admitting: Nurse Practitioner

## 2023-06-29 NOTE — Progress Notes (Deleted)
There were no vitals taken for this visit.   Subjective:    Patient ID: Dominique Shea, female    DOB: June 28, 1991, 32 y.o.   MRN: [id number]  HPI: Dominique Shea is a 32 y.o. female  No chief complaint on file.  Hypertension:  -Medications: amlodipine 10 mg daily, hydrochlorothiazide 12.5 mg daily -Patient is compliant with above medications and reports no side effects. -Checking BP at home (average): *** -Highest BP at home: *** -Lowest BP at home: *** -Denies any SOB, CP, vision changes, LE edema or symptoms of hypotension -Diet: recommend DASH diet  -Exercise: recommend 150 min of physical activity weekly     Relevant past medical, surgical, family and social history reviewed and updated as indicated. Interim medical history since our last visit reviewed. Allergies and medications reviewed and updated.  Review of Systems  Constitutional: Negative for fever or weight change.  Respiratory: Negative for cough and shortness of breath.   Cardiovascular: Negative for chest pain or palpitations.  Gastrointestinal: Negative for abdominal pain, no bowel changes.  Musculoskeletal: Negative for gait problem or joint swelling.  Skin: Negative for rash.  Neurological: Negative for dizziness or headache.  No other specific complaints in a complete review of systems (except as listed in HPI above).      Objective:    There were no vitals taken for this visit.  Wt Readings from Last 3 Encounters:  06/07/23 148 lb 9.6 oz (67.4 kg)  05/24/23 153 lb 14.4 oz (69.8 kg)  04/19/23 154 lb (69.9 kg)    Physical Exam  Constitutional: Patient appears well-developed and well-nourished. Obese *** No distress.  HEENT: head atraumatic, normocephalic, pupils equal and reactive to light, ears ***, neck supple, throat within normal limits Cardiovascular: Normal rate, regular rhythm and normal heart sounds.  No murmur heard. No BLE edema. Pulmonary/Chest: Effort normal and breath  sounds normal. No respiratory distress. Abdominal: Soft.  There is no tenderness. Psychiatric: Patient has a normal mood and affect. behavior is normal. Judgment and thought content normal.  Results for orders placed or performed in visit on 05/24/23  CBC with Differential/Platelet  Result Value Ref Range   WBC 6.5 3.8 - 10.8 Thousand/uL   RBC 4.67 3.80 - 5.10 Million/uL   Hemoglobin 15.0 11.7 - 15.5 g/dL   HCT 18.8 (H) 41.6 - 60.6 %   MCV 98.1 80.0 - 100.0 fL   MCH 32.1 27.0 - 33.0 pg   MCHC 32.8 32.0 - 36.0 g/dL   RDW 30.1 60.1 - 09.3 %   Platelets 136 (L) 140 - 400 Thousand/uL   MPV 14.5 (H) 7.5 - 12.5 fL   Neutro Abs 3,991 1,500 - 7,800 cells/uL   Lymphs Abs 1,762 850 - 3,900 cells/uL   Absolute Monocytes 611 200 - 950 cells/uL   Eosinophils Absolute 98 15 - 500 cells/uL   Basophils Absolute 39 0 - 200 cells/uL   Neutrophils Relative % 61.4 %   Total Lymphocyte 27.1 %   Monocytes Relative 9.4 %   Eosinophils Relative 1.5 %   Basophils Relative 0.6 %  COMPLETE METABOLIC PANEL WITH GFR  Result Value Ref Range   Glucose, Bld 86 65 - 99 mg/dL   BUN 14 7 - 25 mg/dL   Creat 2.35 5.73 - 2.20 mg/dL   eGFR 88 > OR = 60 UR/KYH/0.62B7   BUN/Creatinine Ratio SEE NOTE: 6 - 22 (calc)   Sodium 138 135 - 146 mmol/L   Potassium 4.9 3.5 - 5.3  mmol/L   Chloride 105 98 - 110 mmol/L   CO2 25 20 - 32 mmol/L   Calcium 9.8 8.6 - 10.2 mg/dL   Total Protein 7.0 6.1 - 8.1 g/dL   Albumin 4.2 3.6 - 5.1 g/dL   Globulin 2.8 1.9 - 3.7 g/dL (calc)   AG Ratio 1.5 1.0 - 2.5 (calc)   Total Bilirubin 1.0 0.2 - 1.2 mg/dL   Alkaline phosphatase (APISO) 48 31 - 125 U/L   AST 17 10 - 30 U/L   ALT 10 6 - 29 U/L  Lipid panel  Result Value Ref Range   Cholesterol 125 <200 mg/dL   HDL 58 > OR = 50 mg/dL   Triglycerides 36 <102 mg/dL   LDL Cholesterol (Calc) 57 mg/dL (calc)   Total CHOL/HDL Ratio 2.2 <5.0 (calc)   Non-HDL Cholesterol (Calc) 67 <725 mg/dL (calc)  Hemoglobin D6U  Result Value Ref Range    Hgb A1c MFr Bld 5.3 <5.7 % of total Hgb   Mean Plasma Glucose 105 mg/dL   eAG (mmol/L) 5.8 mmol/L  Hepatitis C antibody  Result Value Ref Range   Hepatitis C Ab NON-REACTIVE NON-REACTIVE  HIV Antibody (routine testing w rflx)  Result Value Ref Range   HIV 1&2 Ab, 4th Generation NON-REACTIVE NON-REACTIVE  Iron, TIBC and Ferritin Panel  Result Value Ref Range   Iron 84 40 - 190 mcg/dL   TIBC 440 347 - 425 mcg/dL (calc)   %SAT 23 16 - 45 % (calc)   Ferritin 10 (L) 16 - 154 ng/mL  RPR  Result Value Ref Range   RPR Ser Ql NON-REACTIVE NON-REACTIVE  Cytology - PAP  Result Value Ref Range   High risk HPV Negative    Neisseria Gonorrhea Negative    Chlamydia Negative    Adequacy      Satisfactory for evaluation; transformation zone component ABSENT.   Diagnosis      - Negative for intraepithelial lesion or malignancy (NILM)   Microorganisms Shift in flora suggestive of bacterial vaginosis    Comment Normal Reference Range HPV - Negative    Comment Normal Reference Ranger Chlamydia - Negative    Comment      Normal Reference Range Neisseria Gonorrhea - Negative  Cervicovaginal ancillary only  Result Value Ref Range   Neisseria Gonorrhea Negative    Chlamydia Negative    Trichomonas Positive (A)    Bacterial Vaginitis (gardnerella) Positive (A)    Candida Vaginitis Negative    Candida Glabrata Positive (A)    Comment Normal Reference Range Candida Species - Negative    Comment Normal Reference Range Candida Galbrata - Negative    Comment Normal Reference Range Trichomonas - Negative    Comment Normal Reference Ranger Chlamydia - Negative    Comment      Normal Reference Range Neisseria Gonorrhea - Negative   Comment      Normal Reference Range Bacterial Vaginosis - Negative      Assessment & Plan:   Problem List Items Addressed This Visit   None    Follow up plan: No follow-ups on file.

## 2023-06-30 ENCOUNTER — Encounter: Payer: Self-pay | Admitting: Nurse Practitioner

## 2023-07-21 ENCOUNTER — Encounter: Payer: Self-pay | Admitting: Nurse Practitioner

## 2024-03-23 ENCOUNTER — Encounter: Payer: Self-pay | Admitting: Oncology

## 2024-03-23 ENCOUNTER — Inpatient Hospital Stay
Admission: EM | Admit: 2024-03-23 | Discharge: 2024-03-26 | DRG: 690 | Disposition: A | Discharge status: Home / Self Care | Attending: Family Medicine | Admitting: Family Medicine

## 2024-03-23 ENCOUNTER — Other Ambulatory Visit: Payer: Self-pay

## 2024-03-23 ENCOUNTER — Emergency Department

## 2024-03-23 ENCOUNTER — Encounter: Payer: Self-pay | Admitting: Intensive Care

## 2024-03-23 DIAGNOSIS — I10 Essential (primary) hypertension: Secondary | ICD-10-CM | POA: Diagnosis present

## 2024-03-23 DIAGNOSIS — Z79899 Other long term (current) drug therapy: Secondary | ICD-10-CM

## 2024-03-23 DIAGNOSIS — Z981 Arthrodesis status: Secondary | ICD-10-CM

## 2024-03-23 DIAGNOSIS — N760 Acute vaginitis: Secondary | ICD-10-CM | POA: Diagnosis not present

## 2024-03-23 DIAGNOSIS — D649 Anemia, unspecified: Secondary | ICD-10-CM | POA: Diagnosis not present

## 2024-03-23 DIAGNOSIS — F419 Anxiety disorder, unspecified: Secondary | ICD-10-CM | POA: Diagnosis not present

## 2024-03-23 DIAGNOSIS — N10 Acute tubulo-interstitial nephritis: Secondary | ICD-10-CM | POA: Diagnosis not present

## 2024-03-23 DIAGNOSIS — Z87891 Personal history of nicotine dependence: Secondary | ICD-10-CM

## 2024-03-23 DIAGNOSIS — Z8249 Family history of ischemic heart disease and other diseases of the circulatory system: Secondary | ICD-10-CM | POA: Diagnosis not present

## 2024-03-23 DIAGNOSIS — Z91018 Allergy to other foods: Secondary | ICD-10-CM

## 2024-03-23 DIAGNOSIS — N390 Urinary tract infection, site not specified: Secondary | ICD-10-CM | POA: Diagnosis not present

## 2024-03-23 DIAGNOSIS — F10139 Alcohol abuse with withdrawal, unspecified: Secondary | ICD-10-CM | POA: Diagnosis not present

## 2024-03-23 DIAGNOSIS — N12 Tubulo-interstitial nephritis, not specified as acute or chronic: Principal | ICD-10-CM

## 2024-03-23 DIAGNOSIS — R519 Headache, unspecified: Secondary | ICD-10-CM | POA: Diagnosis not present

## 2024-03-23 DIAGNOSIS — D696 Thrombocytopenia, unspecified: Secondary | ICD-10-CM | POA: Diagnosis not present

## 2024-03-23 DIAGNOSIS — Z91041 Radiographic dye allergy status: Secondary | ICD-10-CM | POA: Diagnosis not present

## 2024-03-23 DIAGNOSIS — I160 Hypertensive urgency: Secondary | ICD-10-CM | POA: Diagnosis present

## 2024-03-23 DIAGNOSIS — R251 Tremor, unspecified: Secondary | ICD-10-CM | POA: Diagnosis not present

## 2024-03-23 DIAGNOSIS — Z881 Allergy status to other antibiotic agents status: Secondary | ICD-10-CM

## 2024-03-23 DIAGNOSIS — R7989 Other specified abnormal findings of blood chemistry: Secondary | ICD-10-CM | POA: Diagnosis not present

## 2024-03-23 DIAGNOSIS — Z9049 Acquired absence of other specified parts of digestive tract: Secondary | ICD-10-CM | POA: Diagnosis not present

## 2024-03-23 DIAGNOSIS — R112 Nausea with vomiting, unspecified: Secondary | ICD-10-CM | POA: Diagnosis not present

## 2024-03-23 DIAGNOSIS — Z888 Allergy status to other drugs, medicaments and biological substances status: Secondary | ICD-10-CM

## 2024-03-23 DIAGNOSIS — B9689 Other specified bacterial agents as the cause of diseases classified elsewhere: Secondary | ICD-10-CM

## 2024-03-23 LAB — URINALYSIS, ROUTINE W REFLEX MICROSCOPIC: WBC, UA: >50 WBC/hpf (ref 0–5)

## 2024-03-23 LAB — CBC
HCT: 40.9 % (ref 36.0–46.0)
Hemoglobin: 13.9 g/dL (ref 12.0–15.0)
MCH: 32.3 pg (ref 26.0–34.0)
MCHC: 34 g/dL (ref 30.0–36.0)
MCV: 95.1 fL (ref 80.0–100.0)
Platelets: 128 10*3/uL — ABNORMAL LOW (ref 150–400)
RBC: 4.3 MIL/uL (ref 3.87–5.11)
RDW: 13.5 % (ref 11.5–15.5)
WBC: 13.3 10*3/uL — ABNORMAL HIGH (ref 4.0–10.5)
nRBC: 0 % (ref 0.0–0.2)

## 2024-03-23 LAB — WET PREP, GENITAL
Sperm: NONE SEEN
Trich, Wet Prep: NONE SEEN
WBC, Wet Prep HPF POC: >=10 — AB (ref <10)
Yeast Wet Prep HPF POC: NONE SEEN

## 2024-03-23 LAB — HEPATIC FUNCTION PANEL
ALT: 13 U/L (ref 0–44)
AST: 27 U/L (ref 15–41)
Albumin: 3.9 g/dL (ref 3.5–5.0)
Alkaline Phosphatase: 41 U/L (ref 38–126)
Bilirubin, Direct: 0.1 mg/dL (ref 0.0–0.2)
Indirect Bilirubin: 1.1 mg/dL — ABNORMAL HIGH (ref 0.3–0.9)
Total Bilirubin: 1.2 mg/dL (ref 0.0–1.2)
Total Protein: 6.6 g/dL (ref 6.5–8.1)

## 2024-03-23 LAB — BASIC METABOLIC PANEL WITH GFR
Anion gap: 8 (ref 5–15)
BUN: 13 mg/dL (ref 6–20)
CO2: 23 mmol/L (ref 22–32)
Calcium: 8.8 mg/dL — ABNORMAL LOW (ref 8.9–10.3)
Chloride: 108 mmol/L (ref 98–111)
Creatinine, Ser: 0.97 mg/dL (ref 0.44–1.00)
GFR, Estimated: >60 mL/min (ref >60)
Glucose, Bld: 110 mg/dL — ABNORMAL HIGH (ref 70–99)
Potassium: 3.5 mmol/L (ref 3.5–5.1)
Sodium: 139 mmol/L (ref 135–145)

## 2024-03-23 LAB — CHLAMYDIA/NGC RT PCR (ARMC ONLY)
Chlamydia Tr: NOT DETECTED
N gonorrhoeae: NOT DETECTED

## 2024-03-23 LAB — POC URINE PREG, ED: Preg Test, Ur: NEGATIVE

## 2024-03-23 LAB — LIPASE, BLOOD: Lipase: 30 U/L (ref 11–51)

## 2024-03-23 MED ORDER — KETOROLAC TROMETHAMINE 15 MG/ML IJ SOLN
15.0000 mg | Freq: Once | INTRAMUSCULAR | Status: AC
Start: 2024-03-23 — End: 2024-03-23
  Administered 2024-03-23: 15 mg via INTRAVENOUS
  Filled 2024-03-23: qty 1

## 2024-03-23 MED ORDER — MORPHINE SULFATE (PF) 4 MG/ML IV SOLN
4.0000 mg | Freq: Once | INTRAVENOUS | Status: AC
  Administered 2024-03-23: 4 mg via INTRAVENOUS
  Filled 2024-03-23: qty 1

## 2024-03-23 MED ORDER — FENTANYL CITRATE PF 50 MCG/ML IJ SOSY
50.0000 ug | PREFILLED_SYRINGE | Freq: Once | INTRAMUSCULAR | Status: AC
  Administered 2024-03-23: 50 ug via INTRAVENOUS
  Filled 2024-03-23: qty 1

## 2024-03-23 MED ORDER — OXYCODONE HCL 5 MG PO TABS
5.0000 mg | ORAL_TABLET | ORAL | Status: DC | PRN
  Administered 2024-03-23 – 2024-03-26 (×7): 5 mg via ORAL
  Filled 2024-03-23 (×7): qty 1

## 2024-03-23 MED ORDER — ONDANSETRON HCL 4 MG PO TABS: 4.0000 mg | ORAL_TABLET | Freq: Four times a day (QID) | ORAL | Status: DC | PRN

## 2024-03-23 MED ORDER — ALBUTEROL SULFATE (2.5 MG/3ML) 0.083% IN NEBU: 2.5000 mg | INHALATION_SOLUTION | RESPIRATORY_TRACT | Status: DC | PRN

## 2024-03-23 MED ORDER — AMLODIPINE BESYLATE 10 MG PO TABS
10.0000 mg | ORAL_TABLET | Freq: Every day | ORAL | Status: DC
  Administered 2024-03-23 – 2024-03-26 (×4): 10 mg via ORAL
  Filled 2024-03-23: qty 1
  Filled 2024-03-23: qty 2
  Filled 2024-03-23 (×2): qty 1

## 2024-03-23 MED ORDER — CEFTRIAXONE SODIUM 1 G IJ SOLR
1.0000 g | INTRAMUSCULAR | Status: DC
  Administered 2024-03-23: 1 g via INTRAVENOUS
  Filled 2024-03-23: qty 10

## 2024-03-23 MED ORDER — FENTANYL CITRATE PF 50 MCG/ML IJ SOSY
12.5000 ug | PREFILLED_SYRINGE | INTRAMUSCULAR | Status: DC | PRN
  Administered 2024-03-23 – 2024-03-24 (×3): 12.5 ug via INTRAVENOUS
  Filled 2024-03-23 (×3): qty 1

## 2024-03-23 MED ORDER — LABETALOL HCL 5 MG/ML IV SOLN
5.0000 mg | Freq: Once | INTRAVENOUS | Status: AC
  Administered 2024-03-23: 5 mg via INTRAVENOUS
  Filled 2024-03-23: qty 4

## 2024-03-23 MED ORDER — SODIUM CHLORIDE 0.9 % IV SOLN
2.0000 g | INTRAVENOUS | Status: DC
  Administered 2024-03-24 – 2024-03-26 (×3): 2 g via INTRAVENOUS
  Filled 2024-03-23 (×5): qty 20

## 2024-03-23 MED ORDER — METRONIDAZOLE 500 MG/100ML IV SOLN
500.0000 mg | Freq: Two times a day (BID) | INTRAVENOUS | Status: DC
  Administered 2024-03-23 – 2024-03-25 (×5): 500 mg via INTRAVENOUS
  Filled 2024-03-23 (×5): qty 100

## 2024-03-23 MED ORDER — LABETALOL HCL 5 MG/ML IV SOLN
10.0000 mg | INTRAVENOUS | Status: DC | PRN
  Administered 2024-03-24 – 2024-03-25 (×3): 10 mg via INTRAVENOUS
  Filled 2024-03-23 (×3): qty 4

## 2024-03-23 MED ORDER — ONDANSETRON HCL 4 MG/2ML IJ SOLN
4.0000 mg | Freq: Four times a day (QID) | INTRAMUSCULAR | Status: DC | PRN
  Administered 2024-03-25: 4 mg via INTRAVENOUS
  Filled 2024-03-23: qty 2

## 2024-03-23 MED ORDER — TRAMADOL HCL 50 MG PO TABS
50.0000 mg | ORAL_TABLET | Freq: Three times a day (TID) | ORAL | Status: DC | PRN
  Administered 2024-03-23 – 2024-03-25 (×4): 50 mg via ORAL
  Filled 2024-03-23 (×4): qty 1

## 2024-03-23 NOTE — ED Triage Notes (Signed)
 Patient c/o left flank pain that started yesterday and has progressively gotten worse. Reports urine is orange in color. Reports N/V  Denies diarrhea  Denies pain with urination   Has not taken blood pressure medication in over a year

## 2024-03-23 NOTE — Plan of Care (Signed)
   Problem: Education: Goal: Knowledge of General Education information will improve Description Including pain rating scale, medication(s)/side effects and non-pharmacologic comfort measures Outcome: Progressing

## 2024-03-23 NOTE — H&P (Signed)
 History and Physical    Dominique Shea FMW:[id number] DOB: 09-22-1991 DOA: 03/23/2024  PCP: Gareth Mliss FALCON, FNP  Patient coming from: home  I have personally briefly reviewed patient's old medical records in [facility]  Chief Complaint: flank pain   HPI: Dominique Shea is a 33 y.o. female with medical history significant of Anemia, Anxiety , Thrombocytopenia who presents to ED with  Left flank pain  x 24 hours associated with n/v and lower abdominal pain and discoloration urine as well as chills but no fever. Patient currently notes continued flank pain.But notes n/v improved.   ED Course:  In ED patient was evaluated and found to have  Afeb, bp 195/127, hr 82, rr 18, sat 100% on ra  Wbc 13.3, plt 129 Na 139, K 3.5, cl 108, glu 110, cr 0.97 UA- wbc >50, +bacteria , rbc >11-20, urine orange Wet prep + - clue cells GC chlamydia neg Tx ketoralac,morphine ,labetalol ,fentanyl ,CTX1 gram Review of Systems: As per HPI otherwise 10 point review of systems negative.   Past Medical History:  Diagnosis Date   Abnormal Pap smear of cervix 2014   HPV +   Anemia    Anxiety    HELLP (hemolytic anemia/elev liver enzymes/low platelets in pregnancy)    History of gonorrhea 2014   Migraine    Thrombocytopenia (HCC)    DURING FIRST PREG    Past Surgical History:  Procedure Laterality Date   ANTERIOR CERVICAL DECOMP/DISCECTOMY FUSION N/A 02/09/2021   Procedure: C4/5, C5/6 ANTERIOR CERVICAL DECOMPRESSION/DISCECTOMY FUSION;  Surgeon: Bluford Standing, MD;  Location: [facility] ORS;  Service: Neurosurgery;  Laterality: N/A;   CHOLECYSTECTOMY  2012   [facility]   ECTOPIC PREGNANCY SURGERY     FRACTURE SURGERY     RIGHT MIDDLE FINGER     reports that she has been smoking cigarettes. She has a 4 pack-year smoking history. She has been exposed to tobacco smoke. She has never used smokeless tobacco. She reports current alcohol use of about 12.0 standard drinks of alcohol per week. She reports  current drug use. Drug: Marijuana.  Allergies  Allergen Reactions   Doxycycline  Swelling    Pt had facial swelling after receiving ms04, zofran , hydrocodone , tylenol , ivp dye, and doxycline. Unclear which was responsible agent.   Ibuprofen  Swelling    Facial swelling. Tolerates naproxen .   Iodinated Contrast Media Swelling    Pt had facial swelling after receiving ms04, zofran , hydrocodone , tylenol , ivp dye, and doxycline. Unclear which was responsible agent.  Pt had facial swelling after receiving ms04, zofran , hydrocodone , tylenol , ivp dye, and doxycline. Unclear which was responsible agent.    Peanut Butter Flavoring Agent (Non-Screening) Anaphylaxis   Peanut-Containing Drug Products Anaphylaxis   Tomato Anaphylaxis   Tylenol  [Acetaminophen ] Hives    Family History  Problem Relation Age of Onset   Hypertension Mother    Migraines Mother    Hypertension Father    Migraines Father    Migraines Sister    Migraines Brother     Prior to Admission medications   Medication Sig Start Date End Date Taking? Authorizing Provider  amLODipine  (NORVASC ) 10 MG tablet Take 1 tablet (10 mg total) by mouth daily. 05/24/23   Gareth Mliss FALCON, FNP  benzonatate  (TESSALON ) 100 MG capsule Take 1 capsule (100 mg total) by mouth 2 (two) times daily as needed for cough. 06/07/23   Gareth Mliss FALCON, FNP  hydrochlorothiazide  (HYDRODIURIL ) 12.5 MG tablet Take 1 tablet (12.5 mg total) by mouth daily. 05/24/23  Gareth Mliss FALCON, FNP  predniSONE  (STERAPRED UNI-PAK 21 TAB) 10 MG (21) TBPK tablet Take as directed on package.  (60 mg po on day 1, 50 mg po on day 2...) 06/07/23   Gareth Mliss FALCON, FNP  promethazine -dextromethorphan (PROMETHAZINE -DM) 6.25-15 MG/5ML syrup Take 5 mLs by mouth 4 (four) times daily as needed for cough. 06/07/23   Gareth Mliss FALCON, FNP    Physical Exam: Vitals:   03/23/24 1107 03/23/24 1132 03/23/24 1200 03/23/24 1231  BP: (!) 180/111 (!) 163/119 (!) 154/99   Pulse: 70 62 64 63  Resp: (!)  21 18 14 18   Temp:    97.7 F (36.5 C)  TempSrc:    Oral  SpO2: 97% 95% 97% 95%  Weight:      Height:        Constitutional: NAD, calm, comfortable Vitals:   03/23/24 1107 03/23/24 1132 03/23/24 1200 03/23/24 1231  BP: (!) 180/111 (!) 163/119 (!) 154/99   Pulse: 70 62 64 63  Resp: (!) 21 18 14 18   Temp:    97.7 F (36.5 C)  TempSrc:    Oral  SpO2: 97% 95% 97% 95%  Weight:      Height:       Eyes: PERRL, lids and conjunctivae normal ENMT: Mucous membranes are moist. Posterior pharynx clear of any exudate or lesions.Normal dentition.  Neck: normal, supple, no masses, no thyromegaly Respiratory: clear to auscultation bilaterally, no wheezing, no crackles. Normal respiratory effort. No accessory muscle use.  Cardiovascular: Regular rate and rhythm, no murmurs / rubs / gallops. No extremity edema. 2+ pedal pulses.  Abdomen: no tenderness, no masses palpated. No hepatosplenomegaly. Bowel sounds positive. +CVA tenderness on left  Musculoskeletal: no clubbing / cyanosis. No joint deformity upper and lower extremities. Good ROM, no contractures. Normal muscle tone.  Skin: no rashes, lesions, ulcers. No induration Neurologic: CN 2-12 grossly intact. Sensation intact, DTR normal. Strength 5/5 in all 4.  Psychiatric: Normal judgment and insight. Alert and oriented x 3. Normal mood.    Labs on Admission: I have personally reviewed following labs and imaging studies  CBC: Recent Labs  Lab 03/23/24 0841  WBC 13.3*  HGB 13.9  HCT 40.9  MCV 95.1  PLT 128*   Basic Metabolic Panel: Recent Labs  Lab 03/23/24 0841  NA 139  K 3.5  CL 108  CO2 23  GLUCOSE 110*  BUN 13  CREATININE 0.97  CALCIUM  8.8*   GFR: Estimated Creatinine Clearance: 79.9 mL/min (by C-G formula based on SCr of 0.97 mg/dL). Liver Function Tests: Recent Labs  Lab 03/23/24 0841  AST 27  ALT 13  ALKPHOS 41  BILITOT 1.2  PROT 6.6  ALBUMIN 3.9   Recent Labs  Lab 03/23/24 0841  LIPASE 30   No results  for input(s): AMMONIA in the last 168 hours. Coagulation Profile: No results for input(s): INR, PROTIME in the last 168 hours. Cardiac Enzymes: No results for input(s): CKTOTAL, CKMB, CKMBINDEX, TROPONINI in the last 168 hours. BNP (last 3 results) No results for input(s): PROBNP in the last 8760 hours. HbA1C: No results for input(s): HGBA1C in the last 72 hours. CBG: No results for input(s): GLUCAP in the last 168 hours. Lipid Profile: No results for input(s): CHOL, HDL, LDLCALC, TRIG, CHOLHDL, LDLDIRECT in the last 72 hours. Thyroid  Function Tests: No results for input(s): TSH, T4TOTAL, FREET4, T3FREE, THYROIDAB in the last 72 hours. Anemia Panel: No results for input(s): VITAMINB12, FOLATE, FERRITIN, TIBC, IRON, RETICCTPCT in the last  72 hours. Urine analysis:    Component Value Date/Time   COLORURINE ORANGE (A) 03/23/2024 0841   APPEARANCEUR CLOUDY (A) 03/23/2024 0841   APPEARANCEUR Clear 10/03/2014 1133   LABSPEC  03/23/2024 0841    TEST NOT REPORTED DUE TO COLOR INTERFERENCE OF URINE PIGMENT   LABSPEC 1.032 10/03/2014 1133   PHURINE  03/23/2024 0841    TEST NOT REPORTED DUE TO COLOR INTERFERENCE OF URINE PIGMENT   GLUCOSEU (A) 03/23/2024 0841    TEST NOT REPORTED DUE TO COLOR INTERFERENCE OF URINE PIGMENT   GLUCOSEU Negative 10/03/2014 1133   HGBUR (A) 03/23/2024 0841    TEST NOT REPORTED DUE TO COLOR INTERFERENCE OF URINE PIGMENT   BILIRUBINUR (A) 03/23/2024 0841    TEST NOT REPORTED DUE TO COLOR INTERFERENCE OF URINE PIGMENT   BILIRUBINUR Negative 10/03/2014 1133   KETONESUR (A) 03/23/2024 0841    TEST NOT REPORTED DUE TO COLOR INTERFERENCE OF URINE PIGMENT   PROTEINUR (A) 03/23/2024 0841    TEST NOT REPORTED DUE TO COLOR INTERFERENCE OF URINE PIGMENT   NITRITE (A) 03/23/2024 0841    TEST NOT REPORTED DUE TO COLOR INTERFERENCE OF URINE PIGMENT   LEUKOCYTESUR (A) 03/23/2024 0841    TEST NOT REPORTED DUE TO COLOR  INTERFERENCE OF URINE PIGMENT   LEUKOCYTESUR 1+ 10/03/2014 1133    Radiological Exams on Admission: CT ABDOMEN PELVIS WO CONTRAST Result Date: 03/23/2024 CLINICAL DATA:  Left flank pain beginning yesterday. Nausea and vomiting. EXAM: CT ABDOMEN AND PELVIS WITHOUT CONTRAST TECHNIQUE: Multidetector CT imaging of the abdomen and pelvis was performed following the standard protocol without IV contrast. RADIATION DOSE REDUCTION: This exam was performed according to the departmental dose-optimization program which includes automated exposure control, adjustment of the mA and/or kV according to patient size and/or use of iterative reconstruction technique. COMPARISON:  02/13/2017 FINDINGS: Lower chest: No acute findings. Hepatobiliary: No mass visualized on this unenhanced exam. Prior cholecystectomy. No evidence of biliary obstruction. Pancreas: No mass or inflammatory process visualized on this unenhanced exam. Spleen:  Within normal limits in size. Adrenals/Urinary tract: Mild asymmetric left renal swelling and perinephric stranding is seen. Moderate pelvicaliectasis is seen, without evidence ureteral calculus or dilatation. No renal calculi identified. Bladder is unremarkable for degree of filling. Stomach/Bowel: No evidence of obstruction, inflammatory process, or abnormal fluid collections. Vascular/Lymphatic: No pathologically enlarged lymph nodes identified. No evidence of abdominal aortic aneurysm. Reproductive:  No mass or other significant abnormality. Other:  None. Musculoskeletal:  No suspicious bone lesions identified. IMPRESSION: Left renal swelling, perinephric stranding, and moderate left pelvicaliectasis. No ureteral dilatation or obstructing calculus seen. Differential diagnosis includes ascending urinary tract infection with pyelonephritis, recently passed calculus, and congenital left UPJ obstruction. Electronically Signed   By: Norleen DELENA Kil M.D.   On: 03/23/2024 12:10    EKG: Independently  reviewed. N/a  Assessment/Plan   Acute pyelonephritis  -admit to med tele  - f/u with urine and blood cultures  - ctx  -check inflammatory markers - supportive care ivfs, anti-emetic and pain medication  - CT with ? Obstruction per urology not obstructed and  urology  intervention needed   Uncontrolled Hypertension  - patient out of medication x 1 year  - s/p labetalol  in ED with good effect - will resume amlodipine   - prn labetalol     Bacterial vaginosis  - on metronidazole , transition to oral as able   Anemia -no active issue   Anxiety  -no active issue    Thrombocytopenia ,Mild -plt 128 , continue  monitor     DVT prophylaxis: scd Code Status: full/ as discussed per patient wishes in event of cardiac arrest  Family Communication: none at bedside Disposition Plan: full/ as discussed per patient wishes in event of cardiac arrest  Consults called  Admission status: med tele   Camila DELENA Ned MD [facility]   If 7PM-7AM, please contact night-coverage [email or web address] Password [email or web address]  03/23/2024, 1:00 PM

## 2024-03-23 NOTE — ED Notes (Signed)
 Patient transported to CT

## 2024-03-23 NOTE — ED Provider Notes (Signed)
 [facility] Provider Note    Event Date/Time   First MD Initiated Contact with Patient 03/23/24 [phone]     (approximate)   History   Flank Pain   HPI  Matisha Termine is a 33 y.o. female with a past medical history of hypertension, ITP, adjustment disorder, anxiety who presents today for evaluation of left lower quadrant abdominal pain and left-sided flank pain.  Patient reports that her pain began last night.  She has had vomiting beginning today, no diarrhea.  No fevers or chills.  No burning with urination though she does report that her urine was more orange-colored.  She reports that she has not taken her blood pressure meds in nearly 1 year.  No headache or chest pain. She denies vaginal discharge or bleeding, reports that she is sexually active with 1 female partner.  She has a history of PID.  She denies burning with urination.  Patient Active Problem List   Diagnosis Date Noted   Acute pyelonephritis 03/23/2024   Primary hypertension 05/24/2023   Chronic ITP (idiopathic thrombocytopenia) (HCC) 05/24/2023   Cervical radiculopathy 02/09/2021   Iron deficiency anemia 03/23/2018   Elevated blood pressure affecting pregnancy in second trimester, antepartum 01/14/2018   Hemolysis, elevated liver enzymes, and low platelet (HELLP) syndrome during pregnancy, delivered, with postpartum complication 01/14/2018   Unwanted fertility 01/14/2018   Adjustment disorder with anxiety 09/08/2017   Gonorrhea affecting pregnancy in first trimester 08/24/2017   Thrombocytopenia (HCC) 07/12/2011         Physical Exam   Triage Vital Signs: ED Triage Vitals  Encounter Vitals Group     BP 03/23/24 0831 (!) 195/127     Girls Systolic BP Percentile --      Girls Diastolic BP Percentile --      Boys Systolic BP Percentile --      Boys Diastolic BP Percentile --      Pulse Rate 03/23/24 0831 82     Resp 03/23/24 0831 18     Temp 03/23/24 0831 97.8 F (36.6 C)      Temp Source 03/23/24 0831 Oral     SpO2 03/23/24 0831 100 %     Weight 03/23/24 0832 154 lb (69.9 kg)     Height 03/23/24 0832 5' 4 (1.626 m)     Head Circumference --      Peak Flow --      Pain Score 03/23/24 0832 10     Pain Loc --      Pain Education --      Exclude from Growth Chart --     Most recent vital signs: Vitals:   03/23/24 1336 03/23/24 1400  BP: (!) 168/115 (!) 167/112  Pulse: 72 68  Resp: 18 12  Temp:    SpO2: 96% 97%    Physical Exam Vitals and nursing note reviewed.  Constitutional:      General: Awake and alert. No acute distress.    Appearance: Normal appearance. The patient is normal weight.  HENT:     Head: Normocephalic and atraumatic.     Mouth: Mucous membranes are moist.  Eyes:     General: PERRL. Normal EOMs        Right eye: No discharge.        Left eye: No discharge.     Conjunctiva/sclera: Conjunctivae normal.  Cardiovascular:     Rate and Rhythm: Normal rate and regular rhythm.     Pulses: Normal pulses.  Pulmonary:  Effort: Pulmonary effort is normal. No respiratory distress.     Breath sounds: Normal breath sounds.  Abdominal:     Abdomen is soft. There is no abdominal tenderness. No rebound or guarding. No distention. Pelvic exam with chaperone Janeeta (ER tech): Scant discharge that is white in the vaginal vault, negative chandelier sign, no cervical motion tenderness. Musculoskeletal:        General: No swelling. Normal range of motion.     Cervical back: Normal range of motion and neck supple.  Skin:    General: Skin is warm and dry.     Capillary Refill: Capillary refill takes less than 2 seconds.     Findings: No rash.  Neurological:     Mental Status: The patient is awake and alert.      ED Results / Procedures / Treatments   Labs (all labs ordered are listed, but only abnormal results are displayed) Labs Reviewed  WET PREP, GENITAL - Abnormal; Notable for the following components:      Result Value    Clue Cells Wet Prep HPF POC PRESENT (*)    WBC, Wet Prep HPF POC >=10 (*)    All other components within normal limits  URINALYSIS, ROUTINE W REFLEX MICROSCOPIC - Abnormal; Notable for the following components:   Color, Urine ORANGE (*)    APPearance CLOUDY (*)    Glucose, UA   (*)    Value: TEST NOT REPORTED DUE TO COLOR INTERFERENCE OF URINE PIGMENT   Hgb urine dipstick   (*)    Value: TEST NOT REPORTED DUE TO COLOR INTERFERENCE OF URINE PIGMENT   Bilirubin Urine   (*)    Value: TEST NOT REPORTED DUE TO COLOR INTERFERENCE OF URINE PIGMENT   Ketones, ur   (*)    Value: TEST NOT REPORTED DUE TO COLOR INTERFERENCE OF URINE PIGMENT   Protein, ur   (*)    Value: TEST NOT REPORTED DUE TO COLOR INTERFERENCE OF URINE PIGMENT   Nitrite   (*)    Value: TEST NOT REPORTED DUE TO COLOR INTERFERENCE OF URINE PIGMENT   Leukocytes,Ua   (*)    Value: TEST NOT REPORTED DUE TO COLOR INTERFERENCE OF URINE PIGMENT   Bacteria, UA FEW (*)    All other components within normal limits  BASIC METABOLIC PANEL WITH GFR - Abnormal; Notable for the following components:   Glucose, Bld 110 (*)    Calcium  8.8 (*)    All other components within normal limits  CBC - Abnormal; Notable for the following components:   WBC 13.3 (*)    Platelets 128 (*)    All other components within normal limits  HEPATIC FUNCTION PANEL - Abnormal; Notable for the following components:   Indirect Bilirubin 1.1 (*)    All other components within normal limits  CHLAMYDIA/NGC RT PCR ([facility] ONLY)            LIPASE, BLOOD  HIV ANTIBODY (ROUTINE TESTING W REFLEX)  RPR  POC URINE PREG, ED     EKG     RADIOLOGY I independently reviewed and interpreted imaging and agree with radiologists findings.     PROCEDURES:  Critical Care performed:   .Critical Care  Performed by: Marry Kusch E, PA-C Authorized by: Kriston Pasquarello E, PA-C   Critical care provider statement:    Critical care time (minutes):  40   Critical care was  necessary to treat or prevent imminent or life-threatening deterioration of the following conditions:  CNS  failure or compromise and metabolic crisis   Critical care was time spent personally by me on the following activities:  Development of treatment plan with patient or surrogate, discussions with consultants, evaluation of patient's response to treatment, examination of patient, ordering and review of laboratory studies, ordering and review of radiographic studies, ordering and performing treatments and interventions, pulse oximetry, re-evaluation of patient's condition, review of old charts and obtaining history from patient or surrogate   I assumed direction of critical care for this patient from another provider in my specialty: no     Care discussed with: admitting provider      MEDICATIONS ORDERED IN ED: Medications  metroNIDAZOLE  (FLAGYL ) IVPB 500 mg (0 mg Intravenous Stopped 03/23/24 1413)  cefTRIAXone  (ROCEPHIN ) 2 g in sodium chloride  0.9 % 100 mL IVPB (has no administration in time range)  amLODipine  (NORVASC ) tablet 10 mg (10 mg Oral Given 03/23/24 1411)  labetalol  (NORMODYNE ) injection 10 mg (has no administration in time range)  oxyCODONE  (Oxy IR/ROXICODONE ) immediate release tablet 5 mg (5 mg Oral Given 03/23/24 1411)  traMADol  (ULTRAM ) tablet 50 mg (has no administration in time range)  fentaNYL  (SUBLIMAZE ) injection 12.5 mcg (has no administration in time range)  ondansetron  (ZOFRAN ) tablet 4 mg (has no administration in time range)    Or  ondansetron  (ZOFRAN ) injection 4 mg (has no administration in time range)  albuterol  (PROVENTIL ) (2.5 MG/3ML) 0.083% nebulizer solution 2.5 mg (has no administration in time range)  ketorolac  (TORADOL ) 15 MG/ML injection 15 mg (15 mg Intravenous Given 03/23/24 0928)  morphine  (PF) 4 MG/ML injection 4 mg (4 mg Intravenous Given 03/23/24 1034)  labetalol  (NORMODYNE ) injection 5 mg (5 mg Intravenous Given 03/23/24 1108)  fentaNYL  (SUBLIMAZE )  injection 50 mcg (50 mcg Intravenous Given 03/23/24 1208)     IMPRESSION / MDM / ASSESSMENT AND PLAN / ED COURSE  I reviewed the triage vital signs and the nursing notes.   Differential diagnosis includes, but is not limited to, STD/PID, UTI/pyelonephritis, nephrolithiasis, hypertensive urgency or emergency, appendicitis, abscess.  I reviewed the patient's chart.  She had PID in 2018 which was treated with Rocephin  and doxycycline .  Patient presents the emergency department uncomfortable in appearance, and hypertensive to 195/127.  She was placed on the cardiac monitor and her blood pressure was repeated multiple times after treating her pain to see if better pain control would bring down her blood pressure.  Ultimately remained elevated, and she was treated with 5 mg of IV labetalol  with good effect.  Labs obtained in triage are revealing for a leukocytosis to 13.3, low platelets which are expected given her history of thrombocytopenia.  Given her history of PID, patient did agree to pelvic exam which was performed with chaperone Janeeta (tech).  She has no cervical motion tenderness or chandelier sign, not consistent with PID.  Swabs obtained and sent to lab, positive for BV.  Urinalysis is suggestive of urinary tract infection.  CT scan obtained without contrast given her history of contrast dye reveals findings suspicious for pyelonephritis or recently passed calculus.  There is no ureteral stone.  Given her infected appearing urine, this is likely pyelonephritis.  However, I did review her previous CAT scan, which there is no mention of pelvicaliectasis or UPJ obstruction.  Therefore I did consult Dr. Alvaro with urology who feels that there is nothing to do from a urological standpoint.  However, given patient's continued pain requiring multiple narcotics, her significantly elevated blood pressure, and her obvious UTI/pyelonephritis and flank pain,  will admit for continued IV antibiotics and pain  control.  I have consulted the hospitalist for admission.  Patient accepted by Dr. Debby.  Patient's presentation is most consistent with acute presentation with potential threat to life or bodily function.   Clinical Course as of 03/23/24 1414  Fri Mar 23, 2024  0925 I reviewed the patient's chart.  Patient has tolerated Toradol  in the past, was given in 2022 at the emergency department per care everywhere. [JP]  [phone] Patient had CT with contrast in 2018 [JP]  1053 Discussed with CT, they will not do the CT scan with contrast without the 4-hour prep.  Noncontrast CT ordered [JP]  1227 Patient continues to be very uncomfortable [JP]  1239 Discussed with Dr. Alvaro with urology, reports nothing to do from a urology standpoint, will treat for Pilo [JP]    Clinical Course User Index [JP] Lynnelle Mesmer E, PA-C     FINAL CLINICAL IMPRESSION(S) / ED DIAGNOSES   Final diagnoses:  Pyelonephritis  BV (bacterial vaginosis)  Hypertensive urgency     Rx / DC Orders   ED Discharge Orders     None        Note:  This document was prepared using Dragon voice recognition software and may include unintentional dictation errors.   Marcin Holte E, PA-C 03/23/24 1414    Suzanne Kirsch, MD 03/23/24 1546

## 2024-03-23 NOTE — ED Notes (Signed)
 Attempted to put pt on CCMD monitor and was placed on hold for longer than 3 mins. Will retry in a few mins.

## 2024-03-24 DIAGNOSIS — N10 Acute tubulo-interstitial nephritis: Secondary | ICD-10-CM

## 2024-03-24 LAB — CBC
HCT: 40.3 % (ref 36.0–46.0)
Hemoglobin: 13.7 g/dL (ref 12.0–15.0)
MCH: 32.2 pg (ref 26.0–34.0)
MCHC: 34 g/dL (ref 30.0–36.0)
MCV: 94.6 fL (ref 80.0–100.0)
Platelets: 101 10*3/uL — ABNORMAL LOW (ref 150–400)
RBC: 4.26 MIL/uL (ref 3.87–5.11)
RDW: 13.3 % (ref 11.5–15.5)
WBC: 7.4 10*3/uL (ref 4.0–10.5)
nRBC: 0 % (ref 0.0–0.2)

## 2024-03-24 LAB — COMPREHENSIVE METABOLIC PANEL WITH GFR
ALT: 195 U/L — ABNORMAL HIGH (ref 0–44)
AST: 383 U/L — ABNORMAL HIGH (ref 15–41)
Albumin: 3.3 g/dL — ABNORMAL LOW (ref 3.5–5.0)
Alkaline Phosphatase: 87 U/L (ref 38–126)
Anion gap: 7 (ref 5–15)
BUN: 8 mg/dL (ref 6–20)
CO2: 23 mmol/L (ref 22–32)
Calcium: 8.7 mg/dL — ABNORMAL LOW (ref 8.9–10.3)
Chloride: 108 mmol/L (ref 98–111)
Creatinine, Ser: 0.63 mg/dL (ref 0.44–1.00)
GFR, Estimated: >60 mL/min (ref >60)
Glucose, Bld: 101 mg/dL — ABNORMAL HIGH (ref 70–99)
Potassium: 3.4 mmol/L — ABNORMAL LOW (ref 3.5–5.1)
Sodium: 138 mmol/L (ref 135–145)
Total Bilirubin: 2.5 mg/dL — ABNORMAL HIGH (ref 0.0–1.2)
Total Protein: 6.3 g/dL — ABNORMAL LOW (ref 6.5–8.1)

## 2024-03-24 LAB — HIV ANTIBODY (ROUTINE TESTING W REFLEX): HIV Screen 4th Generation wRfx: NONREACTIVE

## 2024-03-24 LAB — RPR: RPR Ser Ql: NONREACTIVE

## 2024-03-24 MED ORDER — HYDRALAZINE HCL 20 MG/ML IJ SOLN
10.0000 mg | Freq: Four times a day (QID) | INTRAMUSCULAR | Status: DC | PRN
  Administered 2024-03-25: 10 mg via INTRAVENOUS
  Filled 2024-03-24: qty 1

## 2024-03-24 MED ORDER — ENOXAPARIN SODIUM 40 MG/0.4ML IJ SOSY
40.0000 mg | PREFILLED_SYRINGE | INTRAMUSCULAR | Status: DC
  Administered 2024-03-24 – 2024-03-25 (×2): 40 mg via SUBCUTANEOUS
  Filled 2024-03-24 (×2): qty 0.4

## 2024-03-24 MED ORDER — MORPHINE SULFATE (PF) 2 MG/ML IV SOLN
2.0000 mg | INTRAVENOUS | Status: DC | PRN
  Administered 2024-03-24: 2 mg via INTRAVENOUS
  Filled 2024-03-24: qty 1

## 2024-03-24 MED ORDER — LORAZEPAM 2 MG/ML IJ SOLN
2.0000 mg | Freq: Four times a day (QID) | INTRAMUSCULAR | Status: DC | PRN
  Administered 2024-03-24 – 2024-03-25 (×2): 2 mg via INTRAVENOUS
  Filled 2024-03-24 (×2): qty 1

## 2024-03-24 NOTE — Plan of Care (Signed)
  Problem: Education: Goal: Knowledge of General Education information will improve Description: Including pain rating scale, medication(s)/side effects and non-pharmacologic comfort measures Outcome: Progressing   Problem: Urinary Elimination: Goal: Signs and symptoms of infection will decrease Outcome: Progressing

## 2024-03-24 NOTE — Progress Notes (Signed)
 PROGRESS NOTE    Dominique Shea  FMW:[id number] DOB: 10-Sep-1991 DOA: 03/23/2024 PCP: Dominique Mliss FALCON, FNP  Chief Complaint  Patient presents with   Flank Pain    Hospital Course:  Dominique Shea is a 33 year old female with anemia, anxiety, PID, thrombocytopenia, who presents with left-sided flank pain for 24 hours with associated nausea, vomiting, and discolored urine.  She endorsed chills but no fever.  On arrival to the ED she is afebrile, blood pressure 195/127, heart rate 82.  Labs revealed leukocytosis of 13.3, thrombocytopenia 129, UA consistent with infection.  Patient underwent pelvic exam in the ED, no cervical motion tenderness.  Patient was swabbed, labs consistent with BV, negative for G/C. CT without contrast suspicious for Pyelo or recently passed calculus.  ED PA consulted urology. She was admitted for  Subjective: Patient endorses continued back pain.  She also reports she had an episode of vomiting this morning.  She is hungry now and would like to try regular food.   Objective: Vitals:   03/23/24 1633 03/23/24 2013 03/24/24 0347 03/24/24 0713  BP: (!) 149/108 (!) 178/104 (!) 143/103 (!) 173/108  Pulse: 64 (!) 59 (!) 59 61  Resp: 20  16 16   Temp: (!) 97.5 F (36.4 C) (!) 97.4 F (36.3 C) 98.1 F (36.7 C) 98.1 F (36.7 C)  TempSrc: Oral     SpO2: 98% 100% 100% 100%  Weight:      Height:        Intake/Output Summary (Last 24 hours) at 03/24/2024 0800 Last data filed at 03/23/2024 2013 Gross per 24 hour  Intake 688.63 ml  Output --  Net 688.63 ml   Filed Weights   03/23/24 0832  Weight: 69.9 kg    Examination: General exam: Appears calm and comfortable, NAD  Respiratory system: No work of breathing, symmetric chest wall expansion Cardiovascular system: S1 & S2 heard, RRR.  Gastrointestinal system: Abdomen is nondistended, soft, left-sided flank pain Neuro: Alert and oriented. No focal neurological deficits. Extremities: Symmetric,  expected ROM Skin: No rashes, lesions Psychiatry: Demonstrates appropriate judgement and insight. Mood & affect appropriate for situation.   Assessment & Plan:  Principal Problem:   Acute pyelonephritis    Pyelonephritis - Does not appear that urine cultures were taken on admission.  Will send now.  Please note she has been on antibiotics for over 24 hours at the time of this test - Continue ceftriaxone  for now -Continue maintenance IV fluids, until tolerating p.o. - Supportive care with pain meds - Questionable obstruction on CT, reviewed by urology.  No intervention needed  Hypertensive urgency - Blood pressure 195/127 on arrival - Patient has been out of medications for over a year - Has restarted amlodipine , will titrate as needed - As needed hydralazine  and labetalol   Bacterial vaginosis - Seen on wet prep.  Continue Flagyl .  Transition to oral when tolerating p.o.  Anemia - Hemoglobin currently stable  Anxiety - Not on medications at home - Outpatient follow-up  Thrombocytopenia, chronic - Chronically as low as 70.  Near baseline presently - Trend CBC - No acute signs of bleeding  Elevated LFTs - AST to ALT ratio 2:1, total bili elevated 2.5 - LFTs within normal limits on arrival - Suspect acute alcoholic hepatitis - Will obtain viral hepatitis panel  Alcohol abuse - Patient endorses drinking 1 pint of Crown liquor every other day for the last 3 to 4 years.  Denies history of alcohol withdrawal - Placed on CIWA protocol -  As needed Ativan  - No tremor or tachycardia noted this morning.  DVT prophylaxis: lovenox   Code Status: Full Code Disposition:  inpatient pending clinical resolution. Eventually DC to home  Consultants:    Procedures:     Antimicrobials:  Anti-infectives (From admission, onward)    Start     Dose/Rate Route Frequency Ordered Stop   03/24/24 1230  cefTRIAXone  (ROCEPHIN ) 2 g in sodium chloride  0.9 % 100 mL IVPB        2 g 200  mL/hr over 30 Minutes Intravenous Every 24 hours 03/23/24 1300     03/23/24 1400  metroNIDAZOLE  (FLAGYL ) IVPB 500 mg        500 mg 100 mL/hr over 60 Minutes Intravenous Every 12 hours 03/23/24 1300     03/23/24 1230  cefTRIAXone  (ROCEPHIN ) 1 g in sodium chloride  0.9 % 100 mL IVPB  Status:  Discontinued        1 g 200 mL/hr over 30 Minutes Intravenous Every 24 hours 03/23/24 1225 03/23/24 1300       Data Reviewed: I have personally reviewed following labs and imaging studies CBC: Recent Labs  Lab 03/23/24 0841 03/24/24 0535  WBC 13.3* 7.4  HGB 13.9 13.7  HCT 40.9 40.3  MCV 95.1 94.6  PLT 128* 101*   Basic Metabolic Panel: Recent Labs  Lab 03/23/24 0841 03/24/24 0535  NA 139 138  K 3.5 3.4*  CL 108 108  CO2 23 23  GLUCOSE 110* 101*  BUN 13 8  CREATININE 0.97 0.63  CALCIUM  8.8* 8.7*   GFR: Estimated Creatinine Clearance: 96.9 mL/min (by C-G formula based on SCr of 0.63 mg/dL). Liver Function Tests: Recent Labs  Lab 03/23/24 0841 03/24/24 0535  AST 27 383*  ALT 13 195*  ALKPHOS 41 87  BILITOT 1.2 2.5*  PROT 6.6 6.3*  ALBUMIN 3.9 3.3*   CBG: No results for input(s): GLUCAP in the last 168 hours.  Recent Results (from the past 240 hours)  Chlamydia/NGC rt PCR ([facility] only)     Status: None   Collection Time: 03/23/24 10:20 AM   Specimen: Cervical/Vaginal swab  Result Value Ref Range Status   Specimen source GC/Chlam ENDOCERVICAL  Final   Chlamydia Tr NOT DETECTED NOT DETECTED Final   N gonorrhoeae NOT DETECTED NOT DETECTED Final    Comment: (NOTE) This CT/NG assay has not been evaluated in patients with a history of  hysterectomy. Performed at [facility], [address] [place], [place] [address]   Wet prep, genital     Status: Abnormal   Collection Time: 03/23/24 10:20 AM   Specimen: Cervical/Vaginal swab  Result Value Ref Range Status   Yeast Wet Prep HPF POC NONE SEEN NONE SEEN Final   Trich, Wet Prep NONE SEEN NONE SEEN Final   Clue  Cells Wet Prep HPF POC PRESENT (A) NONE SEEN Final   WBC, Wet Prep HPF POC >=10 (A) <10 Final   Sperm NONE SEEN  Final    Comment: Performed at [facility], [address], [place], [place] [address]     Radiology Studies: CT ABDOMEN PELVIS WO CONTRAST Result Date: 03/23/2024 CLINICAL DATA:  Left flank pain beginning yesterday. Nausea and vomiting. EXAM: CT ABDOMEN AND PELVIS WITHOUT CONTRAST TECHNIQUE: Multidetector CT imaging of the abdomen and pelvis was performed following the standard protocol without IV contrast. RADIATION DOSE REDUCTION: This exam was performed according to the departmental dose-optimization program which includes automated exposure control, adjustment of the mA and/or kV  according to patient size and/or use of iterative reconstruction technique. COMPARISON:  02/13/2017 FINDINGS: Lower chest: No acute findings. Hepatobiliary: No mass visualized on this unenhanced exam. Prior cholecystectomy. No evidence of biliary obstruction. Pancreas: No mass or inflammatory process visualized on this unenhanced exam. Spleen:  Within normal limits in size. Adrenals/Urinary tract: Mild asymmetric left renal swelling and perinephric stranding is seen. Moderate pelvicaliectasis is seen, without evidence ureteral calculus or dilatation. No renal calculi identified. Bladder is unremarkable for degree of filling. Stomach/Bowel: No evidence of obstruction, inflammatory process, or abnormal fluid collections. Vascular/Lymphatic: No pathologically enlarged lymph nodes identified. No evidence of abdominal aortic aneurysm. Reproductive:  No mass or other significant abnormality. Other:  None. Musculoskeletal:  No suspicious bone lesions identified. IMPRESSION: Left renal swelling, perinephric stranding, and moderate left pelvicaliectasis. No ureteral dilatation or obstructing calculus seen. Differential diagnosis includes ascending urinary tract infection with pyelonephritis, recently passed  calculus, and congenital left UPJ obstruction. Electronically Signed   By: Norleen DELENA Kil M.D.   On: 03/23/2024 12:10    Scheduled Meds:  amLODipine   10 mg Oral Daily   Continuous Infusions:  cefTRIAXone  (ROCEPHIN )  IV     metronidazole  500 mg (03/24/24 0429)     LOS: 1 day  MDM: Patient is high risk for one or more organ failure.  They necessitate ongoing hospitalization for continued IV therapies and subsequent lab monitoring. Total time spent interpreting labs and vitals, reviewing the medical record, coordinating care amongst consultants and care team members, directly assessing and discussing care with the patient and/or family: 55 min   Elaina Cara, DO [facility]  To contact the attending physician between 7A-7P please use Epic Chat. To contact the covering physician during after hours 7P-7A, please review Amion.  03/24/2024, 8:00 AM   *This document has been created with the assistance of dictation software. Please excuse typographical errors. *

## 2024-03-25 DIAGNOSIS — N10 Acute tubulo-interstitial nephritis: Secondary | ICD-10-CM | POA: Diagnosis not present

## 2024-03-25 LAB — COMPREHENSIVE METABOLIC PANEL WITH GFR
ALT: 128 U/L — ABNORMAL HIGH (ref 0–44)
AST: 113 U/L — ABNORMAL HIGH (ref 15–41)
Albumin: 3.7 g/dL (ref 3.5–5.0)
Alkaline Phosphatase: 97 U/L (ref 38–126)
Anion gap: 6 (ref 5–15)
BUN: 6 mg/dL (ref 6–20)
CO2: 23 mmol/L (ref 22–32)
Calcium: 8.6 mg/dL — ABNORMAL LOW (ref 8.9–10.3)
Chloride: 107 mmol/L (ref 98–111)
Creatinine, Ser: 0.66 mg/dL (ref 0.44–1.00)
GFR, Estimated: >60 mL/min (ref >60)
Glucose, Bld: 102 mg/dL — ABNORMAL HIGH (ref 70–99)
Potassium: 3.1 mmol/L — ABNORMAL LOW (ref 3.5–5.1)
Sodium: 136 mmol/L (ref 135–145)
Total Bilirubin: 1.7 mg/dL — ABNORMAL HIGH (ref 0.0–1.2)
Total Protein: 7 g/dL (ref 6.5–8.1)

## 2024-03-25 LAB — CBC WITH DIFFERENTIAL/PLATELET
Abs Immature Granulocytes: 0.02 10*3/uL (ref 0.00–0.07)
Basophils Absolute: 0 10*3/uL (ref 0.0–0.1)
Basophils Relative: 0 %
Eosinophils Absolute: 0.1 10*3/uL (ref 0.0–0.5)
Eosinophils Relative: 1 %
HCT: 40.6 % (ref 36.0–46.0)
Hemoglobin: 13.7 g/dL (ref 12.0–15.0)
Immature Granulocytes: 0 %
Lymphocytes Relative: 19 %
Lymphs Abs: 1.4 10*3/uL (ref 0.7–4.0)
MCH: 32.2 pg (ref 26.0–34.0)
MCHC: 33.7 g/dL (ref 30.0–36.0)
MCV: 95.3 fL (ref 80.0–100.0)
Monocytes Absolute: 0.8 10*3/uL (ref 0.1–1.0)
Monocytes Relative: 10 %
Neutro Abs: 5.3 10*3/uL (ref 1.7–7.7)
Neutrophils Relative %: 70 %
Platelets: 107 10*3/uL — ABNORMAL LOW (ref 150–400)
RBC: 4.26 MIL/uL (ref 3.87–5.11)
RDW: 13.2 % (ref 11.5–15.5)
WBC: 7.7 10*3/uL (ref 4.0–10.5)
nRBC: 0 % (ref 0.0–0.2)

## 2024-03-25 LAB — HEPATITIS PANEL, ACUTE
HCV Ab: NONREACTIVE
Hep A IgM: NONREACTIVE
Hep B C IgM: NONREACTIVE
Hepatitis B Surface Ag: NONREACTIVE

## 2024-03-25 LAB — URINE CULTURE: Culture: NO GROWTH

## 2024-03-25 LAB — MAGNESIUM: Magnesium: 2.1 mg/dL (ref 1.7–2.4)

## 2024-03-25 LAB — PHOSPHORUS: Phosphorus: 3.4 mg/dL (ref 2.5–4.6)

## 2024-03-25 MED ORDER — LORAZEPAM 2 MG/ML IJ SOLN
2.0000 mg | Freq: Four times a day (QID) | INTRAMUSCULAR | Status: DC
  Administered 2024-03-25: 2 mg via INTRAVENOUS
  Filled 2024-03-25: qty 1

## 2024-03-25 MED ORDER — METRONIDAZOLE 500 MG PO TABS
500.0000 mg | ORAL_TABLET | Freq: Two times a day (BID) | ORAL | Status: DC
  Administered 2024-03-25 – 2024-03-26 (×2): 500 mg via ORAL
  Filled 2024-03-25 (×2): qty 1

## 2024-03-25 MED ORDER — POTASSIUM CHLORIDE CRYS ER 20 MEQ PO TBCR
40.0000 meq | EXTENDED_RELEASE_TABLET | Freq: Once | ORAL | Status: AC
  Administered 2024-03-25: 40 meq via ORAL
  Filled 2024-03-25: qty 2

## 2024-03-25 NOTE — Progress Notes (Signed)
 PROGRESS NOTE    Dominique Shea  FMW:[id number] DOB: July 23, 1991 DOA: 03/23/2024 PCP: Gareth Mliss FALCON, FNP  Chief Complaint  Patient presents with   Flank Pain    Hospital Course:  Dominique Shea is a 33 year old female with anemia, anxiety, PID, thrombocytopenia, who presents with left-sided flank pain for 24 hours with associated nausea, vomiting, and discolored urine.  She endorsed chills but no fever.  On arrival to the ED she is afebrile, blood pressure 195/127, heart rate 82.  Labs revealed leukocytosis of 13.3, thrombocytopenia 129, UA consistent with infection.  Patient underwent pelvic exam in the ED, no cervical motion tenderness.  Patient was swabbed, labs consistent with BV, negative for G/C. CT without contrast suspicious for Pyelo or recently passed calculus.  ED PA consulted urology. She was admitted for  Subjective: Patient endorses headache, nausea/vomiting. Still endorsing some flank pain. Has tremor in b/l hands this AM.  Objective: Vitals:   03/25/24 1019 03/25/24 1132 03/25/24 1208 03/25/24 1232  BP: (!) 130/110  (!) 140/90   Pulse:  88  82  Resp:      Temp:      TempSrc:      SpO2:      Weight:      Height:        Intake/Output Summary (Last 24 hours) at 03/25/2024 1416 Last data filed at 03/25/2024 0254 Gross per 24 hour  Intake 100 ml  Output --  Net 100 ml   Filed Weights   03/23/24 0832  Weight: 69.9 kg    Examination: General exam: Appears calm and comfortable, NAD  Respiratory system: No work of breathing, symmetric chest wall expansion Cardiovascular system: S1 & S2 heard, RRR.  Gastrointestinal system: Abdomen is nondistended, soft, left-sided flank pain Neuro: Alert and oriented. No focal neurological deficits. Extremities: Symmetric, expected ROM Skin: No rashes, lesions Psychiatry: Demonstrates appropriate judgement and insight. Mood & affect appropriate for situation.   Assessment & Plan:  Principal Problem:   Acute  pyelonephritis    Pyelonephritis - No urine cultures taken on admission.  Were sent on 6/28. Note she has been on antibiotics for over 24 hours at the time of this test - Continue with ceftriaxone  for now - Tolerating p.o. better now.  Discontinue IV fluids. - Questionable obstruction on CT, reviewed by urology.  No intervention needed  Hypertensive urgency - Blood pressure 195/127 on arrival - Patient has been out of medications for over a year - Have started amlodipine .  Blood pressure much improved now - Continue to monitor closely, titrate as needed - As needed hydralazine  and labetalol   Bacterial vaginosis - Seen on wet prep.  Continue Flagyl .  Transition to oral when tolerating p.o.  Anemia - Hemoglobin currently stable  Anxiety - Not on medications at home - Outpatient follow-up  Thrombocytopenia, chronic - Chronically as low as 70.  Near baseline presently - Trend CBC - No acute signs of bleeding  Elevated LFTs - AST to ALT ratio 2:1, total bili elevated 2.5 - LFTs downtrending now.  They were within normal limits on arrival - Suspect acute alcoholic hepatitis - Viral hepatitis panel so far negative.  Alcohol abuse - Patient endorses drinking 1 pint of Crown liquor every other day for the last 3 to 4 years.  Denies history of alcohol withdrawal - Currently on CIWA protocol.  With headache, vomiting, tremor today.  Will schedule 2 mg Ativan  every 6 hours Last drink: 6/26  DVT prophylaxis: lovenox   Code  Status: Full Code Disposition:  inpatient pending clinical resolution. Eventually DC to home  Consultants:    Procedures:     Antimicrobials:  Anti-infectives (From admission, onward)    Start     Dose/Rate Route Frequency Ordered Stop   03/24/24 1230  cefTRIAXone  (ROCEPHIN ) 2 g in sodium chloride  0.9 % 100 mL IVPB        2 g 200 mL/hr over 30 Minutes Intravenous Every 24 hours 03/23/24 1300     03/23/24 1400  metroNIDAZOLE  (FLAGYL ) IVPB 500 mg         500 mg 100 mL/hr over 60 Minutes Intravenous Every 12 hours 03/23/24 1300     03/23/24 1230  cefTRIAXone  (ROCEPHIN ) 1 g in sodium chloride  0.9 % 100 mL IVPB  Status:  Discontinued        1 g 200 mL/hr over 30 Minutes Intravenous Every 24 hours 03/23/24 1225 03/23/24 1300       Data Reviewed: I have personally reviewed following labs and imaging studies CBC: Recent Labs  Lab 03/23/24 0841 03/24/24 0535 03/25/24 0405  WBC 13.3* 7.4 7.7  NEUTROABS  --   --  5.3  HGB 13.9 13.7 13.7  HCT 40.9 40.3 40.6  MCV 95.1 94.6 95.3  PLT 128* 101* 107*   Basic Metabolic Panel: Recent Labs  Lab 03/23/24 0841 03/24/24 0535 03/25/24 0405  NA 139 138 136  K 3.5 3.4* 3.1*  CL 108 108 107  CO2 23 23 23   GLUCOSE 110* 101* 102*  BUN 13 8 6   CREATININE 0.97 0.63 0.66  CALCIUM  8.8* 8.7* 8.6*  MG  --   --  2.1  PHOS  --   --  3.4   GFR: Estimated Creatinine Clearance: 96.9 mL/min (by C-G formula based on SCr of 0.66 mg/dL). Liver Function Tests: Recent Labs  Lab 03/23/24 0841 03/24/24 0535 03/25/24 0405  AST 27 383* 113*  ALT 13 195* 128*  ALKPHOS 41 87 97  BILITOT 1.2 2.5* 1.7*  PROT 6.6 6.3* 7.0  ALBUMIN 3.9 3.3* 3.7   CBG: No results for input(s): GLUCAP in the last 168 hours.  Recent Results (from the past 240 hours)  Chlamydia/NGC rt PCR ([facility] only)     Status: None   Collection Time: 03/23/24 10:20 AM   Specimen: Cervical/Vaginal swab  Result Value Ref Range Status   Specimen source GC/Chlam ENDOCERVICAL  Final   Chlamydia Tr NOT DETECTED NOT DETECTED Final   N gonorrhoeae NOT DETECTED NOT DETECTED Final    Comment: (NOTE) This CT/NG assay has not been evaluated in patients with a history of  hysterectomy. Performed at [facility], [address] [place], [place] [address]   Wet prep, genital     Status: Abnormal   Collection Time: 03/23/24 10:20 AM   Specimen: Cervical/Vaginal swab  Result Value Ref Range Status   Yeast Wet Prep HPF POC NONE  SEEN NONE SEEN Final   Trich, Wet Prep NONE SEEN NONE SEEN Final   Clue Cells Wet Prep HPF POC PRESENT (A) NONE SEEN Final   WBC, Wet Prep HPF POC >=10 (A) <10 Final   Sperm NONE SEEN  Final    Comment: Performed at [facility], [address] [place], [place] [address]     Radiology Studies: No results found.   Scheduled Meds:  amLODipine   10 mg Oral Daily   enoxaparin (LOVENOX) injection  40 mg Subcutaneous Q24H   Continuous Infusions:  cefTRIAXone  (ROCEPHIN )  IV Stopped (  03/25/24 1358)   metronidazole  500 mg (03/25/24 1358)     LOS: 2 days  MDM: Patient is high risk for one or more organ failure.  They necessitate ongoing hospitalization for continued IV therapies and subsequent lab monitoring. Total time spent interpreting labs and vitals, reviewing the medical record, coordinating care amongst consultants and care team members, directly assessing and discussing care with the patient and/or family: 55 min   Dominique Prestage, DO [facility]  To contact the attending physician between 7A-7P please use Epic Chat. To contact the covering physician during after hours 7P-7A, please review Amion.  03/25/2024, 2:16 PM   *This document has been created with the assistance of dictation software. Please excuse typographical errors. *

## 2024-03-25 NOTE — Plan of Care (Signed)
  Problem: Education: Goal: Knowledge of General Education information will improve Description: Including pain rating scale, medication(s)/side effects and non-pharmacologic comfort measures Outcome: Progressing   Problem: Urinary Elimination: Goal: Signs and symptoms of infection will decrease Outcome: Progressing

## 2024-03-26 ENCOUNTER — Encounter: Payer: Self-pay | Admitting: Nurse Practitioner

## 2024-03-26 DIAGNOSIS — N10 Acute tubulo-interstitial nephritis: Secondary | ICD-10-CM | POA: Diagnosis not present

## 2024-03-26 LAB — COMPREHENSIVE METABOLIC PANEL WITH GFR
ALT: 88 U/L — ABNORMAL HIGH (ref 0–44)
AST: 46 U/L — ABNORMAL HIGH (ref 15–41)
Albumin: 3.8 g/dL (ref 3.5–5.0)
Alkaline Phosphatase: 87 U/L (ref 38–126)
Anion gap: 11 (ref 5–15)
BUN: 6 mg/dL (ref 6–20)
CO2: 21 mmol/L — ABNORMAL LOW (ref 22–32)
Calcium: 9.2 mg/dL (ref 8.9–10.3)
Chloride: 105 mmol/L (ref 98–111)
Creatinine, Ser: 0.67 mg/dL (ref 0.44–1.00)
GFR, Estimated: >60 mL/min (ref >60)
Glucose, Bld: 103 mg/dL — ABNORMAL HIGH (ref 70–99)
Potassium: 3.5 mmol/L (ref 3.5–5.1)
Sodium: 137 mmol/L (ref 135–145)
Total Bilirubin: 1 mg/dL (ref 0.0–1.2)
Total Protein: 7.4 g/dL (ref 6.5–8.1)

## 2024-03-26 LAB — CBC WITH DIFFERENTIAL/PLATELET
Abs Immature Granulocytes: 0.02 10*3/uL (ref 0.00–0.07)
Basophils Absolute: 0 10*3/uL (ref 0.0–0.1)
Basophils Relative: 1 %
Eosinophils Absolute: 0.1 10*3/uL (ref 0.0–0.5)
Eosinophils Relative: 1 %
HCT: 41.9 % (ref 36.0–46.0)
Hemoglobin: 14.3 g/dL (ref 12.0–15.0)
Immature Granulocytes: 0 %
Lymphocytes Relative: 16 %
Lymphs Abs: 1.1 10*3/uL (ref 0.7–4.0)
MCH: 32.4 pg (ref 26.0–34.0)
MCHC: 34.1 g/dL (ref 30.0–36.0)
MCV: 94.8 fL (ref 80.0–100.0)
Monocytes Absolute: 0.7 10*3/uL (ref 0.1–1.0)
Monocytes Relative: 11 %
Neutro Abs: 4.7 10*3/uL (ref 1.7–7.7)
Neutrophils Relative %: 71 %
Platelets: 133 10*3/uL — ABNORMAL LOW (ref 150–400)
RBC: 4.42 MIL/uL (ref 3.87–5.11)
RDW: 13.2 % (ref 11.5–15.5)
WBC: 6.6 10*3/uL (ref 4.0–10.5)
nRBC: 0 % (ref 0.0–0.2)

## 2024-03-26 LAB — MAGNESIUM: Magnesium: 2 mg/dL (ref 1.7–2.4)

## 2024-03-26 LAB — PHOSPHORUS: Phosphorus: 3.2 mg/dL (ref 2.5–4.6)

## 2024-03-26 MED ORDER — CEFADROXIL 500 MG PO CAPS: 1000.0000 mg | ORAL_CAPSULE | Freq: Two times a day (BID) | ORAL | 0 refills | Status: AC

## 2024-03-26 MED ORDER — METRONIDAZOLE 500 MG PO TABS: 500.0000 mg | ORAL_TABLET | Freq: Two times a day (BID) | ORAL | 0 refills | Status: AC

## 2024-03-26 MED ORDER — AMLODIPINE BESYLATE 10 MG PO TABS: 10.0000 mg | ORAL_TABLET | Freq: Every day | ORAL | 0 refills | Status: DC

## 2024-03-26 MED ORDER — NAPROXEN 250 MG PO TABS
375.0000 mg | ORAL_TABLET | Freq: Two times a day (BID) | ORAL | Status: DC | PRN
  Filled 2024-03-26: qty 2
  Filled 2024-03-26: qty 1

## 2024-03-26 NOTE — Progress Notes (Signed)
 Mobility Specialist - Progress Note    03/26/24 1000  Mobility  Activity Ambulated independently in hallway  Level of Assistance Independent  Assistive Device None  Distance Ambulated (ft) 1000 ft  Range of Motion/Exercises Active  Activity Response Tolerated well  Mobility Referral Yes  Mobility visit 1 Mobility  Mobility Specialist Start Time (ACUTE ONLY) 1020  Mobility Specialist Stop Time (ACUTE ONLY) 1040  Mobility Specialist Time Calculation (min) (ACUTE ONLY) 20 min   Pt resting in bed on RA upon entry on RA. Pt STS and ambulates to hallway around NS Indep with no AD. Pt endorses pain in head RN aware and giving meds after ambulation. Pt returned to bed and left with needs in reach.   Guido Rumble Mobility Specialist 03/26/24, 11:01 AM

## 2024-03-26 NOTE — Discharge Summary (Signed)
 Physician Discharge Summary  Dominique Shea FMW:[id number] DOB: 1991/03/25 DOA: 03/23/2024  PCP: Dominique Shea FALCON, FNP  Admit date: 03/23/2024 Discharge date: 03/26/2024   Recommendations for Outpatient Follow-up:  Follow up with PCP in 1-2 weeks to review your blood pressure log and make medication changes as needed  Hospital Course: Dominique Shea is a 33 year old female with anemia, anxiety, PID, thrombocytopenia, who presents with left-sided flank pain for 24 hours with associated nausea, vomiting, and discolored urine.  She endorsed chills but no fever.  On arrival to the ED she is afebrile, blood pressure 195/127, heart rate 82.  Labs revealed leukocytosis of 13.3, thrombocytopenia 129, UA consistent with infection.  Patient underwent pelvic exam in the ED, no cervical motion tenderness.  Patient was swabbed, labs consistent with BV, negative for G/C. CT without contrast suspicious for Pyelo or recently passed calculus.  ED PA consulted urology who denied need for acute intervention.  Patient was started on ceftriaxone  and completed 4 days of IV antibiotic therapy.  She had significant improvement.  She will complete her course outpatient with p.o. antibiotics. Stay was briefly complicated by mild alcohol withdrawal.  Patient revealed that she drinks 1 pint of liquor every other day.  On 6/29 she had some nausea, tremor, and headache.  She received Ativan  and symptoms resolved.  By evaluation on 6/30 patient had only mild headache but no further tremor.  She endorsed feeling much improved and ready to discharge home.   Pyelonephritis - No urine cultures taken on admission.  Were sent on 6/28. Note she has been on antibiotics for over 24 hours at the time of this test and thus they may be negative. - Status post 4 days ceftriaxone .  Finish course with cefadroxil outpatient. - Questionable obstruction on CT, reviewed by urology.  No intervention needed   Hypertensive urgency -  Blood pressure 195/127 on arrival - Patient has been out of medications for over a year - Have started amlodipine .  Blood pressure much improved - Will need close outpatient follow-up with PCP for continued monitoring  Bacterial vaginosis - Seen on wet prep.  Continue Flagyl .   Anemia - Hemoglobin currently stable   Anxiety - Not on medications at home - Outpatient follow-up   Thrombocytopenia, chronic - Chronically as low as 70.  Near baseline presently - No acute signs of bleeding   Elevated LFTs - AST to ALT ratio 2:1, total bili elevated 2.5 - LFTs downtrending now.  They were within normal limits on arrival - Suspect acute alcoholic hepatitis - Viral hepatitis panel negative   Alcohol abuse - Patient endorses drinking 1 pint of Crown liquor every other day for the last 3 to 4 years.  Denies history of alcohol withdrawal. last drink: 6/26 - Received Ativan  for headache, vomiting, tremor on 6/29. - All symptoms resolved today.  Discharge Instructions  Discharge Instructions     Call MD for:  difficulty breathing, headache or visual disturbances   Complete by: As directed    Call MD for:  persistant dizziness or light-headedness   Complete by: As directed    Call MD for:  persistant nausea and vomiting   Complete by: As directed    Call MD for:  severe uncontrolled pain   Complete by: As directed    Call MD for:  temperature >100.4   Complete by: As directed    Diet general   Complete by: As directed    Discharge instructions   Complete by: As directed  Please take your blood pressure daily, keep a log.  Follow-up with your primary care physician to review this log and determine if you need additional blood pressure medications.  Please take your amlodipine  every day as prescribed  You were called in 2 different antibiotics. Metronidazole : This is for bacterial vaginosis, please complete this course. Cefadroxil: This is for your urine/kidney infection, please  take all pills as prescribed.  Return to the ER if you begin feeling shaky, fever, chest pain, or have a change in mentation   Increase activity slowly   Complete by: As directed       Allergies as of 03/26/2024       Reactions   Doxycycline  Swelling   Pt had facial swelling after receiving ms04, zofran , hydrocodone , tylenol , ivp dye, and doxycline. Unclear which was responsible agent.   Ibuprofen  Swelling   Facial swelling. Tolerates naproxen .   Iodinated Contrast Media Swelling   Pt had facial swelling after receiving ms04, zofran , hydrocodone , tylenol , ivp dye, and doxycline. Unclear which was responsible agent. Pt had facial swelling after receiving ms04, zofran , hydrocodone , tylenol , ivp dye, and doxycline. Unclear which was responsible agent.    Peanut Butter Flavoring Agent (non-screening) Anaphylaxis   Peanut-containing Drug Products Anaphylaxis   Tomato Anaphylaxis   Tylenol  [acetaminophen ] Hives        Medication List     STOP taking these medications    benzonatate  100 MG capsule Commonly known as: TESSALON    hydrochlorothiazide  12.5 MG tablet Commonly known as: HYDRODIURIL    predniSONE  10 MG (21) Tbpk tablet Commonly known as: STERAPRED UNI-PAK 21 TAB   promethazine -dextromethorphan 6.25-15 MG/5ML syrup Commonly known as: PROMETHAZINE -DM       TAKE these medications    amLODipine  10 MG tablet Commonly known as: NORVASC  Take 1 tablet (10 mg total) by mouth daily.   cefadroxil 500 MG capsule Commonly known as: DURICEF Take 2 capsules (1,000 mg total) by mouth 2 (two) times daily for 4 days.   metroNIDAZOLE  500 MG tablet Commonly known as: FLAGYL  Take 1 tablet (500 mg total) by mouth 2 (two) times daily for 4 days.        Follow-up Information     Dominique Shea FALCON, FNP Follow up.   Specialty: Nurse Practitioner Why: Hospital follow up Contact information: [address] [place] [address] [phone]                 Allergies  Allergen Reactions   Doxycycline  Swelling    Pt had facial swelling after receiving ms04, zofran , hydrocodone , tylenol , ivp dye, and doxycline. Unclear which was responsible agent.   Ibuprofen  Swelling    Facial swelling. Tolerates naproxen .   Iodinated Contrast Media Swelling    Pt had facial swelling after receiving ms04, zofran , hydrocodone , tylenol , ivp dye, and doxycline. Unclear which was responsible agent.  Pt had facial swelling after receiving ms04, zofran , hydrocodone , tylenol , ivp dye, and doxycline. Unclear which was responsible agent.    Peanut Butter Flavoring Agent (Non-Screening) Anaphylaxis   Peanut-Containing Drug Products Anaphylaxis   Tomato Anaphylaxis   Tylenol  [Acetaminophen ] Hives    Consultations:    Procedures/Studies: CT ABDOMEN PELVIS WO CONTRAST Result Date: 03/23/2024 CLINICAL DATA:  Left flank pain beginning yesterday. Nausea and vomiting. EXAM: CT ABDOMEN AND PELVIS WITHOUT CONTRAST TECHNIQUE: Multidetector CT imaging of the abdomen and pelvis was performed following the standard protocol without IV contrast. RADIATION DOSE REDUCTION: This exam was performed according to the departmental dose-optimization program which includes automated  exposure control, adjustment of the mA and/or kV according to patient size and/or use of iterative reconstruction technique. COMPARISON:  02/13/2017 FINDINGS: Lower chest: No acute findings. Hepatobiliary: No mass visualized on this unenhanced exam. Prior cholecystectomy. No evidence of biliary obstruction. Pancreas: No mass or inflammatory process visualized on this unenhanced exam. Spleen:  Within normal limits in size. Adrenals/Urinary tract: Mild asymmetric left renal swelling and perinephric stranding is seen. Moderate pelvicaliectasis is seen, without evidence ureteral calculus or dilatation. No renal calculi identified. Bladder is unremarkable for degree of filling. Stomach/Bowel: No evidence of  obstruction, inflammatory process, or abnormal fluid collections. Vascular/Lymphatic: No pathologically enlarged lymph nodes identified. No evidence of abdominal aortic aneurysm. Reproductive:  No mass or other significant abnormality. Other:  None. Musculoskeletal:  No suspicious bone lesions identified. IMPRESSION: Left renal swelling, perinephric stranding, and moderate left pelvicaliectasis. No ureteral dilatation or obstructing calculus seen. Differential diagnosis includes ascending urinary tract infection with pyelonephritis, recently passed calculus, and congenital left UPJ obstruction. Electronically Signed   By: Norleen DELENA Kil M.D.   On: 03/23/2024 12:10      Discharge Exam: Vitals:   03/26/24 0822 03/26/24 1150  BP: (!) 145/113 (!) 143/100  Pulse: 86 73  Resp: 16 16  Temp: 97.9 F (36.6 C) 98 F (36.7 C)  SpO2: 100% 100%   Vitals:   03/25/24 2324 03/26/24 0428 03/26/24 0822 03/26/24 1150  BP: 123/78 (!) 128/97 (!) 145/113 (!) 143/100  Pulse: 92 77 86 73  Resp: 18 16 16 16   Temp: 98 F (36.7 C) 97.6 F (36.4 C) 97.9 F (36.6 C) 98 F (36.7 C)  TempSrc: Oral Oral    SpO2: 98% 99% 100% 100%  Weight:      Height:        Constitutional:  Normal appearance. Non toxic-appearing.  HENT: Head Normocephalic and atraumatic.  Mucous membranes are moist.  Eyes:  Extraocular intact. Conjunctivae normal.  Cardiovascular: Rate and Rhythm: Normal rate and regular rhythm.  Pulmonary: Non labored, symmetric rise of chest wall.  Skin: warm and dry. not jaundiced.  Neurological: No focal deficit present. alert. Oriented.  Psychiatric: Mood and Affect congruent.    The results of significant diagnostics from this hospitalization (including imaging, microbiology, ancillary and laboratory) are listed below for reference.     Microbiology: Recent Results (from the past 240 hours)  Chlamydia/NGC rt PCR ([facility] only)     Status: None   Collection Time: 03/23/24 10:20 AM   Specimen:  Cervical/Vaginal swab  Result Value Ref Range Status   Specimen source GC/Chlam ENDOCERVICAL  Final   Chlamydia Tr NOT DETECTED NOT DETECTED Final   N gonorrhoeae NOT DETECTED NOT DETECTED Final    Comment: (NOTE) This CT/NG assay has not been evaluated in patients with a history of  hysterectomy. Performed at [facility], [address] [place], [place] [address]   Wet prep, genital     Status: Abnormal   Collection Time: 03/23/24 10:20 AM   Specimen: Cervical/Vaginal swab  Result Value Ref Range Status   Yeast Wet Prep HPF POC NONE SEEN NONE SEEN Final   Trich, Wet Prep NONE SEEN NONE SEEN Final   Clue Cells Wet Prep HPF POC PRESENT (A) NONE SEEN Final   WBC, Wet Prep HPF POC >=10 (A) <10 Final   Sperm NONE SEEN  Final    Comment: Performed at [facility], [address], [place], [place] [address]  Urine Culture (for pregnant, neutropenic or urologic patients or  patients with an indwelling urinary catheter)     Status: None   Collection Time: 03/24/24  2:00 PM   Specimen: Urine, Clean Catch  Result Value Ref Range Status   Specimen Description   Final    URINE, CLEAN CATCH Performed at [facility], [address], [place], [place] [address]    Special Requests   Final    NONE Performed at [facility], [address], [place], [place] [address]    Culture   Final    NO GROWTH Performed at [facility], [address] [place], [place] [address]    Report Status 03/25/2024 FINAL  Final     Labs: BNP (last 3 results) No results for input(s): BNP in the last 8760 hours. Basic Metabolic Panel: Recent Labs  Lab 03/23/24 0841 03/24/24 0535 03/25/24 0405 03/26/24 0510  NA 139 138 136 137  K 3.5 3.4* 3.1* 3.5  CL 108 108 107 105  CO2 23 23 23  21*  GLUCOSE 110* 101* 102* 103*  BUN 13 8 6 6   CREATININE 0.97 0.63 0.66 0.67  CALCIUM  8.8* 8.7* 8.6* 9.2  MG  --   --  2.1 2.0  PHOS  --   --  3.4 3.2   Liver Function  Tests: Recent Labs  Lab 03/23/24 0841 03/24/24 0535 03/25/24 0405 03/26/24 0510  AST 27 383* 113* 46*  ALT 13 195* 128* 88*  ALKPHOS 41 87 97 87  BILITOT 1.2 2.5* 1.7* 1.0  PROT 6.6 6.3* 7.0 7.4  ALBUMIN 3.9 3.3* 3.7 3.8   Recent Labs  Lab 03/23/24 0841  LIPASE 30   No results for input(s): AMMONIA in the last 168 hours. CBC: Recent Labs  Lab 03/23/24 0841 03/24/24 0535 03/25/24 0405 03/26/24 0510  WBC 13.3* 7.4 7.7 6.6  NEUTROABS  --   --  5.3 4.7  HGB 13.9 13.7 13.7 14.3  HCT 40.9 40.3 40.6 41.9  MCV 95.1 94.6 95.3 94.8  PLT 128* 101* 107* 133*   Cardiac Enzymes: No results for input(s): CKTOTAL, CKMB, CKMBINDEX, TROPONINI in the last 168 hours. BNP: Invalid input(s): POCBNP CBG: No results for input(s): GLUCAP in the last 168 hours. D-Dimer No results for input(s): DDIMER in the last 72 hours. Hgb A1c No results for input(s): HGBA1C in the last 72 hours. Lipid Profile No results for input(s): CHOL, HDL, LDLCALC, TRIG, CHOLHDL, LDLDIRECT in the last 72 hours. Thyroid  function studies No results for input(s): TSH, T4TOTAL, T3FREE, THYROIDAB in the last 72 hours.  Invalid input(s): FREET3 Anemia work up No results for input(s): VITAMINB12, FOLATE, FERRITIN, TIBC, IRON, RETICCTPCT in the last 72 hours. Urinalysis    Component Value Date/Time   COLORURINE ORANGE (A) 03/23/2024 0841   APPEARANCEUR CLOUDY (A) 03/23/2024 0841   APPEARANCEUR Clear 10/03/2014 1133   LABSPEC  03/23/2024 0841    TEST NOT REPORTED DUE TO COLOR INTERFERENCE OF URINE PIGMENT   LABSPEC 1.032 10/03/2014 1133   PHURINE  03/23/2024 0841    TEST NOT REPORTED DUE TO COLOR INTERFERENCE OF URINE PIGMENT   GLUCOSEU (A) 03/23/2024 0841    TEST NOT REPORTED DUE TO COLOR INTERFERENCE OF URINE PIGMENT   GLUCOSEU Negative 10/03/2014 1133   HGBUR (A) 03/23/2024 0841    TEST NOT REPORTED DUE TO COLOR INTERFERENCE OF URINE PIGMENT    BILIRUBINUR (A) 03/23/2024 0841    TEST NOT REPORTED DUE TO COLOR INTERFERENCE OF URINE PIGMENT   BILIRUBINUR Negative 10/03/2014 1133  KETONESUR (A) 03/23/2024 0841    TEST NOT REPORTED DUE TO COLOR INTERFERENCE OF URINE PIGMENT   PROTEINUR (A) 03/23/2024 0841    TEST NOT REPORTED DUE TO COLOR INTERFERENCE OF URINE PIGMENT   NITRITE (A) 03/23/2024 0841    TEST NOT REPORTED DUE TO COLOR INTERFERENCE OF URINE PIGMENT   LEUKOCYTESUR (A) 03/23/2024 0841    TEST NOT REPORTED DUE TO COLOR INTERFERENCE OF URINE PIGMENT   LEUKOCYTESUR 1+ 10/03/2014 1133   Sepsis Labs Recent Labs  Lab 03/23/24 0841 03/24/24 0535 03/25/24 0405 03/26/24 0510  WBC 13.3* 7.4 7.7 6.6   Microbiology Recent Results (from the past 240 hours)  Chlamydia/NGC rt PCR ([facility] only)     Status: None   Collection Time: 03/23/24 10:20 AM   Specimen: Cervical/Vaginal swab  Result Value Ref Range Status   Specimen source GC/Chlam ENDOCERVICAL  Final   Chlamydia Tr NOT DETECTED NOT DETECTED Final   N gonorrhoeae NOT DETECTED NOT DETECTED Final    Comment: (NOTE) This CT/NG assay has not been evaluated in patients with a history of  hysterectomy. Performed at [facility], [address] [place], [place] [address]   Wet prep, genital     Status: Abnormal   Collection Time: 03/23/24 10:20 AM   Specimen: Cervical/Vaginal swab  Result Value Ref Range Status   Yeast Wet Prep HPF POC NONE SEEN NONE SEEN Final   Trich, Wet Prep NONE SEEN NONE SEEN Final   Clue Cells Wet Prep HPF POC PRESENT (A) NONE SEEN Final   WBC, Wet Prep HPF POC >=10 (A) <10 Final   Sperm NONE SEEN  Final    Comment: Performed at [facility], [address] [place], [place] [address]  Urine Culture (for pregnant, neutropenic or urologic patients or patients with an indwelling urinary catheter)     Status: None   Collection Time: 03/24/24  2:00 PM   Specimen: Urine, Clean Catch  Result Value Ref Range Status   Specimen  Description   Final    URINE, CLEAN CATCH Performed at [facility], [address], [place], [place] [address]    Special Requests   Final    NONE Performed at [facility], [address], [place], [place] [address]    Culture   Final    NO GROWTH Performed at [facility], [address] [place], [place] [address]    Report Status 03/25/2024 FINAL  Final     Time coordinating discharge: 32 min   SIGNED: Lorane Poland, MD  [facility] 03/26/2024, 1:31 PM Pager   If 7PM-7AM, please contact night-coverage

## 2024-03-26 NOTE — TOC Initial Note (Signed)
 Transition of Care [facility]) - Initial/Assessment Note    Patient Details  Name: Dominique Shea MRN: [id number] Date of Birth: Jan 10, 1991  Transition of Care [facility]) CM/SW Contact:    Dalia GORMAN Fuse, RN Phone Number: 03/26/2024, 11:47 AM  Clinical Narrative:                 No TOC needs, please outreach to [facility] if needs are identified.      Patient Goals and CMS Choice            Expected Discharge Plan and Services                                              Prior Living Arrangements/Services                       Activities of Daily Living   ADL Screening (condition at time of admission) Independently performs ADLs?: Yes (appropriate for developmental age) Is the patient deaf or have difficulty hearing?: No Does the patient have difficulty seeing, even when wearing glasses/contacts?: No Does the patient have difficulty concentrating, remembering, or making decisions?: No  Permission Sought/Granted                  Emotional Assessment              Admission diagnosis:  Pyelonephritis [N12] BV (bacterial vaginosis) [N76.0, B96.89] Hypertensive urgency [I16.0] Acute pyelonephritis [N10] Patient Active Problem List   Diagnosis Date Noted   Acute pyelonephritis 03/23/2024   Primary hypertension 05/24/2023   Chronic ITP (idiopathic thrombocytopenia) (HCC) 05/24/2023   Cervical radiculopathy 02/09/2021   Iron deficiency anemia 03/23/2018   Elevated blood pressure affecting pregnancy in second trimester, antepartum 01/14/2018   Hemolysis, elevated liver enzymes, and low platelet (HELLP) syndrome during pregnancy, delivered, with postpartum complication 01/14/2018   Unwanted fertility 01/14/2018   Adjustment disorder with anxiety 09/08/2017   Gonorrhea affecting pregnancy in first trimester 08/24/2017   Thrombocytopenia (HCC) 07/12/2011   PCP:  Gareth Mliss FALCON, FNP Pharmacy:   [phone] [place], [place] - [address] [place] [address] Phone: [phone] Fax: [phone]  [id number] [place], [place] - [address] [place] [address] Phone: [phone] Fax: [phone]     Social Drivers of Health (SDOH) Social History: SDOH Screenings   Food Insecurity: No Food Insecurity (03/23/2024)  Housing: Low Risk  (03/23/2024)  Transportation Needs: No Transportation Needs (03/23/2024)  Utilities: Not At Risk (03/23/2024)  Alcohol Screen: Low Risk  (06/07/2023)  Depression (PHQ2-9): Low Risk  (06/07/2023)  Financial Resource Strain: Low Risk  (05/24/2023)  Physical Activity: Sufficiently Active (05/24/2023)  Social Connections: Patient Declined (03/23/2024)  Stress: No Stress Concern Present (05/24/2023)  Tobacco Use: High Risk (03/23/2024)  Health Literacy: Adequate Health Literacy (05/24/2023)   SDOH Interventions:     Readmission Risk Interventions     No data to display

## 2024-04-04 ENCOUNTER — Other Ambulatory Visit: Payer: Self-pay

## 2024-04-04 DIAGNOSIS — N131 Hydronephrosis with ureteral stricture, not elsewhere classified: Secondary | ICD-10-CM | POA: Diagnosis not present

## 2024-04-04 DIAGNOSIS — I10 Essential (primary) hypertension: Secondary | ICD-10-CM | POA: Diagnosis not present

## 2024-04-04 DIAGNOSIS — R109 Unspecified abdominal pain: Secondary | ICD-10-CM | POA: Diagnosis present

## 2024-04-04 DIAGNOSIS — N201 Calculus of ureter: Secondary | ICD-10-CM | POA: Diagnosis not present

## 2024-04-04 NOTE — ED Triage Notes (Signed)
 Pt reports left flank pain that began 1 week ago, pt was seen for same 1 week ago and admitted for kidney infection. Pt denies dysuria. Pt denies n/v.

## 2024-04-05 ENCOUNTER — Telehealth: Payer: Self-pay

## 2024-04-05 ENCOUNTER — Emergency Department

## 2024-04-05 ENCOUNTER — Emergency Department
Admission: EM | Admit: 2024-04-05 | Discharge: 2024-04-05 | Disposition: A | Discharge status: Home / Self Care | Attending: Emergency Medicine | Admitting: Emergency Medicine

## 2024-04-05 DIAGNOSIS — N135 Crossing vessel and stricture of ureter without hydronephrosis: Secondary | ICD-10-CM

## 2024-04-05 LAB — COMPREHENSIVE METABOLIC PANEL WITH GFR
ALT: 20 U/L (ref 0–44)
AST: 28 U/L (ref 15–41)
Albumin: 3.3 g/dL — ABNORMAL LOW (ref 3.5–5.0)
Alkaline Phosphatase: 44 U/L (ref 38–126)
Anion gap: 10 (ref 5–15)
BUN: 12 mg/dL (ref 6–20)
CO2: 20 mmol/L — ABNORMAL LOW (ref 22–32)
Calcium: 8.9 mg/dL (ref 8.9–10.3)
Chloride: 105 mmol/L (ref 98–111)
Creatinine, Ser: 0.84 mg/dL (ref 0.44–1.00)
GFR, Estimated: >60 mL/min (ref >60)
Glucose, Bld: 116 mg/dL — ABNORMAL HIGH (ref 70–99)
Potassium: 3.4 mmol/L — ABNORMAL LOW (ref 3.5–5.1)
Sodium: 135 mmol/L (ref 135–145)
Total Bilirubin: 0.8 mg/dL (ref 0.0–1.2)
Total Protein: 6.2 g/dL — ABNORMAL LOW (ref 6.5–8.1)

## 2024-04-05 LAB — URINALYSIS, ROUTINE W REFLEX MICROSCOPIC
Bilirubin Urine: NEGATIVE
Glucose, UA: NEGATIVE mg/dL
Hgb urine dipstick: NEGATIVE
Ketones, ur: NEGATIVE mg/dL
Leukocytes,Ua: NEGATIVE
Nitrite: NEGATIVE
Protein, ur: NEGATIVE mg/dL
Specific Gravity, Urine: 1.005 (ref 1.005–1.030)
pH: 7 (ref 5.0–8.0)

## 2024-04-05 LAB — CBC WITH DIFFERENTIAL/PLATELET
Abs Immature Granulocytes: 0.03 K/uL (ref 0.00–0.07)
Basophils Absolute: 0.1 K/uL (ref 0.0–0.1)
Basophils Relative: 1 %
Eosinophils Absolute: 0.2 K/uL (ref 0.0–0.5)
Eosinophils Relative: 2 %
HCT: 37.8 % (ref 36.0–46.0)
Hemoglobin: 13.2 g/dL (ref 12.0–15.0)
Immature Granulocytes: 0 %
Lymphocytes Relative: 28 %
Lymphs Abs: 2.8 K/uL (ref 0.7–4.0)
MCH: 32.9 pg (ref 26.0–34.0)
MCHC: 34.9 g/dL (ref 30.0–36.0)
MCV: 94.3 fL (ref 80.0–100.0)
Monocytes Absolute: 0.7 K/uL (ref 0.1–1.0)
Monocytes Relative: 7 %
Neutro Abs: 6.2 K/uL (ref 1.7–7.7)
Neutrophils Relative %: 62 %
Platelets: 169 K/uL (ref 150–400)
RBC: 4.01 MIL/uL (ref 3.87–5.11)
RDW: 12.9 % (ref 11.5–15.5)
Smear Review: NORMAL
WBC: 9.9 K/uL (ref 4.0–10.5)
nRBC: 0 % (ref 0.0–0.2)

## 2024-04-05 LAB — HCG, QUANTITATIVE, PREGNANCY: hCG, Beta Chain, Quant, S: <1 m[IU]/mL (ref <5)

## 2024-04-05 MED ORDER — OXYCODONE HCL 5 MG PO TABS
5.0000 mg | ORAL_TABLET | Freq: Once | ORAL | Status: AC
  Administered 2024-04-05: 5 mg via ORAL
  Filled 2024-04-05: qty 1

## 2024-04-05 MED ORDER — ONDANSETRON 4 MG PO TBDP
4.0000 mg | ORAL_TABLET | Freq: Once | ORAL | Status: AC
  Administered 2024-04-05: 4 mg via ORAL
  Filled 2024-04-05: qty 1

## 2024-04-05 MED ORDER — OXYCODONE HCL 5 MG PO TABS: 5.0000 mg | ORAL_TABLET | Freq: Three times a day (TID) | ORAL | 0 refills | Status: DC | PRN

## 2024-04-05 MED ORDER — KETOROLAC TROMETHAMINE 30 MG/ML IJ SOLN
30.0000 mg | Freq: Once | INTRAMUSCULAR | Status: AC
  Administered 2024-04-05: 30 mg via INTRAMUSCULAR
  Filled 2024-04-05: qty 1

## 2024-04-05 MED ORDER — MORPHINE SULFATE (PF) 4 MG/ML IV SOLN
4.0000 mg | Freq: Once | INTRAVENOUS | Status: AC
  Administered 2024-04-05: 4 mg via INTRAMUSCULAR
  Filled 2024-04-05: qty 1

## 2024-04-05 NOTE — ED Provider Notes (Signed)
 [facility] Provider Note    Event Date/Time   First MD Initiated Contact with Patient 04/05/24 [phone]     (approximate)   History   Flank Pain (/)   HPI  Mekiah Wahler is a 33 y.o. female   Past medical history of recent hospitalization for pyelonephritis and bacterial vaginosis presents emergency department with ongoing left flank pain.  She had 1 day of relief after being discharged in the hospital but since then she has had ongoing pain in the left flank area only.  She states that her urinary symptoms have resolved and is urinating without any issues now.  She denies nausea and vomiting.  She has not been taking any pain medications at home.  She has not met with a urologist.  Independent Historian contributed to assessment above: Her boyfriend is at bedside to corroborate information given above  External Medical Documents Reviewed: Hospital notes from her admission last week for pyelonephritis      Physical Exam   Triage Vital Signs: ED Triage Vitals  Encounter Vitals Group     BP 04/05/24 0002 (!) 162/110     Girls Systolic BP Percentile --      Girls Diastolic BP Percentile --      Boys Systolic BP Percentile --      Boys Diastolic BP Percentile --      Pulse Rate 04/05/24 0002 80     Resp 04/05/24 0002 20     Temp 04/05/24 0002 97.9 F (36.6 C)     Temp src --      SpO2 04/05/24 0002 100 %     Weight 04/04/24 2353 155 lb (70.3 kg)     Height 04/04/24 2353 5' 4 (1.626 m)     Head Circumference --      Peak Flow --      Pain Score 04/04/24 2353 10     Pain Loc --      Pain Education --      Exclude from Growth Chart --     Most recent vital signs: Vitals:   04/05/24 0002 04/05/24 0613  BP: (!) 162/110 (!) 158/92  Pulse: 80 78  Resp: 20 18  Temp: 97.9 F (36.6 C) 98.2 F (36.8 C)  SpO2: 100% 99%    General: Awake, no distress.  CV:  Good peripheral perfusion.  Resp:  Normal effort.  Abd:  No distention.   Other:  Resting in bed with hypertension otherwise vital signs normal, afebrile.  Benign abdominal exam deep palpation all quadrants.  Mild CVA tenderness on left side.   ED Results / Procedures / Treatments   Labs (all labs ordered are listed, but only abnormal results are displayed) Labs Reviewed  COMPREHENSIVE METABOLIC PANEL WITH GFR - Abnormal; Notable for the following components:      Result Value   Potassium 3.4 (*)    CO2 20 (*)    Glucose, Bld 116 (*)    Total Protein 6.2 (*)    Albumin 3.3 (*)    All other components within normal limits  URINALYSIS, ROUTINE W REFLEX MICROSCOPIC - Abnormal; Notable for the following components:   Color, Urine STRAW (*)    APPearance CLEAR (*)    All other components within normal limits  URINE CULTURE  CBC WITH DIFFERENTIAL/PLATELET  HCG, QUANTITATIVE, PREGNANCY     I ordered and reviewed the above labs they are notable for cell counts and electrolytes unremarkable.  Urinalysis without inflammatory  changes   RADIOLOGY I independently reviewed and interpreted CT of the abdomen pelvis and see left-sided hydronephrosis I also reviewed radiologist's formal read.   PROCEDURES:  Critical Care performed: No  Procedures   MEDICATIONS ORDERED IN ED: Medications  oxyCODONE  (Oxy IR/ROXICODONE ) immediate release tablet 5 mg (5 mg Oral Given 04/05/24 0027)  ondansetron  (ZOFRAN -ODT) disintegrating tablet 4 mg (4 mg Oral Given 04/05/24 0027)  ketorolac  (TORADOL ) 30 MG/ML injection 30 mg (30 mg Intramuscular Given 04/05/24 0444)  morphine  (PF) 4 MG/ML injection 4 mg (4 mg Intramuscular Given 04/05/24 0445)     IMPRESSION / MDM / ASSESSMENT AND PLAN / ED COURSE  I reviewed the triage vital signs and the nursing notes.                                Patient's presentation is most consistent with acute presentation with potential threat to life or bodily function.  Differential diagnosis includes, but is not limited to, pyelonephritis,  kidney stone, obstructive uropathy, intra-abdominal infection    MDM:    Patient with ongoing left flank pain after mostly resolution of her urinary infectious symptoms with a normal-appearing UA today.  However her CT scan does show evidence of a chronic appearing ureteral obstruction with hydronephrosis.  Fortunately her renal function on lab testing today is okay.  Her pain got much better with pain medications.  I think she needs to meet with urologist and I told her to call Dr. Francisca later this morning for a follow-up appointment.         FINAL CLINICAL IMPRESSION(S) / ED DIAGNOSES   Final diagnoses:  Obstruction of left ureter     Rx / DC Orders   ED Discharge Orders          Ordered    oxyCODONE  (ROXICODONE ) 5 MG immediate release tablet  Every 8 hours PRN        04/05/24 0602             Note:  This document was prepared using Dragon voice recognition software and may include unintentional dictation errors.    Cyrena Mylar, MD 04/05/24 [phone]

## 2024-04-05 NOTE — Telephone Encounter (Signed)
 Advised pt she will need to contact hospital and advise nurse to inform provider to send [facility] to [facility]

## 2024-04-05 NOTE — Telephone Encounter (Signed)
 Notified by ED clerk and nurse pharmacy to which oxycodone  was prescribed last night for pain control is currently out of stock.  [facility] [address] [facility] [place] --performed.  Indication is urolithiasis.

## 2024-04-05 NOTE — Telephone Encounter (Signed)
 Copied from CRM [id number]. Topic: Clinical - Medication Question >> Apr 05, 2024 12:38 PM Yolanda T wrote: Reason for CRM: patient called stated the [facility] [address] does not carry the oxyCODONE  (ROXICODONE ) 5 MG immediate release tablet so the script would need to be sent to a different pharmacy.

## 2024-04-05 NOTE — Discharge Instructions (Signed)
 Your urine infection appears to be resolved at this time but should still finish those antibiotics you were prescribed originally.  The CT scan of your abdomen does show what appears to be an obstruction of your ureter for which you should call Dr. Francisca who is a urologist and a specialist in this area for further evaluation and treatment options.  Take acetaminophen  650 mg every 6 hours for pain.  Take Naprosyn  500 mg every 12 hours and make sure to take with food.  For severe pain you may take oxycodone  as prescribed .

## 2024-04-06 LAB — URINE CULTURE: Culture: 5000 — AB

## 2024-04-07 ENCOUNTER — Telehealth: Payer: Self-pay | Admitting: Emergency Medicine

## 2024-04-07 MED ORDER — OXYCODONE HCL 5 MG PO TABS: 5.0000 mg | ORAL_TABLET | Freq: Four times a day (QID) | ORAL | 0 refills | Status: AC | PRN

## 2024-04-07 NOTE — Telephone Encounter (Signed)
 Need to [facility] [address] cancelled prior rx due to [facility]NS tablet stock.

## 2024-04-09 ENCOUNTER — Encounter: Payer: Self-pay | Admitting: Oncology

## 2024-04-17 ENCOUNTER — Emergency Department
Admission: EM | Admit: 2024-04-17 | Discharge: 2024-04-17 | Disposition: A | Discharge status: Home / Self Care | Attending: Emergency Medicine | Admitting: Emergency Medicine

## 2024-04-17 ENCOUNTER — Other Ambulatory Visit: Payer: Self-pay

## 2024-04-17 ENCOUNTER — Encounter: Payer: Self-pay | Admitting: Nurse Practitioner

## 2024-04-17 ENCOUNTER — Emergency Department

## 2024-04-17 ENCOUNTER — Encounter: Payer: Self-pay | Admitting: Emergency Medicine

## 2024-04-17 DIAGNOSIS — N760 Acute vaginitis: Secondary | ICD-10-CM | POA: Diagnosis not present

## 2024-04-17 DIAGNOSIS — B9689 Other specified bacterial agents as the cause of diseases classified elsewhere: Secondary | ICD-10-CM | POA: Insufficient documentation

## 2024-04-17 DIAGNOSIS — N130 Hydronephrosis with ureteropelvic junction obstruction: Secondary | ICD-10-CM | POA: Diagnosis not present

## 2024-04-17 DIAGNOSIS — R1032 Left lower quadrant pain: Secondary | ICD-10-CM

## 2024-04-17 DIAGNOSIS — R109 Unspecified abdominal pain: Secondary | ICD-10-CM

## 2024-04-17 DIAGNOSIS — N135 Crossing vessel and stricture of ureter without hydronephrosis: Secondary | ICD-10-CM

## 2024-04-17 LAB — BASIC METABOLIC PANEL WITH GFR
Anion gap: 10 (ref 5–15)
BUN: 15 mg/dL (ref 6–20)
CO2: 21 mmol/L — ABNORMAL LOW (ref 22–32)
Calcium: 9.1 mg/dL (ref 8.9–10.3)
Chloride: 104 mmol/L (ref 98–111)
Creatinine, Ser: 1.27 mg/dL — ABNORMAL HIGH (ref 0.44–1.00)
GFR, Estimated: 58 mL/min — ABNORMAL LOW (ref >60)
Glucose, Bld: 103 mg/dL — ABNORMAL HIGH (ref 70–99)
Potassium: 3.9 mmol/L (ref 3.5–5.1)
Sodium: 135 mmol/L (ref 135–145)

## 2024-04-17 LAB — URINALYSIS, ROUTINE W REFLEX MICROSCOPIC
Bilirubin Urine: NEGATIVE
Glucose, UA: NEGATIVE mg/dL
Hgb urine dipstick: NEGATIVE
Ketones, ur: NEGATIVE mg/dL
Leukocytes,Ua: NEGATIVE
Nitrite: NEGATIVE
Protein, ur: NEGATIVE mg/dL
Specific Gravity, Urine: 1.016 (ref 1.005–1.030)
pH: 6 (ref 5.0–8.0)

## 2024-04-17 LAB — CBC
HCT: 42.5 % (ref 36.0–46.0)
Hemoglobin: 14.6 g/dL (ref 12.0–15.0)
MCH: 31.9 pg (ref 26.0–34.0)
MCHC: 34.4 g/dL (ref 30.0–36.0)
MCV: 93 fL (ref 80.0–100.0)
Platelets: 104 K/uL — ABNORMAL LOW (ref 150–400)
RBC: 4.57 MIL/uL (ref 3.87–5.11)
RDW: 12.5 % (ref 11.5–15.5)
WBC: 10.4 K/uL (ref 4.0–10.5)
nRBC: 0 % (ref 0.0–0.2)

## 2024-04-17 LAB — WET PREP, GENITAL
Sperm: NONE SEEN
Trich, Wet Prep: NONE SEEN
WBC, Wet Prep HPF POC: <10 (ref <10)
Yeast Wet Prep HPF POC: NONE SEEN

## 2024-04-17 LAB — POC URINE PREG, ED: Preg Test, Ur: NEGATIVE

## 2024-04-17 LAB — HEPATIC FUNCTION PANEL
ALT: 12 U/L (ref 0–44)
AST: 21 U/L (ref 15–41)
Albumin: 3.8 g/dL (ref 3.5–5.0)
Alkaline Phosphatase: 45 U/L (ref 38–126)
Bilirubin, Direct: 0.2 mg/dL (ref 0.0–0.2)
Indirect Bilirubin: 1.6 mg/dL — ABNORMAL HIGH (ref 0.3–0.9)
Total Bilirubin: 1.8 mg/dL — ABNORMAL HIGH (ref 0.0–1.2)
Total Protein: 7 g/dL (ref 6.5–8.1)

## 2024-04-17 LAB — CHLAMYDIA/NGC RT PCR (ARMC ONLY)
Chlamydia Tr: NOT DETECTED
N gonorrhoeae: NOT DETECTED

## 2024-04-17 MED ORDER — CLINDAMYCIN PHOSPHATE 2 % VA GEL: 5.0000 g | Freq: Every day | VAGINAL | 0 refills | Status: AC

## 2024-04-17 MED ORDER — MORPHINE SULFATE (PF) 4 MG/ML IV SOLN
4.0000 mg | Freq: Once | INTRAVENOUS | Status: AC
  Administered 2024-04-17: 4 mg via INTRAVENOUS
  Filled 2024-04-17: qty 1

## 2024-04-17 MED ORDER — DICYCLOMINE HCL 10 MG PO CAPS
20.0000 mg | ORAL_CAPSULE | Freq: Once | ORAL | Status: AC
  Administered 2024-04-17: 20 mg via ORAL
  Filled 2024-04-17: qty 2

## 2024-04-17 MED ORDER — DICYCLOMINE HCL 20 MG PO TABS: 20.0000 mg | ORAL_TABLET | Freq: Four times a day (QID) | ORAL | 0 refills | Status: DC | PRN

## 2024-04-17 MED ORDER — ONDANSETRON HCL 4 MG PO TABS: 4.0000 mg | ORAL_TABLET | Freq: Four times a day (QID) | ORAL | 0 refills | Status: AC | PRN

## 2024-04-17 MED ORDER — SODIUM CHLORIDE 0.9 % IV BOLUS
1000.0000 mL | Freq: Once | INTRAVENOUS | Status: AC
  Administered 2024-04-17: 1000 mL via INTRAVENOUS

## 2024-04-17 MED ORDER — OXYCODONE HCL 5 MG PO TABS: 5.0000 mg | ORAL_TABLET | Freq: Four times a day (QID) | ORAL | 0 refills | Status: AC | PRN

## 2024-04-17 MED ORDER — HYDROMORPHONE HCL 1 MG/ML IJ SOLN
0.5000 mg | Freq: Once | INTRAMUSCULAR | Status: AC
  Administered 2024-04-17: 0.5 mg via INTRAVENOUS
  Filled 2024-04-17: qty 0.5

## 2024-04-17 MED ORDER — ONDANSETRON HCL 4 MG/2ML IJ SOLN
4.0000 mg | Freq: Once | INTRAMUSCULAR | Status: AC
  Administered 2024-04-17: 4 mg via INTRAVENOUS
  Filled 2024-04-17: qty 2

## 2024-04-17 NOTE — Discharge Instructions (Addendum)
 Your testing today continues to show an obstruction of your urinary pathway which I suspect is contributing to your pain.  I have spoken to our urologist on-call and you will be contacted by the urology office to discuss a closer follow-up plan.  Your testing also showed persistent bacterial vaginosis.  I sent a prescription for a antibiotic gel to your pharmacy for this.  In addition, I have sent additional pain and nausea medication to your pharmacy.    I have sent a prescription for a narcotic pain medicine for severe breathrough pain. Do not drink alcohol, drive or participate in any other potentially dangerous activities while taking this medication as it may make you sleepy. Do not take this medication with any other sedating medications, either prescription or over-the-counter.  This medication is intended for your use only - do not give any to anyone else and keep it in a secure place where nobody else, especially children, have access to it.  It can also cause or worsen constipation, so you may want to consider taking an over-the-counter stool softener while you are taking this medication.

## 2024-04-17 NOTE — ED Triage Notes (Signed)
 Patient to ED via POV for left sided flank pain. States intermittent for a few weeks. Denies difficulty urinating or V/D, states she does have some nausea.

## 2024-04-17 NOTE — ED Provider Notes (Signed)
 [facility] Provider Note    Event Date/Time   First MD Initiated Contact with Patient 04/17/24 [phone]     (approximate)   History   Flank Pain   HPI  Shelsea Hangartner is a 33 year old female with recent admission for pyelonephritis presenting to the emergency department for evaluation of left-sided flank pain.  Patient seen recently for similar as below, but reports that her pain got significantly worse today leading her to present back to the ER.  Says that the location of the pain remains in her left lower quadrant and back.  Does report associated nausea without vomiting.  Has a follow-up appointment with urology for August 13 which was the soonest appointment they had available.  Does report some increased vaginal discharge.  Denies concerns for STDs.  I reviewed her DC summary from 03/27/2023.  At that time, patient presented with left lower quadrant and left flank pain.  She was found to have left renal swelling which could be reflective of pyelonephritis, recently passed stone, or congenital UPJ obstruction.  She was treated with IV antibiotics with improvement in symptoms, discharged continue oral prescription course.  I reviewed her ER visit from 04/05/2024.  At that time she presented with recurrent flank pain.  She had a repeat CT demonstrating chronic appearing ureteral obstruction with hydronephrosis with reassuring creatinine and she was discharged with plans for outpatient follow-up with urology.      Physical Exam   Triage Vital Signs: ED Triage Vitals  Encounter Vitals Group     BP 04/17/24 0855 (!) 144/119     Girls Systolic BP Percentile --      Girls Diastolic BP Percentile --      Boys Systolic BP Percentile --      Boys Diastolic BP Percentile --      Pulse Rate 04/17/24 0855 75     Resp 04/17/24 0855 17     Temp 04/17/24 0855 97.7 F (36.5 C)     Temp Source 04/17/24 0855 Oral     SpO2 04/17/24 0855 99 %     Weight 04/17/24  0854 154 lb (69.9 kg)     Height 04/17/24 0854 5' 4 (1.626 m)     Head Circumference --      Peak Flow --      Pain Score 04/17/24 0854 10     Pain Loc --      Pain Education --      Exclude from Growth Chart --     Most recent vital signs: Vitals:   04/17/24 0855 04/17/24 1302  BP: (!) 144/119 (!) 138/92  Pulse: 75 72  Resp: 17 17  Temp: 97.7 F (36.5 C) 97.7 F (36.5 C)  SpO2: 99% 99%     General: Awake, interactive  CV:  Regular rate, good peripheral perfusion.  Resp:  Unlabored respirations.  Abd:  Nondistended, tenderness palpation in the left lower quadrant extending into the left flank area Neuro:  Symmetric facial movement, fluid speech   ED Results / Procedures / Treatments   Labs (all labs ordered are listed, but only abnormal results are displayed) Labs Reviewed  WET PREP, GENITAL - Abnormal; Notable for the following components:      Result Value   Clue Cells Wet Prep HPF POC PRESENT (*)    All other components within normal limits  URINALYSIS, ROUTINE W REFLEX MICROSCOPIC - Abnormal; Notable for the following components:   Color, Urine YELLOW (*)  APPearance HAZY (*)    All other components within normal limits  BASIC METABOLIC PANEL WITH GFR - Abnormal; Notable for the following components:   CO2 21 (*)    Glucose, Bld 103 (*)    Creatinine, Ser 1.27 (*)    GFR, Estimated 58 (*)    All other components within normal limits  CBC - Abnormal; Notable for the following components:   Platelets 104 (*)    All other components within normal limits  HEPATIC FUNCTION PANEL - Abnormal; Notable for the following components:   Total Bilirubin 1.8 (*)    Indirect Bilirubin 1.6 (*)    All other components within normal limits  CHLAMYDIA/NGC RT PCR ([facility] ONLY)            POC URINE PREG, ED     EKG EKG independently reviewed and interpreted by myself demonstrates:    RADIOLOGY Imaging independently reviewed and interpreted by myself demonstrates:   CT abdomen pelvis without contrast with stable hydronephrosis Pelvic ultrasound without acute findings  Formal Radiology Read:  US  PELVIC COMPLETE W TRANSVAGINAL AND TORSION R/O Result Date: 04/17/2024 CLINICAL DATA:  Left lower quadrant abdominal pain. EXAM: TRANSABDOMINAL AND TRANSVAGINAL ULTRASOUND OF PELVIS DOPPLER ULTRASOUND OF OVARIES TECHNIQUE: Both transabdominal and transvaginal ultrasound examinations of the pelvis were performed. Transabdominal technique was performed for global imaging of the pelvis including uterus, ovaries, adnexal regions, and pelvic cul-de-sac. It was necessary to proceed with endovaginal exam following the transabdominal exam to visualize the endometrium and ovaries. Color and duplex Doppler ultrasound was utilized to evaluate blood flow to the ovaries. COMPARISON:  CT scan of same day. FINDINGS: Uterus Measurements: 9.5 x 5.9 x 5.2 cm = volume: 152 mL. No fibroids or other mass visualized. Endometrium Thickness: 15 mm which is within normal limits. No focal abnormality visualized. Right ovary Measurements: 2.3 x 3.1 x 1.8 cm = volume: 6 mL. Normal appearance/no adnexal mass. Left ovary Measurements: 2.9 x 3.0 x 1.9 cm = volume: 9 mL. Normal appearance/no adnexal mass. Pulsed Doppler evaluation of both ovaries demonstrates normal low-resistance arterial and venous waveforms. Other findings Trace free fluid is noted which most likely is physiologic. Patient is reportedly status post bilateral salpingectomy. IMPRESSION: No evidence of ovarian mass or torsion. No definite abnormality seen in the pelvis. Electronically Signed   By: Lynwood Landy Raddle M.D.   On: 04/17/2024 12:27   CT ABDOMEN PELVIS WO CONTRAST Result Date: 04/17/2024 CLINICAL DATA:  Abnormal indibulin is LEFT lower quadrant pain. LEFT flank pain. EXAM: CT ABDOMEN AND PELVIS WITHOUT CONTRAST TECHNIQUE: Multidetector CT imaging of the abdomen and pelvis was performed following the standard protocol without IV  contrast. RADIATION DOSE REDUCTION: This exam was performed according to the departmental dose-optimization program which includes automated exposure control, adjustment of the mA and/or kV according to patient size and/or use of iterative reconstruction technique. COMPARISON:  04/05/2024 FINDINGS: Lower chest: Small nodule in the RIGHT lower lobe measuring 6 mm not changed comparison exam. Hepatobiliary: No focal hepatic lesion. Postcholecystectomy. No biliary dilatation. Pancreas: Pancreas is normal. No ductal dilatation. No pancreatic inflammation. Spleen: Normal spleen Adrenals/urinary tract: Adrenal glands normal. Again demonstrated LEFT hydronephrosis and dilatation of the LEFT renal pelvis. The dilatation appears to probably. At the ureteropelvic junction. The distal LEFT ureter is normal. No obstructing calculi. No nephrolithiasis. No bladder calculi. Stomach/Bowel: Stomach, small bowel, appendix, and cecum are normal. The colon and rectosigmoid colon are normal. Vascular/Lymphatic: Abdominal aorta is normal caliber. No periportal or retroperitoneal adenopathy.  No pelvic adenopathy. Reproductive: Uterus and adnexa unremarkable. Other: No free fluid Musculoskeletal: No aggressive osseous lesion. IMPRESSION: 1. LEFT hydronephrosis and dilatation of the LEFT renal pelvis. No obstructing calculi. Findings suggest LEFT UPJ obstruction. No change from multiple recent exams. 2. No nephrolithiasis. 3. Normal bowel. 4. Stable RIGHT lower lobe pulmonary nodule. Electronically Signed   By: Jackquline Boxer M.D.   On: 04/17/2024 10:22    PROCEDURES:  Critical Care performed: No  Procedures   MEDICATIONS ORDERED IN ED: Medications  dicyclomine  (BENTYL ) capsule 20 mg (has no administration in time range)  morphine  (PF) 4 MG/ML injection 4 mg (4 mg Intravenous Given 04/17/24 1048)  ondansetron  (ZOFRAN ) injection 4 mg (4 mg Intravenous Given 04/17/24 1047)  sodium chloride  0.9 % bolus 1,000 mL (1,000 mLs  Intravenous New Bag/Given 04/17/24 1047)  HYDROmorphone  (DILAUDID ) injection 0.5 mg (0.5 mg Intravenous Given 04/17/24 1347)     IMPRESSION / MDM / ASSESSMENT AND PLAN / ED COURSE  I reviewed the triage vital signs and the nursing notes.  Differential diagnosis includes, but is not limited to, sequela from recently identified UPJ obstruction, renal stone, PID, BV, other GU pathology, lower suspicion other acute intra-abdominal process given recent reassuring imaging, UTI/pyelonephritis  Patient's presentation is most consistent with acute presentation with potential threat to life or bodily function.  33 year old female presenting with worsening left-sided flank pain with recent visits for similar thought to be related to pyelonephritis and chronic UVJ obstruction.  Multiple recent CTs, but patient reports her pain is significantly worse today, so we will repeat.  Also we will repeat her pelvic exam and treat symptomatically.  Labs with reassuring CBC, BMP with mild creatinine worsening at 1.27, recent baseline around 0.6 0.8.  LFTs without significant derangement.  Urine without evidence of infection.  Negative GC.  Wet prep with persistent clue cells.  CT without significant change, does demonstrate ongoing hydronephrosis.  Pelvic ultrasound without evidence of torsion or other acute findings.  Given multiple recent visits, worsening pain, Case was reviewed with Dr. Twylla with urology.  He notes that patient may require procedural intervention given her symptoms.  She does have follow-up arranged with Dr. Francisca, but he is unsure if he performs the procedure that the patient will likely need.  He notes that he will reach out to Dr. Francisca to see if patient can follow-up locally with a sooner appointment or if she will need to have follow-up arranged in [place].  Urology team will contact patient directly.  I did discuss the plan with the patient and she is comfortable with this, but did have  some ongoing pain here.  Has NSAID allergy documented, will trial Dilaudid  and Bentyl .  Did have improvement with oxycodone  recently, will plan for DC with this.  Do also note that her wet prep demonstrates persistent clue cells, denies prior history of BV before a few weeks ago.  Will trial clindamycin  ointment.  Strict return precautions provided.  Patient discharged stable condition.      FINAL CLINICAL IMPRESSION(S) / ED DIAGNOSES   Final diagnoses:  LLQ pain  UPJ (ureteropelvic junction) obstruction  Left flank pain  BV (bacterial vaginosis)     Rx / DC Orders   ED Discharge Orders          Ordered    oxyCODONE  (ROXICODONE ) 5 MG immediate release tablet  Every 6 hours PRN        04/17/24 1402    ondansetron  (ZOFRAN ) 4 MG tablet  Every  6 hours PRN        04/17/24 1402    dicyclomine  (BENTYL ) 20 MG tablet  Every 6 hours PRN        04/17/24 1402             Note:  This document was prepared using Dragon voice recognition software and may include unintentional dictation errors.   Levander Slate, MD 04/17/24 [phone]

## 2024-05-05 ENCOUNTER — Telehealth: Admitting: Physician Assistant

## 2024-05-05 DIAGNOSIS — Z91199 Patient's noncompliance with other medical treatment and regimen due to unspecified reason: Secondary | ICD-10-CM

## 2024-05-05 NOTE — Progress Notes (Signed)
 The patient arrived early to video and left, then no-showed for appointment at appt time despite this provider sending direct link with no response and waiting for at least 10 minutes from appointment time for patient to join. They will be marked as a NS for this appointment/time.   Elsie Velma Lunger, PA-C

## 2024-05-09 ENCOUNTER — Encounter: Payer: Self-pay | Admitting: Oncology

## 2024-05-09 ENCOUNTER — Ambulatory Visit (INDEPENDENT_AMBULATORY_CARE_PROVIDER_SITE_OTHER): Admitting: Urology

## 2024-05-09 VITALS — BP 153/117 | HR 68 | Wt 154.0 lb

## 2024-05-09 DIAGNOSIS — N135 Crossing vessel and stricture of ureter without hydronephrosis: Secondary | ICD-10-CM

## 2024-05-09 DIAGNOSIS — N200 Calculus of kidney: Secondary | ICD-10-CM | POA: Diagnosis not present

## 2024-05-09 MED ORDER — TRAMADOL HCL 50 MG PO TABS: 25.0000 mg | ORAL_TABLET | Freq: Four times a day (QID) | ORAL | 0 refills | Status: DC | PRN

## 2024-05-09 MED ORDER — ONDANSETRON HCL 4 MG PO TABS: 4.0000 mg | ORAL_TABLET | Freq: Three times a day (TID) | ORAL | 0 refills | Status: DC | PRN

## 2024-05-09 NOTE — Patient Instructions (Signed)
 Robot-Assisted Ureterolysis and Pyeloplasty  Robotic-assisted ureterolysis and pyeloplasty are procedures to relieve a blockage in the ureter or the ureteropelvic junction (UPJ). Ureters are the tubes that connect the kidneys to the bladder. The UPJ connects the ureter to part of the kidney. These procedures are done with a small camera and light (laparoscope) and tools that are attached to robotic arms. The surgeon controls the robotic arms to help do all or part of the surgery. Ureterolysis may be done when a ureter is blocked by scar tissue pushing against it. The scar tissue may be from a past surgery or infection. Pyeloplasty may be done when the ureter is blocked by something inside the UPJ. This may be caused by scar tissue, a fluid-filled sac (cyst), a growth, or a kidney stone. It may also be caused by a blood vessel crossing in front of the UPJ. Tell a health care provider about: Any allergies you have. All medicines you are taking, including vitamins, herbs, eye drops, creams, and over-the-counter medicines. Any problems you or family members have had with anesthesia. Any bleeding problems you have. Any surgeries you have had. Any medical conditions you have. Whether you are pregnant or may be pregnant. What are the risks? Your health care provider will talk with you about risks. These may include: Infection. Bleeding. Allergic reactions to medicines. Damage to other structures or organs. The blockage coming back. Pee (urine) leakage. Blood clots. The need to switch to an open surgery. An open surgery involves a bigger incision. It is not done with robotic arms. What happens before the procedure? When to stop eating and drinking Follow instructions from your provider about what you may eat and drink. These may include: 8 hours before your procedure Stop eating most foods. Do not eat meat, fried foods, or fatty foods. Eat only light foods, such as toast or crackers. All liquids  are okay except energy drinks and alcohol. 6 hours before your procedure Stop eating. Drink only clear liquids, such as water, clear fruit juice, black coffee, plain tea, and sports drinks. Do not drink energy drinks or alcohol. 2 hours before your procedure Stop drinking all liquids. You may be allowed to take medicines with small sips of water. If you do not follow your provider's instructions, your procedure may be delayed or canceled. Medicines Ask your provider about: Changing or stopping your regular medicines. These include any diabetes medicines or blood thinners you take. Taking medicines such as aspirin and ibuprofen . These medicines can thin your blood. Do not take them unless your provider tells you to. Taking over-the-counter medicines, vitamins, herbs, and supplements. Surgery safety Ask your provider: How your surgery site will be marked. What steps will be taken to help prevent infection. These steps may include: Removing hair at the surgery site. Washing skin with a soap that kills germs. Taking antibiotics. General instructions If you will be going home right after the procedure, plan to have a responsible adult: Take you home from the hospital. You will not be allowed to drive. Care for you for the time you are told. What happens during the procedure? An IV will be inserted into one of your veins. You will be given: A sedative. This helps you relax. Anesthesia. This keeps you from feeling pain. It will make you fall asleep for surgery. A soft tube (catheter) will be put into your bladder to drain your pee. Some small incisions will be made in your abdomen. A gas will be used to fill your  abdomen. This will make it expand so the area can be seen more clearly. The laparoscope and other tools will be put through the incisions. For a pyeloplasty: An incision will be made in your ureter or UPJ. The blockage will be removed. If a blood vessel is causing the problem,  it will be moved. In some cases, part of your ureter may be taken out. You may need to have a small mesh tube (stent) put in to stop pee from draining out of the incision. Incisions will be closed with stitches (sutures). For a ureterolysis, your ureter will be moved away from what is causing the blockage. You may not need a stent. If the blockage is caused by kidney stones, these will be removed. A drainage tube may be put in your abdomen. The incisions in your abdomen will be closed with sutures, skin glue, or tape strips. They may be covered with bandages (dressings). The procedure may vary among providers and hospitals. What happens after the procedure? Your blood pressure, heart rate, breathing rate, and blood oxygen level will be monitored until you leave the hospital. You may keep getting fluids and medicine through an IV. You should walk around as soon as you can. Wear compression stockings as told by your provider. These stockings help to prevent blood clots and reduce swelling in your legs. You may have a catheter to drain your pee. You may also have a tube to drain fluid from where you had surgery. This information is not intended to replace advice given to you by your health care provider. Make sure you discuss any questions you have with your health care provider. Document Revised: 06/22/2022 Document Reviewed: 06/22/2022 Elsevier Patient Education  2024 [employer].

## 2024-05-09 NOTE — Progress Notes (Signed)
 05/09/24 9:48 AM   Dominique Shea 09-16-1991 [id number]  CC: Left flank pain, left hydronephrosis  HPI: 33 year old female who presents with left flank pain that has been present for at least 2 to 3 months.  This has been evaluated extensively in the ER and she has had multiple CT scans.  Initial CT in June 2025 showed mild to moderate hydronephrosis, no ureteral dilation, no evidence of stone.  Follow-up CT scans x 2 in July 2025 were consistent with left hydronephrosis with decompressed left ureter, and a crossing vessel suggestive of left UPJ obstruction.  She thinks her left-sided flank pain is worse when she consumes a large amount of fluids.  She cannot take NSAIDs secondary to allergic reaction.  She also has had some nausea and vomiting.  She denies any fevers, chills, dysuria, or urinary symptoms.  Prior CT scan in May 2018 was benign with no hydronephrosis.  Past surgical history notable for laparoscopic cholecystectomy.  Renal function had been normal, but most recent creatinine from 04/17/2024 was mildly elevated at 1.27 with eGFR 58.   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 +   Anemia    Anxiety    HELLP (hemolytic anemia/elev liver enzymes/low platelets in pregnancy)    History of gonorrhea 2014   Migraine    Thrombocytopenia (HCC)    DURING FIRST PREG    Surgical History: Past Surgical History:  Procedure Laterality Date   ANTERIOR CERVICAL DECOMP/DISCECTOMY FUSION N/A 02/09/2021   Procedure: C4/5, C5/6 ANTERIOR CERVICAL DECOMPRESSION/DISCECTOMY FUSION;  Surgeon: Bluford Standing, MD;  Location: [facility] ORS;  Service: Neurosurgery;  Laterality: N/A;   CHOLECYSTECTOMY  2012   [facility]   ECTOPIC PREGNANCY SURGERY     FRACTURE SURGERY     RIGHT MIDDLE FINGER    Family History: Family History  Problem Relation Age of Onset   Hypertension Mother    Migraines Mother    Hypertension Father    Migraines Father     Migraines Sister    Migraines Brother     Social History:  reports that she has been smoking cigarettes. She has a 4 pack-year smoking history. She has been exposed to tobacco smoke. She has never used smokeless tobacco. She reports current alcohol use of about 12.0 standard drinks of alcohol per week. She reports current drug use. Drug: Marijuana.  Physical Exam: BP (!) 153/117 (BP Location: Left Arm, Patient Position: Sitting, Cuff Size: Normal)   Pulse 68   Wt 154 lb (69.9 kg)   LMP 03/28/2024   SpO2 100%   BMI 26.43 kg/m    Constitutional:  Alert and oriented, No acute distress. Cardiovascular: No clubbing, cyanosis, or edema. Respiratory: Normal respiratory effort, no increased work of breathing. GI: Abdomen is soft, nontender, nondistended, no abdominal masses   Laboratory Data: Reviewed, see HPI  Pertinent Imaging: I have personally viewed and interpreted the multiple CT scans consistent with left hydronephrosis and UPJ obstruction likely from a lower pole crossing vessel.  Assessment & Plan:   33 year old female with new left hydronephrosis and decompressed left ureter, no evidence of stones on CT, but imaging suggestive of a left lower pole crossing vessel and UPJ obstruction.  We discussed this is typically managed with robotic pyeloplasty.  Will try to avoid ureteral stent and nephrostomy tube until that time.  Renal scan ordered for further evaluation of differential function.  Risks and benefits of surgery discussed at length.  We reviewed this is typically a 2 to 4-hour inpatient procedure requiring 1 night hospital stay, risk of bleeding, infection, damage to bowel or surrounding structures, urine leak, need for stent placement for 6 weeks, possible risk of stricture or recurrence in the future.  We reviewed importance of trying to avoid drainage with ureteral stent or nephrostomy tube that can typically cause inflammatory response and make surgical reconstruction more  challenging.  Return precautions were discussed including high fever or uncontrolled flank pain.  -Renal scan ordered -Tramadol  for pain control, Zofran  for nausea -Follow-up scheduled next week with Dr Georganne to discuss robotic pyeloplasty and schedule robotic pyeloplasty   Redell Burnet, MD 05/09/2024  [facility] [address] [place], [place] [address] [phone]

## 2024-05-10 NOTE — Progress Notes (Signed)
   05/24/2024 9:01 AM   Dominique Shea [birth year] [id number]  Reason for visit: Follow up left UPJO   HPI: Recently evaluated by Dr. Francisca, Aug 2025 Several month history of intermittent Left flank pain, ER visits w/ multiple CT scans CT scans in June 2025 showed moderate Left hydronephrosis, no ureteral dilation, appearance of Left crossing vessel, suggestive of UPJO. Intervals scans x2 over the summer characterized similar findings.   +Dietl's crises.   Past surgical history notable for laparoscopic cholecystectomy. Renal function had been normal, but most recent creatinine from 04/17/2024 was mildly elevated at 1.27 with eGFR 58.   Today, pain is fairly constant x2 months. Intermittent spikes with fluids / positional changes. She has been taking tramadol . Has not taken tylenol  /NSAIDS due to ?hx of remote SEs - hives and or eye swelling  Lives alone with 3 small children. Mother works nightshifts and can offer some support, same with a friend who can stay with her if surgery needed  Physical Exam: BP (!) 148/108 (BP Location: Left Arm, Patient Position: Sitting, Cuff Size: Normal)   Pulse 99   Ht 5' 4 (1.626 m)   Wt 144 lb (65.3 kg)   BMI 24.72 kg/m    Constitutional:  Alert and oriented, No acute distress. Seems to be in mild achy Left flank pain GU: +mild left CVA tenderness Abd: nondistended, nontender  Laboratory Data:  Latest Reference Range & Units 04/17/24 09:05  Creatinine 0.44 - 1.00 mg/dL 8.72 (H)  (H): Data is abnormally high  Pertinent Imaging: I have personally viewed and interpreted the CT A/P scans from July 2025 and June 2025 - agree with read, suggestive of Left UPJO with proximal hydronephrosis, appearance of a lower pole crossing vessel. Otherwise morphologically normal appearing bilateral kidneys/ureters/bladder.   Assessment & Plan:    Ureteropelvic junction (UPJ) obstruction, left Assessment & Plan: Suspected Left UPJO w/ crossing  vessel  +symptomatic  Cr 1.27   I reviewed her clinical history and prior imaging. 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 Lasix  renogram ordered, but not yet completed.   - complete Lasix  renogram scheduled 06/07/24 - RTC for review w/ me on 9/15-9/17- likely pre-op visit for a robotic Left pyeloplasty - Recommend a retrial of Tylenol  / NSAIDs, start at low dose and monitor for SEs - unclear if prior reaction was drug-related. If tolerated well, increase to max dose NSAIDs particularly which can help alleviate renal colic         Dominique JONELLE Skye, MD  [facility] [address] [place], [place] [address] [phone]

## 2024-05-17 DIAGNOSIS — N135 Crossing vessel and stricture of ureter without hydronephrosis: Secondary | ICD-10-CM | POA: Insufficient documentation

## 2024-05-17 NOTE — Assessment & Plan Note (Addendum)
 Suspected Left UPJO w/ crossing vessel  +symptomatic  Cr 1.27   I reviewed her clinical history and prior imaging. 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 Lasix  renogram ordered, but not yet completed.   - complete Lasix  renogram scheduled 06/07/24 - RTC for review w/ me on 9/15-9/17- likely pre-op visit for a robotic Left pyeloplasty - Recommend a retrial of Tylenol  / NSAIDs, start at low dose and monitor for SEs - unclear if prior reaction was drug-related. If tolerated well, increase to max dose NSAIDs particularly which can help alleviate renal colic

## 2024-05-21 ENCOUNTER — Encounter: Payer: Self-pay | Admitting: Oncology

## 2024-05-24 ENCOUNTER — Ambulatory Visit: Admitting: Urology

## 2024-05-24 ENCOUNTER — Encounter: Payer: Self-pay | Admitting: Urology

## 2024-05-24 VITALS — BP 148/108 | HR 99 | Ht 64.0 in | Wt 144.0 lb

## 2024-05-24 DIAGNOSIS — N135 Crossing vessel and stricture of ureter without hydronephrosis: Secondary | ICD-10-CM | POA: Diagnosis not present

## 2024-05-25 ENCOUNTER — Encounter: Payer: Self-pay | Admitting: Oncology

## 2024-06-04 NOTE — Progress Notes (Signed)
   06/14/2024 10:52 AM   Dominique Shea [birth year] [id number]  Reason for visit: Follow up left UPJO   HPI:  06/14/24: follow up today to review renogram   S/p Lasix  renogram (06/07/24) - c/w Left UPJO, minimal drainage post lasix . Split 26% L, 74% R She is also having severe ongoing pain, now on narcotics, seen in ED earlier this week  Recently evaluated by Dr. Francisca, Aug 2025 Several month history of intermittent Left flank pain, ER visits w/ multiple CT scans CT scans in June 2025 showed moderate Left hydronephrosis, no ureteral dilation, appearance of Left crossing vessel, suggestive of UPJO. Intervals scans x2 over the summer characterized similar findings.   +Dietl's crises.   Past surgical history notable for laparoscopic cholecystectomy. Renal function had been normal, but most recent creatinine from 04/17/2024 was mildly elevated at 1.27 with eGFR 58.   Today, pain is fairly constant x2 months. Intermittent spikes with fluids / positional changes. She has been taking tramadol . Has not taken tylenol  /NSAIDS due to ?hx of remote SEs - hives and or eye swelling  Lives alone with 3 small children. Mother works nightshifts and can offer some support, same with a friend who can stay with her if surgery needed  Physical Exam: BP (!) 159/122 (BP Location: Left Arm, Patient Position: Sitting, Cuff Size: Normal)   Pulse 85   Ht 5' 4 (1.626 m)   Wt 145 lb 6.4 oz (66 kg)   LMP 06/10/2024   BMI 24.96 kg/m    Constitutional:  Alert and oriented, No acute distress. Seems to be in mild achy Left flank pain GU: +mild left CVA tenderness Abd: nondistended, nontender  Laboratory Data:  Latest Reference Range & Units 04/17/24 09:05  Creatinine 0.44 - 1.00 mg/dL 8.72 (H)  (H): Data is abnormally high  Pertinent Imaging: I have personally viewed and interpreted the CT A/P scans from July 2025 and June 2025 - agree with read, suggestive of Left UPJO with proximal  hydronephrosis, appearance of a lower pole crossing vessel. Otherwise morphologically normal appearing bilateral kidneys/ureters/bladder.   Assessment & Plan:    Ureteropelvic junction (UPJ) obstruction, left Assessment & Plan: Suspected Left UPJO w/ crossing vessel  +symptomatic, on narcotics Lasix  renogram: c/w Left UPJO, minimal drainage post lasix . Split 26% L, 74% R Cr 1.27   I have reviewed her recent renogram with her today.  As expected, this is consistent with a high-grade left UPJO.  She is quite symptomatic and I would like to expedite her surgical repair.  Today we again reviewed the pyeloplasty procedure, robotic approach-expected perioperative pathway, recovery, expected outcomes and possible complications.  I would expect an overnight hospital stay and an indwelling ureteral stent for ~4-5 weeks.  All questions answered, patient wanted to proceed with surgery soon as possible.   - Scheduled for OR on 06/19/2024-left robotic pyeloplasty, ureteral stent placement, possible cystoscopy -Expect overnight observation, clinic follow-up in 4-5 weeks for cystoscopy stent removal  Orders: -     Ambulatory Referral For Surgery Scheduling        [name], MD  [facility] [address], [address] [place], [place] [address] [phone]

## 2024-06-04 NOTE — H&P (View-Only) (Signed)
   06/14/2024 10:52 AM   Dominique Shea [birth year] [id number]  Reason for visit: Follow up left UPJO   HPI:  06/14/24: follow up today to review renogram   S/p Lasix  renogram (06/07/24) - c/w Left UPJO, minimal drainage post lasix . Split 26% L, 74% R She is also having severe ongoing pain, now on narcotics, seen in ED earlier this week  Recently evaluated by Dr. Francisca, Aug 2025 Several month history of intermittent Left flank pain, ER visits w/ multiple CT scans CT scans in June 2025 showed moderate Left hydronephrosis, no ureteral dilation, appearance of Left crossing vessel, suggestive of UPJO. Intervals scans x2 over the summer characterized similar findings.   +Dietl's crises.   Past surgical history notable for laparoscopic cholecystectomy. Renal function had been normal, but most recent creatinine from 04/17/2024 was mildly elevated at 1.27 with eGFR 58.   Today, pain is fairly constant x2 months. Intermittent spikes with fluids / positional changes. She has been taking tramadol . Has not taken tylenol  /NSAIDS due to ?hx of remote SEs - hives and or eye swelling  Lives alone with 3 small children. Mother works nightshifts and can offer some support, same with a friend who can stay with her if surgery needed  Physical Exam: BP (!) 159/122 (BP Location: Left Arm, Patient Position: Sitting, Cuff Size: Normal)   Pulse 85   Ht 5' 4 (1.626 m)   Wt 145 lb 6.4 oz (66 kg)   LMP 06/10/2024   BMI 24.96 kg/m    Constitutional:  Alert and oriented, No acute distress. Seems to be in mild achy Left flank pain GU: +mild left CVA tenderness Abd: nondistended, nontender  Laboratory Data:  Latest Reference Range & Units 04/17/24 09:05  Creatinine 0.44 - 1.00 mg/dL 8.72 (H)  (H): Data is abnormally high  Pertinent Imaging: I have personally viewed and interpreted the CT A/P scans from July 2025 and June 2025 - agree with read, suggestive of Left UPJO with proximal  hydronephrosis, appearance of a lower pole crossing vessel. Otherwise morphologically normal appearing bilateral kidneys/ureters/bladder.   Assessment & Plan:    Ureteropelvic junction (UPJ) obstruction, left Assessment & Plan: Suspected Left UPJO w/ crossing vessel  +symptomatic, on narcotics Lasix  renogram: c/w Left UPJO, minimal drainage post lasix . Split 26% L, 74% R Cr 1.27   I have reviewed her recent renogram with her today.  As expected, this is consistent with a high-grade left UPJO.  She is quite symptomatic and I would like to expedite her surgical repair.  Today we again reviewed the pyeloplasty procedure, robotic approach-expected perioperative pathway, recovery, expected outcomes and possible complications.  I would expect an overnight hospital stay and an indwelling ureteral stent for ~4-5 weeks.  All questions answered, patient wanted to proceed with surgery soon as possible.   - Scheduled for OR on 06/19/2024-left robotic pyeloplasty, ureteral stent placement, possible cystoscopy -Expect overnight observation, clinic follow-up in 4-5 weeks for cystoscopy stent removal  Orders: -     Ambulatory Referral For Surgery Scheduling        [name], MD  [facility] [address], [address] [place], [place] [address] [phone]

## 2024-06-04 NOTE — Assessment & Plan Note (Addendum)
 Suspected Left UPJO w/ crossing vessel  +symptomatic, on narcotics Lasix  renogram: c/w Left UPJO, minimal drainage post lasix . Split 26% L, 74% R Cr 1.27   I have reviewed her recent renogram with her today.  As expected, this is consistent with a high-grade left UPJO.  She is quite symptomatic and I would like to expedite her surgical repair.  Today we again reviewed the pyeloplasty procedure, robotic approach-expected perioperative pathway, recovery, expected outcomes and possible complications.  I would expect an overnight hospital stay and an indwelling ureteral stent for ~4-5 weeks.  All questions answered, patient wanted to proceed with surgery soon as possible.   - Scheduled for OR on 06/19/2024-left robotic pyeloplasty, ureteral stent placement, possible cystoscopy -Expect overnight observation, clinic follow-up in 4-5 weeks for cystoscopy stent removal

## 2024-06-07 ENCOUNTER — Ambulatory Visit
Admission: RE | Admit: 2024-06-07 | Discharge: 2024-06-07 | Disposition: A | Discharge status: Home / Self Care | Source: Ambulatory Visit | Attending: Urology | Admitting: Urology

## 2024-06-07 DIAGNOSIS — R935 Abnormal findings on diagnostic imaging of other abdominal regions, including retroperitoneum: Secondary | ICD-10-CM | POA: Diagnosis not present

## 2024-06-07 DIAGNOSIS — N135 Crossing vessel and stricture of ureter without hydronephrosis: Secondary | ICD-10-CM | POA: Diagnosis present

## 2024-06-07 DIAGNOSIS — N2889 Other specified disorders of kidney and ureter: Secondary | ICD-10-CM | POA: Diagnosis not present

## 2024-06-07 MED ORDER — TRAMADOL HCL 50 MG PO TABS: 25.0000 mg | ORAL_TABLET | Freq: Four times a day (QID) | ORAL | 0 refills | Status: DC | PRN

## 2024-06-07 MED ORDER — ONDANSETRON 4 MG PO TBDP: 4.0000 mg | ORAL_TABLET | Freq: Three times a day (TID) | ORAL | 0 refills | Status: AC | PRN

## 2024-06-07 MED ORDER — FUROSEMIDE 10 MG/ML IJ SOLN
33.0000 mg | Freq: Once | INTRAMUSCULAR | Status: DC
  Filled 2024-06-07 (×2): qty 3.3

## 2024-06-07 MED ORDER — TECHNETIUM TC 99M MERTIATIDE
5.4000 | Freq: Once | INTRAVENOUS | Status: AC | PRN
  Administered 2024-06-07: 5.4 via INTRAVENOUS

## 2024-06-12 ENCOUNTER — Other Ambulatory Visit: Payer: Self-pay

## 2024-06-12 ENCOUNTER — Emergency Department

## 2024-06-12 ENCOUNTER — Emergency Department
Admission: EM | Admit: 2024-06-12 | Discharge: 2024-06-13 | Disposition: A | Discharge status: Home / Self Care | Attending: Emergency Medicine | Admitting: Emergency Medicine

## 2024-06-12 DIAGNOSIS — R109 Unspecified abdominal pain: Secondary | ICD-10-CM | POA: Diagnosis not present

## 2024-06-12 DIAGNOSIS — N132 Hydronephrosis with renal and ureteral calculous obstruction: Secondary | ICD-10-CM | POA: Diagnosis not present

## 2024-06-12 DIAGNOSIS — N135 Crossing vessel and stricture of ureter without hydronephrosis: Secondary | ICD-10-CM

## 2024-06-12 DIAGNOSIS — N1330 Unspecified hydronephrosis: Secondary | ICD-10-CM | POA: Diagnosis not present

## 2024-06-12 DIAGNOSIS — I10 Essential (primary) hypertension: Secondary | ICD-10-CM

## 2024-06-12 LAB — CBC
HCT: 39.4 % (ref 36.0–46.0)
Hemoglobin: 13.7 g/dL (ref 12.0–15.0)
MCH: 32.2 pg (ref 26.0–34.0)
MCHC: 34.8 g/dL (ref 30.0–36.0)
MCV: 92.7 fL (ref 80.0–100.0)
Platelets: 161 K/uL (ref 150–400)
RBC: 4.25 MIL/uL (ref 3.87–5.11)
RDW: 12.6 % (ref 11.5–15.5)
WBC: 11.8 K/uL — ABNORMAL HIGH (ref 4.0–10.5)
nRBC: 0 % (ref 0.0–0.2)

## 2024-06-12 LAB — URINALYSIS, ROUTINE W REFLEX MICROSCOPIC
Bilirubin Urine: NEGATIVE
Glucose, UA: NEGATIVE mg/dL
Hgb urine dipstick: NEGATIVE
Ketones, ur: NEGATIVE mg/dL
Leukocytes,Ua: NEGATIVE
Nitrite: NEGATIVE
Protein, ur: NEGATIVE mg/dL
Specific Gravity, Urine: 1.025 (ref 1.005–1.030)
pH: 6 (ref 5.0–8.0)

## 2024-06-12 LAB — COMPREHENSIVE METABOLIC PANEL WITH GFR
ALT: 8 U/L (ref 0–44)
AST: 14 U/L — ABNORMAL LOW (ref 15–41)
Albumin: 3.7 g/dL (ref 3.5–5.0)
Alkaline Phosphatase: 38 U/L (ref 38–126)
Anion gap: 9 (ref 5–15)
BUN: 16 mg/dL (ref 6–20)
CO2: 21 mmol/L — ABNORMAL LOW (ref 22–32)
Calcium: 9.1 mg/dL (ref 8.9–10.3)
Chloride: 109 mmol/L (ref 98–111)
Creatinine, Ser: 1.35 mg/dL — ABNORMAL HIGH (ref 0.44–1.00)
GFR, Estimated: 54 mL/min — ABNORMAL LOW (ref >60)
Glucose, Bld: 99 mg/dL (ref 70–99)
Potassium: 3.8 mmol/L (ref 3.5–5.1)
Sodium: 139 mmol/L (ref 135–145)
Total Bilirubin: 0.7 mg/dL (ref 0.0–1.2)
Total Protein: 7 g/dL (ref 6.5–8.1)

## 2024-06-12 LAB — LIPASE, BLOOD: Lipase: 41 U/L (ref 11–51)

## 2024-06-12 LAB — POC URINE PREG, ED: Preg Test, Ur: NEGATIVE

## 2024-06-12 MED ORDER — AMLODIPINE BESYLATE 5 MG PO TABS
10.0000 mg | ORAL_TABLET | Freq: Once | ORAL | Status: AC
  Administered 2024-06-12: 10 mg via ORAL
  Filled 2024-06-12: qty 2

## 2024-06-12 MED ORDER — MORPHINE SULFATE (PF) 4 MG/ML IV SOLN
4.0000 mg | Freq: Once | INTRAVENOUS | Status: AC
  Administered 2024-06-13: 4 mg via INTRAVENOUS
  Filled 2024-06-12: qty 1

## 2024-06-12 MED ORDER — ONDANSETRON HCL 4 MG/2ML IJ SOLN
4.0000 mg | INTRAMUSCULAR | Status: AC
  Administered 2024-06-12: 4 mg via INTRAVENOUS
  Filled 2024-06-12: qty 2

## 2024-06-12 MED ORDER — KETOROLAC TROMETHAMINE 30 MG/ML IJ SOLN
15.0000 mg | Freq: Once | INTRAMUSCULAR | Status: AC
  Administered 2024-06-13: 15 mg via INTRAVENOUS
  Filled 2024-06-12: qty 1

## 2024-06-12 MED ORDER — OXYCODONE HCL 5 MG PO TABS
5.0000 mg | ORAL_TABLET | Freq: Once | ORAL | Status: AC
  Administered 2024-06-12: 5 mg via ORAL
  Filled 2024-06-12: qty 1

## 2024-06-12 NOTE — ED Triage Notes (Signed)
 C/O left low back flank pain (known obstruction, surgery planned for next week) and headache.  AAOx3.  Skin warm and dry. NAD

## 2024-06-12 NOTE — ED Provider Notes (Signed)
 [facility] Provider Note   Event Date/Time   First MD Initiated Contact with Patient 06/12/24 2129     (approximate)  History   Left flank and lower abdominal pain  HPI  Dominique Shea is a 33 y.o. 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  She will get pain in her left lower abdomen that radiates to her left flank.  She has been following closely with urology and recently had a test done that she is post to get results back on Thursday.  She will have episodes of severe pain with nausea and vomiting in the same spot over and over again.  She is supposed to see urology Thursday for an appointment to define a plan for potential procedure or surgical need in the future.  She does not however have the results from her recent test [had a nuclear medicine study August 11 months renal imaging]  No fevers or chills reports nausea.  No abdominal pain that is new but reports it is a little the year and in the same area of previous.  No vaginal bleeding or discharge.  Denies pregnancy.  Took tramadol  at home without relief.  Physical Exam   Triage Vital Signs: ED Triage Vitals  Encounter Vitals Group     BP 06/12/24 1705 (!) 162/129     Girls Systolic BP Percentile --      Girls Diastolic BP Percentile --      Boys Systolic BP Percentile --      Boys Diastolic BP Percentile --      Pulse Rate 06/12/24 1705 70     Resp 06/12/24 1705 16     Temp 06/12/24 1705 97.9 F (36.6 C)     Temp Source 06/12/24 1705 Oral     SpO2 06/12/24 1705 99 %     Weight 06/12/24 1705 143 lb 15.4 oz (65.3 kg)     Height --      Head Circumference --      Peak Flow --      Pain Score 06/12/24 1704 10     Pain Loc --      Pain Education --      Exclude from Growth Chart --     Most recent vital  signs: Vitals:   06/12/24 2330 06/12/24 2343  BP: (!) 144/105   Pulse: (!) 59 (!) 51  Resp:    Temp:    SpO2: 100% 99%     General: Awake, no distress.  Does not appear in acute distress but reports pain appears little uncomfortable and reporting pain in the left flank. CV:  Good peripheral perfusion.  Normal tones and rate Resp:  Normal effort.  Clear bilateral Abd:  No distention.  Soft nontender nondistended throughout with exception of the left lower quadrant and left flank where she reports a pain and a feeling of a fullness and achiness and nauseated feeling originating from the left mid flank. Other:  Warm well-perfused extremities.   Blood pressure is noted to be elevated patient reports that due to insurance she is supposed to be taking amlodipine  but has not been able to get it refilled  Denies headache no chest pain no shortness of breath   ED Results / Procedures / Treatments   Labs (all labs ordered are listed, but  only abnormal results are displayed) Labs Reviewed  COMPREHENSIVE METABOLIC PANEL WITH GFR - Abnormal; Notable for the following components:      Result Value   CO2 21 (*)    Creatinine, Ser 1.35 (*)    AST 14 (*)    GFR, Estimated 54 (*)    All other components within normal limits  CBC - Abnormal; Notable for the following components:   WBC 11.8 (*)    All other components within normal limits  URINALYSIS, ROUTINE W REFLEX MICROSCOPIC - Abnormal; Notable for the following components:   Color, Urine YELLOW (*)    APPearance CLEAR (*)    All other components within normal limits  LIPASE, BLOOD  POC URINE PREG, ED   Labs reveal very slightly elevated white count, fairly nonspecific.  Preserve renal function to baseline creatinine 1.35.  LFTs and lipase and remaining electrolytes normal  Urinalysis is clear without evidence of infection.  Pregnancy test negative.  EKG     RADIOLOGY  US  Renal Result Date: 06/12/2024 CLINICAL DATA:   Left-sided flank pain EXAM: RENAL / URINARY TRACT ULTRASOUND COMPLETE COMPARISON:  04/17/2024 FINDINGS: Right Kidney: Renal measurements: 10.4 x 3.7 x 5.3 cm. = volume: 106 mL. Echogenicity within normal limits. No mass or hydronephrosis visualized. Left Kidney: Renal measurements: 13.1 x 6.9 x 6.2 cm. = volume: 290 mL. Moderate left-sided hydronephrosis is again noted and stable. 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 Mild perinephric fluid is noted. Bladder: Appears normal for degree of bladder distention. Left ureteral jet is not well visualized. Other: None. 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 Electronically Signed   By: Oneil Devonshire M.D.   On: 06/12/2024 23:28         PROCEDURES:  Critical Care performed: No  Procedures   MEDICATIONS ORDERED IN ED: Medications  morphine  (PF) 4 MG/ML injection 4 mg (has no administration in time range)  ketorolac  (TORADOL ) 30 MG/ML injection 15 mg (has no administration in time range)  oxyCODONE  (Oxy IR/ROXICODONE ) immediate release tablet 5 mg (5 mg Oral Given 06/12/24 1713)  amLODipine  (NORVASC ) tablet 10 mg (10 mg Oral Given 06/12/24 2150)  ondansetron  (ZOFRAN ) injection 4 mg (4 mg Intravenous Given 06/12/24 2149)  oxyCODONE  (Oxy IR/ROXICODONE ) immediate release tablet 5 mg (5 mg Oral Given 06/12/24 2150)     IMPRESSION / MDM / ASSESSMENT AND PLAN / ED COURSE  I reviewed the triage vital signs and the nursing notes.                              Differential diagnosis includes, but is not limited to, nephrolithiasis, ureteral obstruction, urinary tract infection pyelonephritis etc.  No acute associated lower pelvic pain patient reports that she has had the same pain multiple times in the past and is being worked up for renal obstruction's as cause.  Reviewed prior history and imaging appears consistent with previous evaluations and is currently following with Dr.' Francisca.    Patient's presentation is most consistent with acute complicated illness / injury requiring diagnostic workup.   No fever no obvious infectious symptoms.  No dysuria.      I discussed with the patient the risks and benefits of abdominal CT scan. The present time there is no clear indication that the patient requires CT especially in light of her previous presentations and multiple CT imaging for similar symptoms over the last few months.  Discussed with the patient, discussed risks and benefits and patient relates history of same symptoms previous currently following with urology.  Would like to forego CT imaging at this time and I think that is clinically reasonable given the pattern of illness and workup already performed to this point via urology and prior ED visits. as the patient's clinician I think this is a very reasonable decision having discussed general risks and benefits of CT, and my clinical suspicion that CT would be of benefit at this time is very low.    Clinical Course as of 06/13/24 0002  Tue Jun 12, 2024  2306 Dr. Twylla paged 2x and [facility] message sent. [MQ]  2321 Received signout: 33 yo lady of intermittent left 3 months of LLQ pain. Possible chronic UPJ obstruction. Had a nuclear medicine study. [ ]  renal ultrasound [ ]  urology.  [HD]  2339 Escalated to house supervisor (unable to reach Dr. Twylla).  [MQ]  2359 Discussed case ultrasound results clinical history recent urology evaluation with Dr. Twylla.  He advises at this time that the treatment plan would be symptomatic management.  If pain uncontrolled potential medical admission with urology consult, but anticipates if pain well-controlled having discussed case imaging labs that outpatient follow-up as planned this week with urology would be appropriate. [MQ]  2359 Discussed with patient she is understanding of plan, she reports she has had Toradol  and morphine  in the past without side effect.  Reviewed records it  appears that this is deep in the case.  Will give morphine  and Toradol  [MQ]  Wed Jun 13, 2024  0000 , plan for reassessment assigned to oncoming ED physician.  Disposition pending pain control and reassessment [MQ]    Clinical Course User Index [HD] Nicholaus Rolland BRAVO, MD [MQ] Dicky Anes, MD     FINAL CLINICAL IMPRESSION(S) / ED DIAGNOSES   Final diagnoses:  UPJ (ureteropelvic junction) obstruction   Left flank pain  Rx / DC Orders   ED Discharge Orders     None        Note:  This document was prepared using Dragon voice recognition software and may include unintentional dictation errors.   Dicky Anes, MD 06/13/24 STANLY

## 2024-06-12 NOTE — ED Notes (Signed)
Pt given pillow and warm blankets at this time.

## 2024-06-13 MED ORDER — OXYCODONE HCL 5 MG PO CAPS: 5.0000 mg | ORAL_CAPSULE | Freq: Four times a day (QID) | ORAL | 0 refills | Status: DC | PRN

## 2024-06-13 MED ORDER — AMLODIPINE BESYLATE 10 MG PO TABS: 10.0000 mg | ORAL_TABLET | Freq: Every day | ORAL | 0 refills | Status: AC

## 2024-06-13 NOTE — Discharge Instructions (Addendum)
 Please set up close follow-up continue to follow with your urologist.  Do not drive this evening.  Please return to the emergency room right away if you are to develop a fever, severe nausea, your pain becomes severe or worsens, you are unable to keep food down, begin vomiting any dark or bloody fluid, you develop any dark or bloody stools, feel dehydrated, or other new concerns or symptoms arise.

## 2024-06-13 NOTE — ED Provider Notes (Signed)
 Clinical Course as of 06/13/24 0150                   Wed Jun 13, 2024  0000 , plan for reassessment assigned to oncoming ED physician.  Disposition pending pain control and reassessment [MQ]  0020 Patient reevaluated, sleeping comfortably.  She states that she feels comfortable going home but request something stronger for her pain as the tramadol  is not working.  She will discontinue this and I will send her with oxycodone  IR.  She will follow-up with her own urologist in the morning [HD]    Clinical Course User Index [HD] Nicholaus Rolland BRAVO, MD [MQ] Dicky Anes, MD   Dispo diagnosis, chronic urinary obstruction, chronic pain.  Sent with prescription for oxycodone  and amlodipine   At time of discharge there is no evidence of acute life, limb, vision, or fertility threat. Patient has stable vital signs, pain is well controlled, patient is ambulatory and p.o. tolerant.  Discharge instructions were completed using the EPIC system. I would refer you to those at this time. All warnings prescriptions follow-up etc. were discussed in detail with the patient. Patient indicates understanding and is agreeable with this plan. All questions answered.  Patient is made aware that they may return to the emergency department for any worsening or new condition or for any other emergency.    Nicholaus Rolland BRAVO, MD 06/13/24 [phone]

## 2024-06-14 ENCOUNTER — Ambulatory Visit: Admitting: Urology

## 2024-06-14 ENCOUNTER — Telehealth: Payer: Self-pay

## 2024-06-14 ENCOUNTER — Encounter: Payer: Self-pay | Admitting: Urology

## 2024-06-14 ENCOUNTER — Other Ambulatory Visit: Payer: Self-pay

## 2024-06-14 VITALS — BP 159/122 | HR 85 | Ht 64.0 in | Wt 145.4 lb

## 2024-06-14 DIAGNOSIS — N135 Crossing vessel and stricture of ureter without hydronephrosis: Secondary | ICD-10-CM

## 2024-06-14 NOTE — Progress Notes (Signed)
 Surgical Physician Order Form [facility]  Dr. [name], MD  * Scheduling expectation : ASAP (Tues 06/19/24) if possible  *Length of Case: 3 hours  *Clearance needed: no  *Anticoagulation Instructions: N/A  *Aspirin Instructions: N/A  *Post-op visit Date/Instructions:  4-5 week post op for cysto/stent removal in clinic  *Diagnosis: LEFT UPJO  *Procedure: left robotic pyeloplasty   Additional orders: N/A  -Admit type: OUTpatient  -Anesthesia: General  -VTE Prophylaxis Standing Order SCD's       Other:   -Standing Lab Orders Per Anesthesia    Lab other: UA&Urine Culture, UDS, BMP  -Standing Test orders EKG/Chest x-ray per Anesthesia       Test other:   - Medications:  Ancef  2gm IV  -Other orders:  N/A

## 2024-06-14 NOTE — Telephone Encounter (Signed)
 Per Dr. Twylla, Patient is to be scheduled for Left Robotic Pyeloplasty   Ms. Beckstrom was contacted and possible surgical dates were discussed, Tuesday September 23rd, 2025 was agreed upon for surgery.  Patient was directed to call [phone] between 1-3pm the day before surgery to find out surgical arrival time.  Instructions were given not to eat or drink from midnight on the night before surgery and have a driver for the day of surgery. On the surgery day patient was instructed to enter through the Medical Mall entrance of [facility] report the Same Day Surgery desk.   Pre-Admit Testing will be in contact via phone to set up an interview with the anesthesia team to review your history and medications prior to surgery.   Reminder of this information was sent via MyChart to the patient.

## 2024-06-14 NOTE — Progress Notes (Signed)
   [facility] Surgical Posting Form  Surgery Date: Date: 06/19/2024  Surgeon: Dr. [name], MD  Inpt ( No  )   Outpt (No)   Obs ( Yes  )   Diagnosis: N13.5 Ureteropelvic Junction Obstruction  -CPT: 49455  Surgery: Left Robotic Pyeloplasty  Stop Anticoagulations: No  Cardiac/Medical/Pulmonary Clearance needed: no  *Orders entered into EPIC  Date: 06/14/24   *Case booked in [place]  Date: 06/14/24  *Notified pt of Surgery: Date: 06/14/24  PRE-OP UA & CX: yes, and will also obtain UDS and BMP  *Placed into Prior Authorization Work [place] Date: 06/14/24  Assistant/laser/rep:No

## 2024-06-15 ENCOUNTER — Encounter
Admission: RE | Admit: 2024-06-15 | Discharge: 2024-06-15 | Disposition: A | Discharge status: Home / Self Care | Source: Ambulatory Visit | Attending: Urology | Admitting: Urology

## 2024-06-15 DIAGNOSIS — N135 Crossing vessel and stricture of ureter without hydronephrosis: Secondary | ICD-10-CM | POA: Insufficient documentation

## 2024-06-15 DIAGNOSIS — Z01818 Encounter for other preprocedural examination: Secondary | ICD-10-CM | POA: Diagnosis present

## 2024-06-15 DIAGNOSIS — I10 Essential (primary) hypertension: Secondary | ICD-10-CM | POA: Insufficient documentation

## 2024-06-15 DIAGNOSIS — Z01810 Encounter for preprocedural cardiovascular examination: Secondary | ICD-10-CM | POA: Diagnosis not present

## 2024-06-15 LAB — BASIC METABOLIC PANEL WITH GFR
Anion gap: 8 (ref 5–15)
BUN: 12 mg/dL (ref 6–20)
CO2: 25 mmol/L (ref 22–32)
Calcium: 9 mg/dL (ref 8.9–10.3)
Chloride: 105 mmol/L (ref 98–111)
Creatinine, Ser: 1.36 mg/dL — ABNORMAL HIGH (ref 0.44–1.00)
GFR, Estimated: 53 mL/min — ABNORMAL LOW (ref >60)
Glucose, Bld: 94 mg/dL (ref 70–99)
Potassium: 3.8 mmol/L (ref 3.5–5.1)
Sodium: 138 mmol/L (ref 135–145)

## 2024-06-15 LAB — URINALYSIS, COMPLETE (UACMP) WITH MICROSCOPIC
Bilirubin Urine: NEGATIVE
Glucose, UA: NEGATIVE mg/dL
Hgb urine dipstick: NEGATIVE
Ketones, ur: NEGATIVE mg/dL
Leukocytes,Ua: NEGATIVE
Nitrite: NEGATIVE
Protein, ur: NEGATIVE mg/dL
Specific Gravity, Urine: 1.013 (ref 1.005–1.030)
pH: 7 (ref 5.0–8.0)

## 2024-06-15 LAB — URINE DRUG SCREEN, QUALITATIVE (ARMC ONLY)
Amphetamines, Ur Screen: NOT DETECTED
Barbiturates, Ur Screen: NOT DETECTED
Benzodiazepine, Ur Scrn: NOT DETECTED
Cannabinoid 50 Ng, Ur ~~LOC~~: POSITIVE — AB
Cocaine Metabolite,Ur ~~LOC~~: NOT DETECTED
MDMA (Ecstasy)Ur Screen: NOT DETECTED
Methadone Scn, Ur: NOT DETECTED
Opiate, Ur Screen: NOT DETECTED
Phencyclidine (PCP) Ur S: NOT DETECTED
Tricyclic, Ur Screen: NOT DETECTED

## 2024-06-16 LAB — URINE CULTURE

## 2024-06-18 ENCOUNTER — Other Ambulatory Visit: Payer: Self-pay

## 2024-06-18 ENCOUNTER — Encounter
Admission: RE | Admit: 2024-06-18 | Discharge: 2024-06-18 | Disposition: A | Discharge status: Home / Self Care | Source: Ambulatory Visit | Attending: Urology | Admitting: Urology

## 2024-06-18 DIAGNOSIS — N135 Crossing vessel and stricture of ureter without hydronephrosis: Secondary | ICD-10-CM

## 2024-06-18 DIAGNOSIS — Z01810 Encounter for preprocedural cardiovascular examination: Secondary | ICD-10-CM

## 2024-06-18 DIAGNOSIS — I10 Essential (primary) hypertension: Secondary | ICD-10-CM

## 2024-06-18 DIAGNOSIS — Z01818 Encounter for other preprocedural examination: Secondary | ICD-10-CM

## 2024-06-18 HISTORY — DX: Nicotine dependence, cigarettes, uncomplicated: F17.210

## 2024-06-18 HISTORY — DX: Cannabis use, unspecified, uncomplicated: F12.90

## 2024-06-18 HISTORY — DX: Calculus of kidney: N20.0

## 2024-06-18 HISTORY — DX: Essential (primary) hypertension: I10

## 2024-06-18 HISTORY — DX: Radiculopathy, cervical region: M54.12

## 2024-06-18 HISTORY — DX: Alcohol abuse, uncomplicated: F10.10

## 2024-06-18 HISTORY — DX: Crossing vessel and stricture of ureter without hydronephrosis: N13.5

## 2024-06-18 NOTE — Patient Instructions (Signed)
 Your procedure is scheduled on:06-19-24 Tuesday Report to the Registration Desk on the 1st floor of the Medical Mall.Then proceed to the 2nd floor Surgery Desk To find out your arrival time, please call [phone] between 1PM - 3PM on:06-18-24 Monday If your arrival time is 6:00 am, do not arrive before that time as the Medical Mall entrance doors do not open until 6:00 am.  REMEMBER: Instructions that are not followed completely may result in serious medical risk, up to and including death; or upon the discretion of your surgeon and anesthesiologist your surgery may need to be rescheduled.  Do not eat food OR drink liquids after midnight the night before surgery.  No gum chewing or hard candies.  One week prior to surgery:Stop NOW (06-18-24) Stop Anti-inflammatories (NSAIDS) such as Advil , Aleve , Ibuprofen , Motrin , Naproxen , Naprosyn  and Aspirin based products such as Excedrin, Goody's Powder, BC Powder. Stop ANY OVER THE COUNTER supplements until after surgery.  You may however, continue to take Tylenol  if needed for pain up until the day of surgery.  Continue taking all of your other prescription medications up until the day of surgery.  ON THE DAY OF SURGERY ONLY TAKE THESE MEDICATIONS WITH SIPS OF WATER : -amLODipine  (NORVASC )  -you may take oxyCODONE  (OXY IR/ROXICODONE ) for pain if needed  No Alcohol for 24 hours before or after surgery.  No Smoking including e-cigarettes for 24 hours before surgery.  No chewable tobacco products for at least 6 hours before surgery.  No nicotine patches on the day of surgery.  Do not use any recreational drugs for at least a week (preferably 2 weeks) before your surgery.  Please be advised that the combination of cocaine and anesthesia may have negative outcomes, up to and including death. If you test positive for cocaine, your surgery will be cancelled.  On the morning of surgery brush your teeth with toothpaste and water , you may rinse your  mouth with mouthwash if you wish. Do not swallow any toothpaste or mouthwash.  Use CHG Soap as directed on instruction sheet.  Do not wear jewelry, make-up, hairpins, clips or nail polish.  For welded (permanent) jewelry: bracelets, anklets, waist bands, etc.  Please have this removed prior to surgery.  If it is not removed, there is a chance that hospital personnel will need to cut it off on the day of surgery.  Do not wear lotions, powders, or perfumes.   Do not shave body hair from the neck down 48 hours before surgery.  Contact lenses, hearing aids and dentures may not be worn into surgery.  Do not bring valuables to the hospital. [facility] is not responsible for any missing/lost belongings or valuables.   Notify your doctor if there is any change in your medical condition (cold, fever, infection).  Wear comfortable clothing (specific to your surgery type) to the hospital.  After surgery, you can help prevent lung complications by doing breathing exercises.  Take deep breaths and cough every 1-2 hours. Your doctor may order a device called an Incentive Spirometer to help you take deep breaths. When coughing or sneezing, hold a pillow firmly against your incision with both hands. This is called "splinting." Doing this helps protect your incision. It also decreases belly discomfort.  If you are being admitted to the hospital overnight, leave your suitcase in the car. After surgery it may be brought to your room.  In case of increased patient census, it may be necessary for you, the patient, to continue your postoperative  care in the Same Day Surgery department.  If you are being discharged the day of surgery, you will not be allowed to drive home. You will need a responsible individual to drive you home and stay with you for 24 hours after surgery.   If you are taking public transportation, you will need to have a responsible individual with you.  Please call the Pre-admissions  Testing Dept. at [phone] if you have any questions about these instructions.  Surgery Visitation Policy:  Patients having surgery or a procedure may have two visitors.  Children under the age of 44 must have an adult with them who is not the patient.  Inpatient Visitation:    Visiting hours are 7 a.m. to 8 p.m. Up to four visitors are allowed at one time in a patient room. The visitors may rotate out with other people during the day.  One visitor age 96 or older may stay with the patient overnight and must be in the room by 8 p.m.                                                                                                             Preparing for Surgery with CHLORHEXIDINE  GLUCONATE (CHG) Soap  Chlorhexidine  Gluconate (CHG) Soap  o An antiseptic cleaner that kills germs and bonds with the skin to continue killing germs even after washing  o Used for showering the night before surgery and morning of surgery  Before surgery, you can play an important role by reducing the number of germs on your skin.  CHG (Chlorhexidine  gluconate) soap is an antiseptic cleanser which kills germs and bonds with the skin to continue killing germs even after washing.  Please do not use if you have an allergy to CHG or antibacterial soaps. If your skin becomes reddened/irritated stop using the CHG.  1. Shower the NIGHT BEFORE SURGERY and the MORNING OF SURGERY with CHG soap.  2. If you choose to wash your hair, wash your hair first as usual with your normal shampoo.  3. After shampooing, rinse your hair and body thoroughly to remove the shampoo.  4. Use CHG as you would any other liquid soap. You can apply CHG directly to the skin and wash gently with a scrungie or a clean washcloth.  5. Apply the CHG soap to your body only from the neck down. Do not use on open wounds or open sores. Avoid contact with your eyes, ears, mouth, and genitals (private parts). Wash face and genitals (private  parts) with your normal soap.  6. Wash thoroughly, paying special attention to the area where your surgery will be performed.  7. Thoroughly rinse your body with warm water .  8. Do not shower/wash with your normal soap after using and rinsing off the CHG soap.  9. Pat yourself dry with a clean towel.  10. Wear clean pajamas to bed the night before surgery.  12. Place clean sheets on your bed the night of your first shower and do not sleep with pets.  13.  Shower again with the CHG soap on the day of surgery prior to arriving at the hospital.  14. Do not apply any deodorants/lotions/powders.  15. Please wear clean clothes to the hospital.  [employer] to address health-related social needs:  [email or web address]

## 2024-06-19 ENCOUNTER — Encounter
Admission: RE | Disposition: A | Discharge status: Home / Self Care | Payer: Self-pay | Source: Home / Self Care | Attending: Urology

## 2024-06-19 ENCOUNTER — Ambulatory Visit: Payer: Self-pay | Admitting: Urgent Care

## 2024-06-19 ENCOUNTER — Other Ambulatory Visit: Payer: Self-pay

## 2024-06-19 ENCOUNTER — Observation Stay
Admission: RE | Admit: 2024-06-19 | Discharge: 2024-06-20 | Disposition: A | Discharge status: Home / Self Care | Attending: Urology | Admitting: Urology

## 2024-06-19 ENCOUNTER — Encounter: Payer: Self-pay | Admitting: Urology

## 2024-06-19 DIAGNOSIS — N130 Hydronephrosis with ureteropelvic junction obstruction: Secondary | ICD-10-CM | POA: Diagnosis present

## 2024-06-19 DIAGNOSIS — N135 Crossing vessel and stricture of ureter without hydronephrosis: Secondary | ICD-10-CM | POA: Diagnosis not present

## 2024-06-19 DIAGNOSIS — I10 Essential (primary) hypertension: Secondary | ICD-10-CM | POA: Diagnosis not present

## 2024-06-19 DIAGNOSIS — F17210 Nicotine dependence, cigarettes, uncomplicated: Secondary | ICD-10-CM | POA: Insufficient documentation

## 2024-06-19 DIAGNOSIS — Z01810 Encounter for preprocedural cardiovascular examination: Secondary | ICD-10-CM

## 2024-06-19 DIAGNOSIS — Z79899 Other long term (current) drug therapy: Secondary | ICD-10-CM | POA: Insufficient documentation

## 2024-06-19 DIAGNOSIS — Z01818 Encounter for other preprocedural examination: Secondary | ICD-10-CM

## 2024-06-19 HISTORY — PX: ROBOT ASSISTED PYELOPLASTY: SHX5143

## 2024-06-19 LAB — CBC
HCT: 41.2 % (ref 36.0–46.0)
Hemoglobin: 13.7 g/dL (ref 12.0–15.0)
MCH: 31.1 pg (ref 26.0–34.0)
MCHC: 33.3 g/dL (ref 30.0–36.0)
MCV: 93.6 fL (ref 80.0–100.0)
Platelets: 160 K/uL (ref 150–400)
RBC: 4.4 MIL/uL (ref 3.87–5.11)
RDW: 12.7 % (ref 11.5–15.5)
WBC: 12.9 K/uL — ABNORMAL HIGH (ref 4.0–10.5)
nRBC: 0 % (ref 0.0–0.2)

## 2024-06-19 LAB — CREATININE, SERUM
Creatinine, Ser: 1.18 mg/dL — ABNORMAL HIGH (ref 0.44–1.00)
GFR, Estimated: >60 mL/min (ref >60)

## 2024-06-19 LAB — POCT PREGNANCY, URINE: Preg Test, Ur: NEGATIVE

## 2024-06-19 SURGERY — PYELOPLASTY, ROBOT-ASSISTED
Anesthesia: General | Site: Abdomen | Laterality: Left

## 2024-06-19 MED ORDER — SEVOFLURANE IN SOLN
RESPIRATORY_TRACT | Status: AC
Start: 2024-06-19 — End: 2024-06-19
  Filled 2024-06-19: qty 250

## 2024-06-19 MED ORDER — ONDANSETRON HCL 4 MG/2ML IJ SOLN: 4.0000 mg | Freq: Once | INTRAMUSCULAR | Status: DC | PRN

## 2024-06-19 MED ORDER — FENTANYL CITRATE (PF) 100 MCG/2ML IJ SOLN
INTRAMUSCULAR | Status: AC
  Filled 2024-06-19: qty 2

## 2024-06-19 MED ORDER — HYDRALAZINE HCL 20 MG/ML IJ SOLN
INTRAMUSCULAR | Status: AC
  Filled 2024-06-19: qty 1

## 2024-06-19 MED ORDER — LACTATED RINGERS IV SOLN: INTRAVENOUS | Status: DC

## 2024-06-19 MED ORDER — SUGAMMADEX SODIUM 200 MG/2ML IV SOLN
INTRAVENOUS | Status: DC | PRN
  Administered 2024-06-19: 200 mg via INTRAVENOUS

## 2024-06-19 MED ORDER — ONDANSETRON HCL 4 MG/2ML IJ SOLN
INTRAMUSCULAR | Status: DC | PRN
  Administered 2024-06-19 (×2): 4 mg via INTRAVENOUS

## 2024-06-19 MED ORDER — CEFAZOLIN SODIUM-DEXTROSE 2-4 GM/100ML-% IV SOLN
2.0000 g | INTRAVENOUS | Status: AC
  Administered 2024-06-19: 2 g via INTRAVENOUS

## 2024-06-19 MED ORDER — MIDAZOLAM HCL 2 MG/2ML IJ SOLN
INTRAMUSCULAR | Status: DC | PRN
  Administered 2024-06-19: 2 mg via INTRAVENOUS

## 2024-06-19 MED ORDER — HYDRALAZINE HCL 20 MG/ML IJ SOLN
10.0000 mg | Freq: Once | INTRAMUSCULAR | Status: AC
  Administered 2024-06-19: 10 mg via INTRAVENOUS

## 2024-06-19 MED ORDER — FENTANYL CITRATE (PF) 100 MCG/2ML IJ SOLN
INTRAMUSCULAR | Status: DC | PRN
  Administered 2024-06-19 (×4): 50 ug via INTRAVENOUS

## 2024-06-19 MED ORDER — ORAL CARE MOUTH RINSE: 15.0000 mL | Freq: Once | OROMUCOSAL | Status: AC

## 2024-06-19 MED ORDER — ROCURONIUM BROMIDE 100 MG/10ML IV SOLN
INTRAVENOUS | Status: DC | PRN
  Administered 2024-06-19: 10 mg via INTRAVENOUS
  Administered 2024-06-19: 20 mg via INTRAVENOUS
  Administered 2024-06-19: 40 mg via INTRAVENOUS
  Administered 2024-06-19: 20 mg via INTRAVENOUS
  Administered 2024-06-19: 10 mg via INTRAVENOUS

## 2024-06-19 MED ORDER — NAPROXEN 500 MG PO TABS
500.0000 mg | ORAL_TABLET | Freq: Two times a day (BID) | ORAL | Status: DC | PRN
  Administered 2024-06-19 – 2024-06-20 (×2): 500 mg via ORAL
  Filled 2024-06-19 (×3): qty 1

## 2024-06-19 MED ORDER — PROPOFOL 10 MG/ML IV BOLUS
INTRAVENOUS | Status: DC | PRN
  Administered 2024-06-19: 200 mg via INTRAVENOUS

## 2024-06-19 MED ORDER — SURGIFLO WITH THROMBIN (HEMOSTATIC MATRIX KIT) OPTIME
TOPICAL | Status: DC | PRN
  Administered 2024-06-19: 1 via TOPICAL

## 2024-06-19 MED ORDER — SODIUM CHLORIDE 0.9 % IR SOLN
Status: DC | PRN
  Administered 2024-06-19: 1000 mL

## 2024-06-19 MED ORDER — DEXMEDETOMIDINE HCL IN NACL 80 MCG/20ML IV SOLN
INTRAVENOUS | Status: DC | PRN
Start: 2024-06-19 — End: 2024-06-19
  Administered 2024-06-19 (×2): 8 ug via INTRAVENOUS

## 2024-06-19 MED ORDER — CHLORHEXIDINE GLUCONATE 0.12 % MT SOLN
OROMUCOSAL | Status: AC
Start: 2024-06-19 — End: 2024-06-19
  Filled 2024-06-19: qty 15

## 2024-06-19 MED ORDER — LIDOCAINE HCL (CARDIAC) PF 100 MG/5ML IV SOSY
PREFILLED_SYRINGE | INTRAVENOUS | Status: DC | PRN
  Administered 2024-06-19: 100 mg via INTRAVENOUS

## 2024-06-19 MED ORDER — PROPOFOL 10 MG/ML IV BOLUS
INTRAVENOUS | Status: AC
  Filled 2024-06-19: qty 20

## 2024-06-19 MED ORDER — BUPIVACAINE LIPOSOME 1.3 % IJ SUSP
INTRAMUSCULAR | Status: AC
Start: 2024-06-19 — End: 2024-06-19
  Filled 2024-06-19: qty 20

## 2024-06-19 MED ORDER — ONDANSETRON 4 MG PO TBDP: 4.0000 mg | ORAL_TABLET | Freq: Four times a day (QID) | ORAL | Status: DC | PRN

## 2024-06-19 MED ORDER — FENTANYL CITRATE (PF) 100 MCG/2ML IJ SOLN
25.0000 ug | INTRAMUSCULAR | Status: DC | PRN
  Administered 2024-06-19 (×2): 50 ug via INTRAVENOUS

## 2024-06-19 MED ORDER — OXYBUTYNIN CHLORIDE ER 5 MG PO TB24
5.0000 mg | ORAL_TABLET | Freq: Every day | ORAL | Status: DC
  Administered 2024-06-19: 5 mg via ORAL
  Filled 2024-06-19: qty 1

## 2024-06-19 MED ORDER — SUCCINYLCHOLINE CHLORIDE 200 MG/10ML IV SOSY
PREFILLED_SYRINGE | INTRAVENOUS | Status: DC | PRN
  Administered 2024-06-19: 100 mg via INTRAVENOUS

## 2024-06-19 MED ORDER — BUPIVACAINE-EPINEPHRINE (PF) 0.25% -1:200000 IJ SOLN
INTRAMUSCULAR | Status: AC
  Filled 2024-06-19: qty 30

## 2024-06-19 MED ORDER — HEMOSTATIC AGENTS (NO CHARGE) OPTIME
TOPICAL | Status: DC | PRN
  Administered 2024-06-19: 1 via TOPICAL

## 2024-06-19 MED ORDER — OXYCODONE HCL 5 MG PO TABS
ORAL_TABLET | ORAL | Status: AC
  Filled 2024-06-19: qty 1

## 2024-06-19 MED ORDER — BUPIVACAINE LIPOSOME 1.3 % IJ SUSP
INTRAMUSCULAR | Status: DC | PRN
  Administered 2024-06-19: 40 mL via INTRAMUSCULAR

## 2024-06-19 MED ORDER — MIDAZOLAM HCL 2 MG/2ML IJ SOLN
INTRAMUSCULAR | Status: AC
  Filled 2024-06-19: qty 2

## 2024-06-19 MED ORDER — ESMOLOL HCL 100 MG/10ML IV SOLN
INTRAVENOUS | Status: DC | PRN
  Administered 2024-06-19: 30 mg via INTRAVENOUS

## 2024-06-19 MED ORDER — LABETALOL HCL 5 MG/ML IV SOLN
INTRAVENOUS | Status: DC | PRN
  Administered 2024-06-19 (×2): 5 mg via INTRAVENOUS

## 2024-06-19 MED ORDER — TAMSULOSIN HCL 0.4 MG PO CAPS
0.4000 mg | ORAL_CAPSULE | Freq: Every day | ORAL | Status: DC
  Administered 2024-06-19: 0.4 mg via ORAL
  Filled 2024-06-19: qty 1

## 2024-06-19 MED ORDER — CHLORHEXIDINE GLUCONATE 0.12 % MT SOLN
15.0000 mL | Freq: Once | OROMUCOSAL | Status: AC
  Administered 2024-06-19: 15 mL via OROMUCOSAL

## 2024-06-19 MED ORDER — ENOXAPARIN SODIUM 40 MG/0.4ML IJ SOSY
40.0000 mg | PREFILLED_SYRINGE | INTRAMUSCULAR | Status: DC
  Administered 2024-06-20: 40 mg via SUBCUTANEOUS
  Filled 2024-06-19: qty 0.4

## 2024-06-19 MED ORDER — BUPIVACAINE HCL (PF) 0.25 % IJ SOLN
INTRAMUSCULAR | Status: AC
  Filled 2024-06-19: qty 30

## 2024-06-19 MED ORDER — PROPOFOL 1000 MG/100ML IV EMUL
INTRAVENOUS | Status: AC
  Filled 2024-06-19: qty 100

## 2024-06-19 MED ORDER — OXYCODONE HCL 5 MG PO TABS
5.0000 mg | ORAL_TABLET | Freq: Once | ORAL | Status: AC | PRN
  Administered 2024-06-19: 5 mg via ORAL

## 2024-06-19 MED ORDER — WATER FOR IRRIGATION, STERILE IR SOLN
Status: DC | PRN
  Administered 2024-06-19: 500 mL

## 2024-06-19 MED ORDER — ONDANSETRON HCL 4 MG/2ML IJ SOLN
4.0000 mg | Freq: Four times a day (QID) | INTRAMUSCULAR | Status: DC | PRN
  Administered 2024-06-19: 4 mg via INTRAVENOUS
  Filled 2024-06-19: qty 2

## 2024-06-19 MED ORDER — CHLORHEXIDINE GLUCONATE 4 % EX SOLN: 60.0000 mL | Freq: Once | CUTANEOUS | Status: DC

## 2024-06-19 MED ORDER — OXYCODONE HCL 5 MG PO TABS
5.0000 mg | ORAL_TABLET | ORAL | Status: DC | PRN
Start: 2024-06-19 — End: 2024-06-20
  Administered 2024-06-19 – 2024-06-20 (×4): 5 mg via ORAL
  Filled 2024-06-19 (×4): qty 1

## 2024-06-19 MED ORDER — TRAMADOL HCL 50 MG PO TABS
50.0000 mg | ORAL_TABLET | Freq: Four times a day (QID) | ORAL | Status: DC | PRN
  Administered 2024-06-19: 50 mg via ORAL
  Filled 2024-06-19: qty 1

## 2024-06-19 MED ORDER — OXYCODONE HCL 5 MG/5ML PO SOLN: 5.0000 mg | Freq: Once | ORAL | Status: AC | PRN

## 2024-06-19 MED ORDER — DEXAMETHASONE SODIUM PHOSPHATE 10 MG/ML IJ SOLN
INTRAMUSCULAR | Status: DC | PRN
  Administered 2024-06-19: 10 mg via INTRAVENOUS

## 2024-06-19 MED ORDER — PHENYLEPHRINE HCL-NACL 20-0.9 MG/250ML-% IV SOLN
INTRAVENOUS | Status: AC
  Filled 2024-06-19: qty 250

## 2024-06-19 MED ORDER — GLYCOPYRROLATE 0.2 MG/ML IJ SOLN
INTRAMUSCULAR | Status: DC | PRN
  Administered 2024-06-19: .2 mg via INTRAVENOUS

## 2024-06-19 MED ORDER — CEFAZOLIN SODIUM-DEXTROSE 2-4 GM/100ML-% IV SOLN
INTRAVENOUS | Status: AC
  Filled 2024-06-19: qty 100

## 2024-06-19 SURGICAL SUPPLY — 55 items
APPLICATOR ARISTA FLEXITIP XL (MISCELLANEOUS) ×1 IMPLANT
APPLICATOR SURGIFLO ENDO (HEMOSTASIS) IMPLANT
CATH URETL OPEN 5X70 (CATHETERS) ×1 IMPLANT
CHLORAPREP W/TINT 26 (MISCELLANEOUS) ×1 IMPLANT
CLIP LIGATING HEM O LOK PURPLE (MISCELLANEOUS) ×1 IMPLANT
CLIP LIGATING HEMO O LOK GREEN (MISCELLANEOUS) IMPLANT
COVER TIP SHEARS 8 DVNC (MISCELLANEOUS) ×1 IMPLANT
DEFOGGER SCOPE WARM SEASHARP (MISCELLANEOUS) ×1 IMPLANT
DERMABOND ADVANCED .7 DNX12 (GAUZE/BANDAGES/DRESSINGS) ×1 IMPLANT
DRAIN CHANNEL JP 19F RND 3/16 (MISCELLANEOUS) IMPLANT
DRAPE ARM DVNC X/XI (DISPOSABLE) ×4 IMPLANT
DRAPE COLUMN DVNC XI (DISPOSABLE) ×1 IMPLANT
DRAPE INCISE IOBAN 66X45 STRL (DRAPES) ×1 IMPLANT
DRAPE LAPAROSCOPIC ABDOMINAL (DRAPES) ×1 IMPLANT
DRAPE SHEET LG 3/4 BI-LAMINATE (DRAPES) ×1 IMPLANT
DRAPE UTILITY 15X26 TOWEL STRL (DRAPES) ×2 IMPLANT
DRIVER NDL LRG 8 DVNC XI (INSTRUMENTS) ×2 IMPLANT
DRIVER NDLE LRG 8 DVNC XI (INSTRUMENTS) ×2 IMPLANT
ELECTRODE REM PT RTRN 9FT ADLT (ELECTROSURGICAL) ×1 IMPLANT
EVACUATOR SILICONE 100CC (DRAIN) IMPLANT
FORCEPS BPLR FENES DVNC XI (FORCEP) ×1 IMPLANT
FORCEPS PROGRASP DVNC XI (FORCEP) ×1 IMPLANT
GLOVE BIOGEL PI IND STRL 7.0 (GLOVE) ×2 IMPLANT
GOWN STRL REUS W/ TWL LRG LVL3 (GOWN DISPOSABLE) ×2 IMPLANT
GOWN STRL REUS W/TWL XL LVL4 (GOWN DISPOSABLE) ×2 IMPLANT
GRASPER SUT TROCAR 14GX15 (MISCELLANEOUS) ×1 IMPLANT
GUIDEWIRE STR DUAL SENSOR (WIRE) ×1 IMPLANT
HEMOSTAT ARISTA ABSORB 1G (HEMOSTASIS) ×1 IMPLANT
IRRIGATION STRYKERFLOW (MISCELLANEOUS) ×1 IMPLANT
KIT PINK PAD W/HEAD ARM REST (MISCELLANEOUS) ×1 IMPLANT
MANIFOLD NEPTUNE II (INSTRUMENTS) ×1 IMPLANT
NDL HYPO 25GX1X1/2 BEV (NEEDLE) ×1 IMPLANT
NDL INSUFFLATION 14GA 120MM (NEEDLE) ×1 IMPLANT
NEEDLE HYPO 25GX1X1/2 BEV (NEEDLE) ×1 IMPLANT
NEEDLE INSUFFLATION 14GA 120MM (NEEDLE) ×1 IMPLANT
NS IRRIG 1000ML POUR BTL (IV SOLUTION) ×1 IMPLANT
OBTURATOR OPTICALSTD 8 DVNC (TROCAR) ×1 IMPLANT
SCISSORS MNPLR CVD DVNC XI (INSTRUMENTS) ×1 IMPLANT
SEAL UNIV 5-12 XI (MISCELLANEOUS) ×4 IMPLANT
SET TUBE SMOKE EVAC HIGH FLOW (TUBING) ×1 IMPLANT
SOLUTION ELECTROSURG ANTI STCK (MISCELLANEOUS) ×1 IMPLANT
SPONGE DRAIN TRACH 4X4 STRL 2S (GAUZE/BANDAGES/DRESSINGS) IMPLANT
STENT URET 6FRX24 CONTOUR (STENTS) ×1 IMPLANT
SUT ETHILON 3 0 PS 1 (SUTURE) ×1 IMPLANT
SUT ETHILON 3-0 (SUTURE) IMPLANT
SUT MNCRL AB 4-0 PS2 18 (SUTURE) ×2 IMPLANT
SUT VIC AB 4-0 RB1 27X BRD (SUTURE) ×3 IMPLANT
SUT VICRYL 0 UR6 27IN ABS (SUTURE) ×1 IMPLANT
SUTURE EHLN 3-0 FS-10 30 BLK (SUTURE) IMPLANT
SUTURE MNCRL 4-0 27XMF (SUTURE) ×1 IMPLANT
TRAP FLUID SMOKE EVACUATOR (MISCELLANEOUS) ×1 IMPLANT
TRAY FOLEY SLVR 16FR LF STAT (SET/KITS/TRAYS/PACK) ×1 IMPLANT
TROCAR Z THRD FIOS 12X150 (TROCAR) ×1 IMPLANT
TROCAR Z-THREAD FIOS 12X100MM (TROCAR) ×1 IMPLANT
WATER STERILE IRR 500ML POUR (IV SOLUTION) ×1 IMPLANT

## 2024-06-19 NOTE — Anesthesia Procedure Notes (Signed)
 Procedure Name: Intubation Date/Time: 06/19/2024 7:35 AM  Performed by: Ledora Duncan, CRNAPre-anesthesia Checklist: Patient identified, Emergency Drugs available, Suction available and Patient being monitored Patient Re-evaluated:Patient Re-evaluated prior to induction Oxygen Delivery Method: Circle system utilized Preoxygenation: Pre-oxygenation with 100% oxygen Induction Type: IV induction Ventilation: Mask ventilation without difficulty Laryngoscope Size: McGrath and 3 Grade View: Grade I Tube type: Oral Tube size: 6.5 mm Number of attempts: 1 Airway Equipment and Method: Stylet Placement Confirmation: ETT inserted through vocal cords under direct vision, positive ETCO2 and breath sounds checked- equal and bilateral Secured at: 21 cm Tube secured with: Tape Dental Injury: Teeth and Oropharynx as per pre-operative assessment

## 2024-06-19 NOTE — Transfer of Care (Signed)
 Immediate Anesthesia Transfer of Care Note  Patient: Dominique Shea  Procedure(s) Performed: PYELOPLASTY, ROBOT-ASSISTED (Left: Abdomen)  Patient Location: PACU  Anesthesia Type:General  Level of Consciousness: drowsy and patient cooperative  Airway & Oxygen Therapy: Patient Spontanous Breathing and Patient connected to face mask oxygen  Post-op Assessment: Report given to RN and Post -op Vital signs reviewed and stable  Post vital signs: Reviewed and stable, MDA will monitor TFH CRNA  Last Vitals:  Vitals Value Taken Time  BP 172/120 06/19/24 11:09  Temp    Pulse 72 06/19/24 11:11  Resp 18 06/19/24 11:11  SpO2 100 % 06/19/24 11:11  Vitals shown include unfiled device data.  Last Pain:  Vitals:   06/19/24 0611  TempSrc: Tympanic  PainSc: 8          Complications: No notable events documented.

## 2024-06-19 NOTE — Op Note (Signed)
 Date of procedure: 06/19/24  Preoperative diagnosis:  Left ureteropelvic junction obstruction   Postoperative diagnosis:  Left ureteropelvic junction obstruction    Procedure: Left robotic dismembered pyeloplasty Left ureteral stent placement Cystoscopy  Surgeon: [name], MD  Anesthesia: General  Complications: None  Intraoperative findings:  Moderate Left colonic adhesions requiring lysis  Severe left UPJ kinking / J-hook -- no obvious crossing vessel, however significantly sized collateral veins surrounding J-hook in this area Excision of compressed ureteral segment, successful dismembered pyeloplasty with funnel shaped reconstruction  68F x 24 cm JJ ureteral stent in place, no string  EBL: 50  Specimens: 1. Left UPJ  Drains:  1. Indwelling Left ureteral stent  2. LLQ JP Drain 3. Foley catheter  Indication: Dominique Shea is a 33 y.o. patient with symptomatic Left UPJO, confirmed with Lasix  renogram.  After reviewing the management options for treatment, they elected to proceed with the above surgical procedure(s). We have discussed the potential benefits and risks of the procedure, side effects of the proposed treatment, the likelihood of the patient achieving the goals of the procedure, and any potential problems that might occur during the procedure or recuperation. Informed consent has been obtained.  Description of procedure:  The patient was taken to the operating room and general anesthesia was induced. SCDs were placed for DVT prophylaxis. The patient was placed in the modified left flank lithotomy position , prepped and draped in the usual sterile fashion, and preoperative antibiotics(Ancef ) were administered. A preoperative time-out was performed.   We began by placing a 16 [place] two-way catheter on the field.  Next a Veress needle was inserted at Palmer's point and pneumoperitoneum was achieved at 15 mmHg.  The needle was removed and a 8 mm robotic  trocar was inserted, followed by the robotic camera.  The peritoneal cavity was inspected, found to be free from injury.  The remainder of the robotic ports were placed in a soft J pattern towards the left medial ASIS.  A 12 mm assistant port was placed at a supraumbilical location.  The robot was docked and instruments were inserted.  We began reflecting the white line of Toldt, and the descending colon gaining access to the retroperitoneum.  Of note there was moderate left colonic adhesions, which required 30 minutes of lysis to access the retroperitoneum . The ureter was identified and dissected cephalad.  At the UPJ we encountered significant kinking and J hooking of the proximal ureter.  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  There did not appear to be a crossing artery.  The lower renal pelvis was fully mobilized as well as the proximal and mid ureter.  Attention was paid to preserve periureteral blood supply and surrounding collateral veins.  The kinked proximal ureteral segment was excised (approximately 1 cm), proximal and distal internal lumens were directly inspected and found to be widely patent.  The lateral ureter was spatulated as well as the medial renal pelvis. next we performed a pyeloplasty with running 4-0 Vicryl stitches on RB1 needle.  The medial posterior anastomosis was performed first.  Halfway through the past a sensor wire via the assistant port antegrade into the bladder.  This was followed by a 68F x 24 cm JJ ureteral stent without string.  Proximal stent curl was gently placed into the renal pelvis.  At this point rigid cystoscopy was performed which confirmed an appropriate distal bladder stent curl.  Next we completed  our reconstruction by running the anterior anastomosis with additional 4-0 Vicryl stitches.  The anastomosis came together very nicely and appeared watertight.  Hemostasis was  ensured and the operative field irrigated.  Surgiflo and Arista powder were placed.  A JP drain was brought through the LLQ port site and affixed to the skin with nonabsorbable suture.  At this point all instruments were removed.  The 12 mm assistant port site fascia was closed with an 0 Vicryl on UR 6 needle.  The remainder of 8 mm ports were closed with 4-0 Monocryl and Dermabond for skin.  This concluded the procedure and the patient was taken to the PACU in excellent condition  Disposition: Stable to PACU  Plan: - Overnight observation, admit to Medsurg - advance diet as tolerated - Discontinue Foley catheter and JP drain tomorrow AM - Expect DC home by lunchtime   - F/u in clinic in ~4-5 weeks for cystoscopy and stent removal   [name], MD

## 2024-06-19 NOTE — Anesthesia Preprocedure Evaluation (Addendum)
 Anesthesia Evaluation  Patient identified by MRN, date of birth, ID band Patient awake    Reviewed: Allergy & Precautions, NPO status , Patient's Chart, lab work & pertinent test results  History of Anesthesia Complications Negative for: history of anesthetic complications  Airway Mallampati: I   Neck ROM: Full    Dental  (+) Chipped   Pulmonary Current Smoker (1/2 ppd) and Patient abstained from smoking.   Pulmonary exam normal breath sounds clear to auscultation       Cardiovascular hypertension, Normal cardiovascular exam Rhythm:Regular Rate:Normal  ECG 06/15/24:  Normal sinus rhythm Nonspecific T wave abnormality   Neuro/Psych  Headaches PSYCHIATRIC DISORDERS Anxiety     Daily marijuana use    GI/Hepatic negative GI ROS,,,  Endo/Other  negative endocrine ROS    Renal/GU Renal disease (nephrolithiasis)     Musculoskeletal   Abdominal   Peds  Hematology  (+) Blood dyscrasia, anemia   Anesthesia Other Findings   Reproductive/Obstetrics                              Anesthesia Physical Anesthesia Plan  ASA: 2  Anesthesia Plan: General   Post-op Pain Management:    Induction: Intravenous  PONV Risk Score and Plan: 2 and Ondansetron , Dexamethasone  and Treatment may vary due to age or medical condition  Airway Management Planned: Oral ETT  Additional Equipment:   Intra-op Plan:   Post-operative Plan: Extubation in OR  Informed Consent: I have reviewed the patients History and Physical, chart, labs and discussed the procedure including the risks, benefits and alternatives for the proposed anesthesia with the patient or authorized representative who has indicated his/her understanding and acceptance.     Dental advisory given  Plan Discussed with: CRNA  Anesthesia Plan Comments: (Patient consented for risks of anesthesia including but not limited to:  - adverse reactions  to medications - damage to eyes, teeth, lips or other oral mucosa - nerve damage due to positioning  - sore throat or hoarseness - damage to heart, brain, nerves, lungs, other parts of body or loss of life  Informed patient about role of CRNA in peri- and intra-operative care.  Patient voiced understanding.)         Anesthesia Quick Evaluation

## 2024-06-19 NOTE — Progress Notes (Signed)
   Post Op check: Patient in MedSurg floor bed, resting comfortably this afternoon Some mild left abdominal pain, soreness Has not tried clears, no N/V  Physical Exam: BP (!) 133/96 (BP Location: Left Arm)   Pulse 71   Temp 97.6 F (36.4 C) (Oral)   Resp 18   Ht 5' 4 (1.626 m)   Wt 65.8 kg   LMP 06/10/2024   SpO2 100%   BMI 24.89 kg/m    Constitutional:  Alert and oriented, No acute distress. Respiratory: Normal respiratory effort, no increased work of breathing. GI: Abdomen is soft, appropriately tender to palpation.  Port sites intact with Dermabond GU: Foley catheter in place with light yellow urine in tubing Drains: LLQ JP drain with scant seroussang discharge  Laboratory Data: NA  Pertinent Imaging: N/A  Assessment & Plan:   POD0 s/p Left robotic Pyeloplasty Recovering well, no issues  - Clears, advance diet as tolerated - Maintain Foley catheter, JP drain -anticipate removal tomorrow a.m. - No activity restrictions, encourage ambulation this evening, up to chair - Expect discharge tomorrow lunchtime - Follow-up in 4-5 weeks for cystoscopic stent removal  [name], MD

## 2024-06-19 NOTE — Anesthesia Postprocedure Evaluation (Signed)
 Anesthesia Post Note  Patient: [name]  Procedure(s) Performed: PYELOPLASTY, ROBOT-ASSISTED (Left: Abdomen)  Patient location during evaluation: PACU Anesthesia Type: General Level of consciousness: awake and alert Pain management: pain level controlled Vital Signs Assessment: post-procedure vital signs reviewed and stable Respiratory status: spontaneous breathing, nonlabored ventilation, respiratory function stable and patient connected to nasal cannula oxygen Cardiovascular status: blood pressure returned to baseline and stable Postop Assessment: no apparent nausea or vomiting Anesthetic complications: no   No notable events documented.   Last Vitals:  Vitals:   06/19/24 1145 06/19/24 1209  BP: 125/86   Pulse: 78 80  Resp: 16 17  Temp:    SpO2: 100% 100%    Last Pain:  Vitals:   06/19/24 1211  TempSrc:   PainSc: 8                  Debby Mines

## 2024-06-19 NOTE — Interval H&P Note (Signed)
 History and Physical Interval Note:  06/19/2024 6:54 AM  Dominique Shea  has presented today for surgery, with the diagnosis of Left Ureteropelvic Junction Obstruction.  The various methods of treatment have been discussed with the patient and family. After consideration of risks, benefits and other options for treatment, the patient has consented to  Procedure(s): PYELOPLASTY, ROBOT-ASSISTED (Left) as a surgical intervention.  The patient's history has been reviewed, patient examined, no change in status, stable for surgery.  I have reviewed the patient's chart and labs.  Questions were answered to the patient's satisfaction.     [name]

## 2024-06-20 ENCOUNTER — Encounter: Payer: Self-pay | Admitting: Urology

## 2024-06-20 DIAGNOSIS — N130 Hydronephrosis with ureteropelvic junction obstruction: Secondary | ICD-10-CM | POA: Diagnosis not present

## 2024-06-20 LAB — BASIC METABOLIC PANEL WITH GFR
Anion gap: 7 (ref 5–15)
BUN: 12 mg/dL (ref 6–20)
CO2: 24 mmol/L (ref 22–32)
Calcium: 9.1 mg/dL (ref 8.9–10.3)
Chloride: 104 mmol/L (ref 98–111)
Creatinine, Ser: 1.15 mg/dL — ABNORMAL HIGH (ref 0.44–1.00)
GFR, Estimated: >60 mL/min (ref >60)
Glucose, Bld: 98 mg/dL (ref 70–99)
Potassium: 3.9 mmol/L (ref 3.5–5.1)
Sodium: 135 mmol/L (ref 135–145)

## 2024-06-20 LAB — CBC
HCT: 37.6 % (ref 36.0–46.0)
Hemoglobin: 12.9 g/dL (ref 12.0–15.0)
MCH: 31.8 pg (ref 26.0–34.0)
MCHC: 34.3 g/dL (ref 30.0–36.0)
MCV: 92.6 fL (ref 80.0–100.0)
Platelets: 141 K/uL — ABNORMAL LOW (ref 150–400)
RBC: 4.06 MIL/uL (ref 3.87–5.11)
RDW: 12.6 % (ref 11.5–15.5)
WBC: 11.1 K/uL — ABNORMAL HIGH (ref 4.0–10.5)
nRBC: 0 % (ref 0.0–0.2)

## 2024-06-20 LAB — SURGICAL PATHOLOGY

## 2024-06-20 MED ORDER — OXYBUTYNIN CHLORIDE ER 5 MG PO TB24: 5.0000 mg | ORAL_TABLET | Freq: Every day | ORAL | 1 refills | Status: AC

## 2024-06-20 MED ORDER — AMLODIPINE BESYLATE 10 MG PO TABS: 10.0000 mg | ORAL_TABLET | Freq: Every evening | ORAL | Status: DC

## 2024-06-20 MED ORDER — TAMSULOSIN HCL 0.4 MG PO CAPS: 0.4000 mg | ORAL_CAPSULE | Freq: Every day | ORAL | 1 refills | Status: AC

## 2024-06-20 MED ORDER — OXYCODONE HCL 5 MG PO TABS: 5.0000 mg | ORAL_TABLET | Freq: Four times a day (QID) | ORAL | 0 refills | Status: AC | PRN

## 2024-06-20 NOTE — TOC CM/SW Note (Signed)
 Transition of Care [facility]) - Inpatient Brief Assessment   Patient Details  Name: Vendela Troung MRN: [id number] Date of Birth: [birth year]  Transition of Care [facility]) CM/SW Contact:    Alfonso Rummer, LCSW Phone Number: 06/20/2024, 9:34 AM   Clinical Narrative:  KEN DELENA Rummer completed TOC chart review. NO TOC needs identified. Should need arise please contact TOC   Transition of Care Asessment:

## 2024-06-20 NOTE — Discharge Summary (Signed)
 Date of admission: 06/19/2024  Date of discharge: 06/20/2024  Admission diagnosis: Left ureteropelvic junction obstruction  Discharge diagnosis: Left ureteropelvic junction obstruction  Secondary diagnoses:  Patient Active Problem List   Diagnosis Date Noted   Obstruction of left ureteropelvic junction (UPJ) 06/19/2024   Ureteropelvic junction (UPJ) obstruction, left 05/17/2024   Acute pyelonephritis 03/23/2024   Primary hypertension 05/24/2023   Chronic ITP (idiopathic thrombocytopenia) (HCC) 05/24/2023   Cervical radiculopathy 02/09/2021   Iron deficiency anemia 03/23/2018   Elevated blood pressure affecting pregnancy in second trimester, antepartum 01/14/2018   Hemolysis, elevated liver enzymes, and low platelet (HELLP) syndrome during pregnancy, delivered, with postpartum complication 01/14/2018   Unwanted fertility 01/14/2018   Adjustment disorder with anxiety 09/08/2017   Gonorrhea affecting pregnancy in first trimester 08/24/2017   Thrombocytopenia 07/12/2011    History and Physical: For full details, please see admission history and physical. Briefly, Dominique Shea is a 33 y.o. year old patient with a history of intermittent left-sided flank pain and was found to have a left ureteropelvic junction obstruction who underwent left robotic pyeloplasty and left ureteral stent placement for definitive treatment yesterday with Dr. Georganne.    Hospital Course: Patient tolerated the procedure well.  She was then transferred to the floor after an uneventful PACU stay.  Her hospital course was uncomplicated.  On POD#1 she had met discharge criteria: was eating a regular diet, was up and ambulating independently,  pain was well controlled, was voiding without a catheter, and was ready to for discharge.  JP drain was removed after output decreased appropriately.  Ureteral stent remains in place and she is scheduled for removal in the clinic.  Renal function remained stable throughout  hospitalization.  The patient is also afebrile with no signs of infection.   Physical exam: Blood pressure (!) 136/97, pulse 67, temperature 97.6 F (36.4 C), resp. rate 14, height 5' 4 (1.626 m), weight 65.8 kg, last menstrual period 06/10/2024, SpO2 100%.  Constitutional:  Well nourished. Alert and oriented, No acute distress. HEENT:  AT, moist mucus membranes.  Trachea midline, no masses. Cardiovascular: No clubbing, cyanosis, or edema. Respiratory: Normal respiratory effort, no increased work of breathing. Abdomen: Mildly tender, but appropriate for postoperative state.  Incisions are clean and dry.  JP drain site dressing is dry and clean. GU: No CVA tenderness.  No bladder fullness or masses.   Neurologic: Grossly intact, no focal deficits, moving all 4 extremities. Psychiatric: Normal mood and affect.     Laboratory values:  Recent Labs    06/19/24 1449 06/20/24 0554  WBC 12.9* 11.1*  HGB 13.7 12.9  HCT 41.2 37.6   Recent Labs    06/19/24 1449 06/20/24 0554  NA  --  135  K  --  3.9  CL  --  104  CO2  --  24  GLUCOSE  --  98  BUN  --  12  CREATININE 1.18* 1.15*  CALCIUM   --  9.1   No results for input(s): LABPT, INR in the last 72 hours. No results for input(s): LABURIN in the last 72 hours. Results for orders placed or performed during the hospital encounter of 06/15/24  Urine Culture     Status: Abnormal   Collection Time: 06/15/24  8:53 AM   Specimen: Urine, Clean Catch  Result Value Ref Range Status   Specimen Description   Final    URINE, CLEAN CATCH Performed at [facility], [address] [place], [place] [address]  Special Requests   Final    NONE Performed at [facility], [address] [place], [place] [address]    Culture MULTIPLE SPECIES PRESENT, SUGGEST RECOLLECTION (A)  Final   Report Status 06/16/2024 FINAL  Final    Disposition: Home  Discharge instruction: The patient was instructed to be ambulatory  but told to refrain from heavy lifting, strenuous activity, or driving.  She is given instruction on wound care, encouraged to maintain adequate hydration, she is to monitor for fever, flank pain, dysuria, hematuria or signs of infection;all or return to ED if present  Discharge medications:  Allergies as of 06/20/2024       Reactions   Doxycycline  Swelling   Pt had facial swelling after receiving ms04, zofran , hydrocodone , tylenol , ivp dye, and doxycline. Unclear which was responsible agent.   Ibuprofen  Swelling   Facial swelling. Tolerates naproxen .   Iodinated Contrast Media Swelling   Pt had facial swelling after receiving ms04, zofran , hydrocodone , tylenol , ivp dye, and doxycline. Unclear which was responsible agent. Pt had facial swelling after receiving ms04, zofran , hydrocodone , tylenol , ivp dye, and doxycline. Unclear which was responsible agent.    Peanut Butter Flavoring Agent (non-screening) Anaphylaxis   Peanut-containing Drug Products Anaphylaxis   Tomato Anaphylaxis   Tylenol  [acetaminophen ] Hives        Medication List     STOP taking these medications    ondansetron  4 MG tablet Commonly known as: Zofran        TAKE these medications    amLODipine  10 MG tablet Commonly known as: NORVASC  Take 1 tablet (10 mg total) by mouth daily. What changed: when to take this   ondansetron  4 MG disintegrating tablet Commonly known as: ZOFRAN -ODT Take 1 tablet (4 mg total) by mouth every 8 (eight) hours as needed for nausea or vomiting.   oxybutynin  5 MG 24 hr tablet Commonly known as: DITROPAN -XL Take 1 tablet (5 mg total) by mouth at bedtime.   oxyCODONE  5 MG immediate release tablet Commonly known as: Oxy IR/ROXICODONE  Take 1 tablet (5 mg total) by mouth every 6 (six) hours as needed for severe pain (pain score 7-10). What changed:  when to take this reasons to take this   tamsulosin  0.4 MG Caps capsule Commonly known as: FLOMAX  Take 1 capsule (0.4 mg total)  by mouth daily after supper.        Followup: October 23rd at 2:45 for cysto/stent removal   Follow-up Information     [facility] .   Contact information: [address] [place]  [address] [phone]

## 2024-06-20 NOTE — Progress Notes (Signed)
 Patient is with husband in the room. Refuse to go in a wheel chair, states she needs to walk. Informed of hospital policy. Husband states she is safe to walk down with her.

## 2024-06-20 NOTE — Progress Notes (Signed)
 Urology Consult Follow Up  Subjective: POD # 1  No acute events overnight.  Patient is resting comfortably in bed.  She is tolerating clears.  She had some mild nausea last evening which was resolved with Zofran .  She has been ambulatory.  She is having lower abdominal discomfort which she attributes to the Foley catheter and the JP drain.  VSS afebrile   Her serum creatinine is trending downward and is currently at 1.15.  WBC count 11.1.  Foley in place draining dark yellow urine with good UOP.  JP drain in place draining scant amount of serosanguineous fluid.   Anti-infectives: Anti-infectives (From admission, onward)    Start     Dose/Rate Route Frequency Ordered Stop   06/19/24 0039  ceFAZolin  (ANCEF ) IVPB 2g/100 mL premix        2 g 200 mL/hr over 30 Minutes Intravenous 30 min pre-op 06/19/24 0039 06/19/24 0743       Current Facility-Administered Medications  Medication Dose Route Frequency Provider Last Rate Last Admin   enoxaparin  (LOVENOX ) injection 40 mg  40 mg Subcutaneous Q24H Georganne Penne SAUNDERS, MD       naproxen  (NAPROSYN ) tablet 500 mg  500 mg Oral BID PRN Garren, Brandon R, MD   500 mg at 06/19/24 1953   ondansetron  (ZOFRAN -ODT) disintegrating tablet 4 mg  4 mg Oral Q6H PRN Georganne Penne SAUNDERS, MD       Or   ondansetron  (ZOFRAN ) injection 4 mg  4 mg Intravenous Q6H PRN Georganne Penne SAUNDERS, MD   4 mg at 06/19/24 1756   oxybutynin  (DITROPAN -XL) 24 hr tablet 5 mg  5 mg Oral QHS Garren, Brandon R, MD   5 mg at 06/19/24 2043   oxyCODONE  (Oxy IR/ROXICODONE ) immediate release tablet 5 mg  5 mg Oral Q4H PRN Lory Nowaczyk A, PA-C   5 mg at 06/20/24 0559   tamsulosin  (FLOMAX ) capsule 0.4 mg  0.4 mg Oral QPC supper Georganne Penne SAUNDERS, MD   0.4 mg at 06/19/24 1721     Objective: Vital signs in last 24 hours: Temp:  [97.3 F (36.3 C)-98.7 F (37.1 C)] 97.4 F (36.3 C) (09/24 0254) Pulse Rate:  [55-85] 55 (09/24 0254) Resp:  [10-21] 18 (09/24 0254) BP: (115-167)/(78-121)  131/94 (09/24 0254) SpO2:  [98 %-100 %] 100 % (09/24 0254)  Intake/Output from previous day: 09/23 0701 - 09/24 0700 In: 1220 [P.O.:120; I.V.:1000; IV Piggyback:100] Out: 2410 [Urine:2300; Drains:60; Blood:50] Intake/Output this shift: No intake/output data recorded.   Physical Exam Vitals and nursing note reviewed.  Constitutional:      General: She is not in acute distress.    Appearance: Normal appearance. She is normal weight. She is not ill-appearing, toxic-appearing or diaphoretic.  HENT:     Head: Normocephalic and atraumatic.     Nose: Nose normal.     Mouth/Throat:     Mouth: Mucous membranes are moist.  Eyes:     Extraocular Movements: Extraocular movements intact.     Conjunctiva/sclera: Conjunctivae normal.     Pupils: Pupils are equal, round, and reactive to light.  Pulmonary:     Effort: Pulmonary effort is normal.  Abdominal:     General: Abdomen is flat. There is no distension.     Palpations: There is no mass.     Tenderness: There is abdominal tenderness. There is no right CVA tenderness, left CVA tenderness, guarding or rebound.     Hernia: No hernia is present.     Comments: Abdomen appropriately  tender for postoperative state.  Incisions are clean and dry with no erythema.  JP dressing is dry.  JP drainage scant serosanguineous fluid.  Genitourinary:    Comments: Foley catheter in place draining dark yellow urine. Musculoskeletal:     Cervical back: Normal range of motion.  Neurological:     Mental Status: She is alert.     Lab Results:  Recent Labs    06/19/24 1449 06/20/24 0554  WBC 12.9* 11.1*  HGB 13.7 12.9  HCT 41.2 37.6  PLT 160 141*   BMET Recent Labs    06/19/24 1449 06/20/24 0554  NA  --  135  K  --  3.9  CL  --  104  CO2  --  24  GLUCOSE  --  98  BUN  --  12  CREATININE 1.18* 1.15*  CALCIUM   --  9.1   PT/INR No results for input(s): LABPROT, INR in the last 72 hours. ABG No results for input(s): PHART, HCO3 in  the last 72 hours.  Invalid input(s): PCO2, PO2  Studies/Results: No results found.   Assessment: 33 year old woman who underwent robotic pyeloplasty and left ureteral stent placement yesterday with Dr. Georganne for a left ureteropelvic junction obstruction.   Plan: - Continue to encourage ambulation - Continue to advance diet as tolerated - Discontinue Foley catheter and JP drain this a.m. - Hopefully will be ready for discharge this afternoon   LOS: 0 days    [facility] 06/20/2024

## 2024-06-20 NOTE — Discharge Instructions (Signed)
·   Activity:  You are encouraged to ambulate frequently (about every hour during waking hours) to help prevent blood clots from forming in your legs or lungs.  However, you should not engage in any heavy lifting (> 5-10 lbs), strenuous activity, or straining. ° °· Diet: You should advance your diet as instructed by your physician.  It will be normal to have some bloating, nausea, and abdominal discomfort intermittently. ° °· Prescriptions:  You will be provided a prescription for pain medication to take as needed.  If your pain is not severe enough to require the prescription pain medication, you may take extra strength Tylenol instead which will have less side effects.  You should also take a prescribed stool softener to avoid straining with bowel movements as the prescription pain medication may constipate you. ° °· Incisions: You may remove your dressing bandages 48 hours after surgery if not removed in the hospital.  You will either have some small staples or special tissue glue at each of the incision sites. Once the bandages are removed (if present), the incisions may stay open to air.  You may start showering (but not soaking or bathing in water) the 2nd day after surgery and the incisions simply need to be patted dry after the shower.  No additional care is needed. ° °What to call us about: You should call the office if you develop fever > 101 or develop persistent vomiting, redness or draining around your incision, or any other concerning symptoms.   ° °[facility] °[address], [address] °[place], [place] [address] °[phone] ° ° °

## 2024-06-26 ENCOUNTER — Encounter: Payer: Self-pay | Admitting: Oncology

## 2024-07-12 ENCOUNTER — Other Ambulatory Visit: Payer: Self-pay | Admitting: Urology

## 2024-07-15 NOTE — Progress Notes (Deleted)
   07/19/2024 7:01 PM   Dominique Shea 1991/03/31 [id number]  Cystoscopy Procedure Note:  Indication:  S/p Left robotic pyeloplasty (06/19/24)   After informed consent and discussion of the procedure and its risks, Dominique Shea was positioned and prepped in the standard fashion. Cystoscopy was performed with a flexible cystoscope. The external vaginal anatomy, urethra, bladder neck and bladder mucosa were visualized in a systematic fashion. The ureteral orifices were noted in orthotopic location and orientation. There was a left ureteral stent visualized emanating from the left UO. The stent was controlled with graspers and fully externalized. There were no bladder mucosal lesions, stones, debris or anatomic variants noted. This concluded the procedure.   Imaging: N/A  Findings: Successful Left ureteral stent removal  Assessment and Plan: - PO Bactrim x1 *** - f/u in 6-8 weeks for symptom check with interval Lasix  renogram  [name], MD 07/15/2024

## 2024-07-19 ENCOUNTER — Encounter: Admitting: Urology

## 2024-07-24 ENCOUNTER — Encounter: Payer: Self-pay | Admitting: Urology

## 2024-07-26 NOTE — Progress Notes (Signed)
   08/02/2024 7:59 AM   Dominique Shea 21-Apr-1991 [id number]  Cystoscopy Procedure Note:  Indication:  S/p Left robotic pyeloplasty (06/19/24)   After informed consent and discussion of the procedure and its risks, Dominique Shea was positioned and prepped in the standard fashion. Cystoscopy was performed with a flexible cystoscope. The external vaginal anatomy, urethra, bladder neck and bladder mucosa were visualized in a systematic fashion. The ureteral orifices were noted in orthotopic location and orientation. There was a left ureteral stent visualized emanating from the left UO. The stent was controlled with graspers and fully externalized. There were no bladder mucosal lesions, stones, debris or anatomic variants noted. This concluded the procedure.   Imaging: N/A  Findings: Successful Left ureteral stent removal  Assessment and Plan: - PO Bactrim x1 *** - f/u in 6-8 weeks for symptom check with interval Lasix  renogram  [name], MD 07/26/2024

## 2024-08-02 ENCOUNTER — Ambulatory Visit (INDEPENDENT_AMBULATORY_CARE_PROVIDER_SITE_OTHER): Admitting: Urology

## 2024-08-02 VITALS — BP 158/104 | HR 68

## 2024-08-02 DIAGNOSIS — N135 Crossing vessel and stricture of ureter without hydronephrosis: Secondary | ICD-10-CM

## 2024-08-02 MED ORDER — SULFAMETHOXAZOLE-TRIMETHOPRIM 800-160 MG PO TABS
1.0000 | ORAL_TABLET | Freq: Once | ORAL | Status: AC
  Administered 2024-08-02: 1 via ORAL

## 2024-08-02 NOTE — Addendum Note (Signed)
 Addended by: GEORGANNE PENNE SAUNDERS on: 08/02/2024 04:08 PM   Modules accepted: Orders

## 2024-08-02 NOTE — Patient Instructions (Signed)
 Please contact Central Scheduling to set up your scan at [phone].

## 2024-08-03 ENCOUNTER — Encounter: Payer: Self-pay | Admitting: Radiology

## 2024-08-07 ENCOUNTER — Other Ambulatory Visit: Payer: Self-pay

## 2024-08-07 DIAGNOSIS — N200 Calculus of kidney: Secondary | ICD-10-CM

## 2024-08-09 ENCOUNTER — Ambulatory Visit: Admission: RE | Admit: 2024-08-09 | Source: Ambulatory Visit

## 2024-08-14 ENCOUNTER — Ambulatory Visit
Admission: RE | Admit: 2024-08-14 | Discharge: 2024-08-14 | Disposition: A | Discharge status: Home / Self Care | Source: Ambulatory Visit | Attending: Urology | Admitting: Urology

## 2024-08-14 DIAGNOSIS — N135 Crossing vessel and stricture of ureter without hydronephrosis: Secondary | ICD-10-CM | POA: Insufficient documentation

## 2024-09-11 ENCOUNTER — Other Ambulatory Visit: Payer: Self-pay

## 2024-09-13 ENCOUNTER — Telehealth: Payer: Self-pay

## 2024-09-13 NOTE — Telephone Encounter (Signed)
 Called patient to remind her about her renal imaging and making another appointment. Looks like she no showed her imaging appointment and canceled the appointment before that. I was unable to reach patient. I left a detailed VM and provided her with the number for central scheduling so she can call to set up a new appointment, as well as our number to call back if she had any questions also sent Mychart message as well restating. -Jeannia Tatro,CMA.

## 2024-09-21 NOTE — Assessment & Plan Note (Deleted)
 S/p Left robotic pyeloplasty (06/19/24)   - stent removed 08/02/24   Lasix  renogram

## 2024-09-21 NOTE — Progress Notes (Deleted)
" ° °  09/28/2024 9:15 AM   Dominique Shea [birth year] [id number]  Reason for visit: Follow up Left pyeloplasty   HPI: 33 y.o. female, follow up with me today  S/p Left robotic pyeloplasty (06/19/24)   Lasix  renogram - ***  Prior HPI: S/p Left robotic pyeloplasty (06/19/24)     Physical Exam: There were no vitals taken for this visit.   Constitutional:  Alert and oriented, No acute distress.  Laboratory Data: ***  Pertinent Imaging: I have personally viewed and interpreted the ***.    Assessment & Plan:    Ureteropelvic junction (UPJ) obstruction, left Assessment & Plan: S/p Left robotic pyeloplasty (06/19/24)   - stent removed 08/02/24   Lasix  renogram         Dominique JONELLE Skye, MD  [facility] [address], [address] [place], [place] [address] [phone] "

## 2024-09-28 ENCOUNTER — Ambulatory Visit: Admitting: Urology

## 2024-09-28 DIAGNOSIS — N135 Crossing vessel and stricture of ureter without hydronephrosis: Secondary | ICD-10-CM
